# Patient Record
Sex: Female | Born: 1970
Health system: Southern US, Community
[De-identification: ages and names within clinical notes are randomized; demographics above are authoritative.]

## PROBLEM LIST (undated history)

## (undated) DIAGNOSIS — R768 Other specified abnormal immunological findings in serum: Secondary | ICD-10-CM

## (undated) DIAGNOSIS — K601 Chronic anal fissure: Secondary | ICD-10-CM

## (undated) DIAGNOSIS — J984 Other disorders of lung: Secondary | ICD-10-CM

## (undated) DIAGNOSIS — K219 Gastro-esophageal reflux disease without esophagitis: Secondary | ICD-10-CM

## (undated) DIAGNOSIS — N9489 Other specified conditions associated with female genital organs and menstrual cycle: Secondary | ICD-10-CM

## (undated) DIAGNOSIS — R7303 Prediabetes: Secondary | ICD-10-CM

## (undated) DIAGNOSIS — F419 Anxiety disorder, unspecified: Secondary | ICD-10-CM

## (undated) DIAGNOSIS — D649 Anemia, unspecified: Secondary | ICD-10-CM

## (undated) DIAGNOSIS — K602 Anal fissure, unspecified: Secondary | ICD-10-CM

## (undated) DIAGNOSIS — R0989 Other specified symptoms and signs involving the circulatory and respiratory systems: Secondary | ICD-10-CM

## (undated) DIAGNOSIS — R634 Abnormal weight loss: Secondary | ICD-10-CM

## (undated) DIAGNOSIS — Z87442 Personal history of urinary calculi: Secondary | ICD-10-CM

## (undated) DIAGNOSIS — F32A Depression, unspecified: Secondary | ICD-10-CM

## (undated) DIAGNOSIS — K645 Perianal venous thrombosis: Secondary | ICD-10-CM

## (undated) DIAGNOSIS — J432 Centrilobular emphysema: Secondary | ICD-10-CM

## (undated) DIAGNOSIS — F410 Panic disorder [episodic paroxysmal anxiety] without agoraphobia: Secondary | ICD-10-CM

## (undated) DIAGNOSIS — J45909 Unspecified asthma, uncomplicated: Secondary | ICD-10-CM

## (undated) DIAGNOSIS — K648 Other hemorrhoids: Secondary | ICD-10-CM

## (undated) DIAGNOSIS — D259 Leiomyoma of uterus, unspecified: Secondary | ICD-10-CM

## (undated) DIAGNOSIS — K649 Unspecified hemorrhoids: Secondary | ICD-10-CM

## (undated) HISTORY — PX: SIGMOIDOSCOPY: SUR1295

## (undated) HISTORY — PX: ADENOIDECTOMY: SUR15

---

## 2000-07-29 ENCOUNTER — Emergency Department (HOSPITAL_COMMUNITY): Admission: EM | Admit: 2000-07-29 | Discharge: 2000-07-29 | Payer: Self-pay | Admitting: Emergency Medicine

## 2005-06-24 ENCOUNTER — Ambulatory Visit: Payer: Self-pay | Admitting: Internal Medicine

## 2007-11-22 ENCOUNTER — Ambulatory Visit: Payer: Self-pay | Admitting: Internal Medicine

## 2010-08-06 ENCOUNTER — Ambulatory Visit: Payer: Self-pay | Admitting: Internal Medicine

## 2014-09-21 DIAGNOSIS — J449 Chronic obstructive pulmonary disease, unspecified: Secondary | ICD-10-CM

## 2014-09-21 DIAGNOSIS — O009 Unspecified ectopic pregnancy without intrauterine pregnancy: Secondary | ICD-10-CM

## 2014-09-21 HISTORY — DX: Chronic obstructive pulmonary disease, unspecified: J44.9

## 2014-09-21 HISTORY — DX: Unspecified ectopic pregnancy without intrauterine pregnancy: O00.90

## 2014-10-04 ENCOUNTER — Ambulatory Visit: Payer: Self-pay | Admitting: Oncology

## 2014-10-22 ENCOUNTER — Ambulatory Visit: Payer: Self-pay | Admitting: Oncology

## 2015-03-12 ENCOUNTER — Encounter: Payer: Self-pay | Admitting: Unknown Physician Specialty

## 2015-03-12 ENCOUNTER — Ambulatory Visit (INDEPENDENT_AMBULATORY_CARE_PROVIDER_SITE_OTHER): Payer: BLUE CROSS/BLUE SHIELD | Admitting: Unknown Physician Specialty

## 2015-03-12 VITALS — BP 98/64 | HR 76 | Temp 98.3°F | Ht 66.6 in | Wt 122.2 lb

## 2015-03-12 DIAGNOSIS — J0100 Acute maxillary sinusitis, unspecified: Secondary | ICD-10-CM

## 2015-03-12 MED ORDER — AMOXICILLIN 875 MG PO TABS: 875.0000 mg | ORAL_TABLET | Freq: Two times a day (BID) | ORAL | Status: DC

## 2015-03-12 NOTE — Progress Notes (Signed)
   BP 98/64 mmHg  Pulse 76  Temp(Src) 98.3 F (36.8 C)  Ht 5' 6.6" (1.692 m)  Wt 122 lb 3.2 oz (55.43 kg)  BMI 19.36 kg/m2  SpO2 99%   Subjective:    Patient ID: Lori Davies, female    DOB: [birth year], 44 y.o.   MRN: [id number]  HPI: Lori Davies is a 44 y.o. female  Chief Complaint  Patient presents with  . Facial Pain  . Sore Throat  . Sinusitis  . Nasal Congestion   Sore Throat  Associated symptoms include congestion and coughing. Pertinent negatives include no abdominal pain, diarrhea or ear pain.  Sinusitis Associated symptoms include congestion, coughing and a sore throat. Pertinent negatives include no ear pain.  URI  This is a new problem. The current episode started in the past 7 days. The problem has been gradually worsening. The maximum temperature recorded prior to her arrival was 101 - 101.9 F. The fever has been present for less than 1 day. Associated symptoms include congestion, coughing, nausea, a rash, sinus pain and a sore throat. Pertinent negatives include no abdominal pain, chest pain, diarrhea, dysuria or ear pain. She has tried NSAIDs for the symptoms. The treatment provided mild relief.     Relevant past medical, surgical, family and social history reviewed and updated as indicated. Interim medical history since our last visit reviewed. Allergies and medications reviewed and updated.  Review of Systems  HENT: Positive for congestion and sore throat. Negative for ear pain.   Respiratory: Positive for cough.   Cardiovascular: Negative for chest pain.  Gastrointestinal: Positive for nausea. Negative for abdominal pain and diarrhea.  Genitourinary: Negative for dysuria.  Skin: Positive for rash.    Per HPI unless specifically indicated above     Objective:    BP 98/64 mmHg  Pulse 76  Temp(Src) 98.3 F (36.8 C)  Ht 5' 6.6" (1.692 m)  Wt 122 lb 3.2 oz (55.43 kg)  BMI 19.36 kg/m2  SpO2 99%  Wt Readings from Last 3 Encounters:  03/12/15  122 lb 3.2 oz (55.43 kg)  01/17/15 118 lb (53.524 kg)    Physical Exam  Constitutional: She is oriented to person, place, and time. She appears well-developed and well-nourished. No distress.  HENT:  Head: Normocephalic and atraumatic.  Eyes: Conjunctivae and lids are normal. Right eye exhibits no discharge. Left eye exhibits no discharge. No scleral icterus.  Cardiovascular: Normal rate and regular rhythm.   Pulmonary/Chest: Effort normal. No respiratory distress.  Abdominal: Normal appearance. There is no splenomegaly or hepatomegaly.  Musculoskeletal: Normal range of motion.  Neurological: She is alert and oriented to person, place, and time.  Skin: Skin is intact. No rash noted. No pallor.  Psychiatric: She has a normal mood and affect. Her behavior is normal. Judgment and thought content normal.    No results found for this or any previous visit.    Assessment & Plan:   Problem List Items Addressed This Visit    None    Visit Diagnoses    Acute maxillary sinusitis, recurrence not specified    -  Primary    Relevant Medications    amoxicillin (AMOXIL) 875 MG tablet        Follow up plan: Return if symptoms worsen or fail to improve.

## 2015-03-12 NOTE — Patient Instructions (Signed)
You have a sinus infection. Take medicine as prescribed: Push fluids and plenty of rest. Nasal saline irrigation or neti pot to help drain sinuses. May use plain mucinex with plenty of fluid to help mobilize mucous. Please let us know if fever >101.5, trouble opening/closing mouth, difficulty swallowing, or worsening instead of improving as expected. Sinusitis Sinusitis is redness, soreness, and inflammation of the paranasal sinuses. Paranasal sinuses are air pockets within the bones of your face (beneath the eyes, the middle of the forehead, or above the eyes). In healthy paranasal sinuses, mucus is able to drain out, and air is able to circulate through them by way of your nose. However, when your paranasal sinuses are inflamed, mucus and air can become trapped. This can allow bacteria and other germs to grow and cause infection. Sinusitis can develop quickly and last only a short time (acute) or continue over a long period (chronic). Sinusitis that lasts for more than 12 weeks is considered chronic.  CAUSES  Causes of sinusitis include:  Allergies.  Structural abnormalities, such as displacement of the cartilage that separates your nostrils (deviated septum), which can decrease the air flow through your nose and sinuses and affect sinus drainage.  Functional abnormalities, such as when the small hairs (cilia) that line your sinuses and help remove mucus do not work properly or are not present. SIGNS AND SYMPTOMS  Symptoms of acute and chronic sinusitis are the same. The primary symptoms are pain and pressure around the affected sinuses. Other symptoms include:  Upper toothache.  Earache.  Headache.  Bad breath.  Decreased sense of smell and taste.  A cough, which worsens when you are lying flat.  Fatigue.  Fever.  Thick drainage from your nose, which often is green and may contain pus (purulent).  Swelling and warmth over the affected sinuses. DIAGNOSIS  Your health care  provider will perform a physical exam. During the exam, your health care provider may:  Look in your nose for signs of abnormal growths in your nostrils (nasal polyps).  Tap over the affected sinus to check for signs of infection.  View the inside of your sinuses (endoscopy) using an imaging device that has a light attached (endoscope). If your health care provider suspects that you have chronic sinusitis, one or more of the following tests may be recommended:  Allergy tests.  Nasal culture. A sample of mucus is taken from your nose, sent to a lab, and screened for bacteria.  Nasal cytology. A sample of mucus is taken from your nose and examined by your health care provider to determine if your sinusitis is related to an allergy. TREATMENT  Most cases of acute sinusitis are related to a viral infection and will resolve on their own within 10 days. Sometimes medicines are prescribed to help relieve symptoms (pain medicine, decongestants, nasal steroid sprays, or saline sprays).  However, for sinusitis related to a bacterial infection, your health care provider will prescribe antibiotic medicines. These are medicines that will help kill the bacteria causing the infection.  Rarely, sinusitis is caused by a fungal infection. In theses cases, your health care provider will prescribe antifungal medicine. For some cases of chronic sinusitis, surgery is needed. Generally, these are cases in which sinusitis recurs more than 3 times per year, despite other treatments. HOME CARE INSTRUCTIONS   Drink plenty of water. Water helps thin the mucus so your sinuses can drain more easily.  Use a humidifier.  Inhale steam 3 to 4 times a day (for  example, sit in the bathroom with the shower running).  Apply a warm, moist washcloth to your face 3 to 4 times a day, or as directed by your health care provider.  Use saline nasal sprays to help moisten and clean your sinuses.  Take medicines only as directed by  your health care provider.  If you were prescribed either an antibiotic or antifungal medicine, finish it all even if you start to feel better. SEEK IMMEDIATE MEDICAL CARE IF:  You have increasing pain or severe headaches.  You have nausea, vomiting, or drowsiness.  You have swelling around your face.  You have vision problems.  You have a stiff neck.  You have difficulty breathing. MAKE SURE YOU:   Understand these instructions.  Will watch your condition.  Will get help right away if you are not doing well or get worse. Document Released: 09/07/2005 Document Revised: 01/22/2014 Document Reviewed: 09/22/2011 [facility] Patient Information 2015 [place], [place]. This information is not intended to replace advice given to you by your health care provider. Make sure you discuss any questions you have with your health care provider.

## 2015-06-05 ENCOUNTER — Encounter: Payer: Self-pay | Admitting: Family Medicine

## 2015-06-05 ENCOUNTER — Ambulatory Visit (INDEPENDENT_AMBULATORY_CARE_PROVIDER_SITE_OTHER): Payer: BLUE CROSS/BLUE SHIELD | Admitting: Family Medicine

## 2015-06-05 VITALS — BP 95/63 | HR 75 | Temp 98.1°F | Ht 67.0 in | Wt 126.0 lb

## 2015-06-05 DIAGNOSIS — R0789 Other chest pain: Secondary | ICD-10-CM | POA: Diagnosis not present

## 2015-06-05 DIAGNOSIS — J209 Acute bronchitis, unspecified: Secondary | ICD-10-CM

## 2015-06-05 MED ORDER — AMOXICILLIN 875 MG PO TABS: 875.0000 mg | ORAL_TABLET | Freq: Two times a day (BID) | ORAL | Status: DC

## 2015-06-05 MED ORDER — ALBUTEROL SULFATE HFA 108 (90 BASE) MCG/ACT IN AERS: 2.0000 | INHALATION_SPRAY | RESPIRATORY_TRACT | Status: DC | PRN

## 2015-06-05 MED ORDER — PREDNISONE 20 MG PO TABS: ORAL_TABLET | ORAL | Status: DC

## 2015-06-05 NOTE — Progress Notes (Signed)
BP 95/63 mmHg  Pulse 75  Temp(Src) 98.1 F (36.7 C)  Ht 5\' 7"  (1.702 m)  Wt 126 lb (57.153 kg)  BMI 19.73 kg/m2  SpO2 98%   Subjective:    Patient ID: Lori Davies, female    DOB: 1971/08/15, 44 y.o.   MRN: [id number]  HPI: Lori Davies is a 44 y.o. female  Chief Complaint  Patient presents with  . Chest Pain    heavy with SOB, hears wheezing, throat burns   She started to get sick 3 days ago; does bring some phlegm up sometimes; bittery taste; no blood Shortness of breath, a lot; heard wheezing No childhood asthma; no pneumonia; no rash, no sore throat, little irritated; ears are okay; sinus are congested She has usually been helped by prednisone and antibiotics She has had to use Mother-in-law has been sick with similar symptoms Patient has not had any fevers; the last 3 days, she has been pouring sweat, might have been running in the middle of hte night Does not tolerate doxycycline, nausea Augmentin makes her sick too z-pak does not help patient She does not think she's having a heart attack; just has this tightness in her chest; pulling her shoulders back feels a little better, opens her up; no risk factors for MI; does not have diabetes, high cholesterol, hypertension; remote smoking history  Relevant past medical, surgical, family and social history reviewed and updated as indicated. Interim medical history since our last visit reviewed. Allergies and medications reviewed and updated.  Review of Systems  Per HPI unless specifically indicated above     Objective:    BP 95/63 mmHg  Pulse 75  Temp(Src) 98.1 F (36.7 C)  Ht 5\' 7"  (1.702 m)  Wt 126 lb (57.153 kg)  BMI 19.73 kg/m2  SpO2 98%  Wt Readings from Last 3 Encounters:  06/05/15 126 lb (57.153 kg)  03/12/15 122 lb 3.2 oz (55.43 kg)  01/17/15 118 lb (53.524 kg)    Physical Exam  Constitutional: She appears well-developed and well-nourished. No distress.  HENT:  Head: Normocephalic and  atraumatic.  Right Ear: Hearing, external ear and ear canal normal. Tympanic membrane is not injected and not erythematous. No middle ear effusion.  Left Ear: Hearing, tympanic membrane, external ear and ear canal normal. Tympanic membrane is not injected and not erythematous.  No middle ear effusion.  Nose: No rhinorrhea or nasal deformity.  Mouth/Throat: Mucous membranes are normal. Posterior oropharyngeal erythema (mild) present. No oropharyngeal exudate or posterior oropharyngeal edema.  Eyes: EOM are normal. No scleral icterus.  Neck: No thyromegaly present.  Cardiovascular: Normal rate, regular rhythm and normal heart sounds.   No murmur heard. Pulmonary/Chest: Effort normal. No accessory muscle usage. No respiratory distress. She has decreased breath sounds (mildly decreased breath sounds, symmetric). She has wheezes (faint expiratory wheezes). She exhibits tenderness and bony tenderness. She exhibits no deformity and no retraction.  Abdominal: Soft. Bowel sounds are normal. She exhibits no distension.  Musculoskeletal: Normal range of motion. She exhibits no edema.  Lymphadenopathy:    She has no cervical adenopathy.    She has no axillary adenopathy.  Neurological: She is alert. She exhibits normal muscle tone.  Skin: Skin is warm and dry. No rash noted. She is not diaphoretic. No pallor.  Psychiatric: She has a normal mood and affect. Her behavior is normal. Judgment and thought content normal.    No results found for this or any previous visit.    Assessment &  Plan:   Problem List Items Addressed This Visit      Respiratory   Bronchitis, acute, with bronchospasm - Primary    will treat with prednisone, antibiotics, SABA; rest, hydration, etc, see AVS; opted again CXR to reduce lifetime radiation exposure        Other   Chest pain, atypical    tender to pressure along anterior chest wall; sounds completely related to lung disease; see above          Follow up  plan: Return if symptoms worsen or fail to improve.

## 2015-06-05 NOTE — Assessment & Plan Note (Signed)
will treat with prednisone, antibiotics, SABA; rest, hydration, etc, see AVS; opted again CXR to reduce lifetime radiation exposure

## 2015-06-05 NOTE — Assessment & Plan Note (Signed)
tender to pressure along anterior chest wall; sounds completely related to lung disease; see above

## 2015-06-05 NOTE — Patient Instructions (Signed)
Let's start the prednisone and antibiotic and inhaler Please do eat yogurt daily or take a probiotic daily for the next month or two We want to replace the healthy germs in the gut If you notice foul, watery diarrhea in the next two months, schedule an appointment RIGHT AWAY Try vitamin C (orange juice if not diabetic or vitamin C tablets) and drink green tea to help your immune system during your illness Get plenty of rest and hydration Call if needed

## 2015-06-06 ENCOUNTER — Telehealth: Payer: Self-pay | Admitting: Family Medicine

## 2015-06-06 NOTE — Telephone Encounter (Signed)
Called by call a nurse, and called patient back to discuss symptoms. She notes that she started her prednisone today, felt really sick on her stomach. Then a couple of hours later, had numbness in both her hands then had tingling sensation in both her hands that went up her arms to her elbows- no further. She feels like it has gotten better over the past couple of hours, but it's still there. She feels like if she grips, the numbness goes away, but then if she sits normally it starts to come back. She notes that it has been going on for 2.5 hours now, starting to get better. No SOB, no rashes, No CP, No visual changes, No lightheadedness. She is otherwise feeling well and doing much better with her breathing from yesterday. Advised patient of warning signs including symptoms getting worse for which she should call back and go to the ER. She thinks that it is a side effect of the prednisone, and I agree. Will monitor, and check back in in AM. Does not need to go to the ER at this moment. Will call back tomorrow AM to see how she is doing.

## 2015-06-07 NOTE — Telephone Encounter (Signed)
Numbness and tingling seemed like it disappeared when she used her inhaler last night and she is better now. Felt so much better with the prednisone in her chest. Wants to know if she can take 20mg  spread out over a couple of hours over the day to avoid reaction and open chest. Advised her to take 20mg , if no reaction OK to take another 20, but if numbness and tingling returns, do not take another one. She is aware and will call with any other concerns.

## 2015-06-07 NOTE — Telephone Encounter (Signed)
Pt called stated she did not like the side effects of the medication but notices the tightness in her chest returns when she does not take the prednisone. Pt would like to know if it would be ok to take one pill in the morning, afternoon, and evening rather than taking the 3 pills together. Please contact the pt to advise. Thanks.

## 2015-06-07 NOTE — Telephone Encounter (Signed)
FYI- called by your patient last night and followed up this AM

## 2015-06-12 ENCOUNTER — Ambulatory Visit
Admission: RE | Admit: 2015-06-12 | Discharge: 2015-06-12 | Disposition: A | Discharge status: Home / Self Care | Payer: BLUE CROSS/BLUE SHIELD | Source: Ambulatory Visit | Attending: Family Medicine | Admitting: Family Medicine

## 2015-06-12 ENCOUNTER — Other Ambulatory Visit
Admission: RE | Admit: 2015-06-12 | Discharge: 2015-06-12 | Disposition: A | Discharge status: Home / Self Care | Payer: BLUE CROSS/BLUE SHIELD | Source: Ambulatory Visit | Attending: Family Medicine | Admitting: Family Medicine

## 2015-06-12 ENCOUNTER — Ambulatory Visit: Payer: BLUE CROSS/BLUE SHIELD

## 2015-06-12 ENCOUNTER — Ambulatory Visit (INDEPENDENT_AMBULATORY_CARE_PROVIDER_SITE_OTHER): Payer: BLUE CROSS/BLUE SHIELD | Admitting: Family Medicine

## 2015-06-12 ENCOUNTER — Encounter: Payer: Self-pay | Admitting: Family Medicine

## 2015-06-12 VITALS — BP 105/69 | HR 65 | Temp 98.8°F | Wt 127.0 lb

## 2015-06-12 DIAGNOSIS — R499 Unspecified voice and resonance disorder: Secondary | ICD-10-CM

## 2015-06-12 DIAGNOSIS — R0600 Dyspnea, unspecified: Secondary | ICD-10-CM | POA: Diagnosis not present

## 2015-06-12 DIAGNOSIS — J029 Acute pharyngitis, unspecified: Secondary | ICD-10-CM | POA: Diagnosis not present

## 2015-06-12 DIAGNOSIS — R0789 Other chest pain: Secondary | ICD-10-CM | POA: Insufficient documentation

## 2015-06-12 DIAGNOSIS — J209 Acute bronchitis, unspecified: Secondary | ICD-10-CM

## 2015-06-12 LAB — CBC WITH DIFFERENTIAL/PLATELET
Hematocrit: 43.6 % (ref 34.0–46.6)
Hemoglobin: 15.3 g/dL (ref 11.1–15.9)
LYMPHS: 26 %
Lymphocytes Absolute: 3.4 10*3/uL — ABNORMAL HIGH (ref 0.7–3.1)
MCH: 30.7 pg (ref 26.6–33.0)
MCHC: 35.1 g/dL (ref 31.5–35.7)
MCV: 88 fL (ref 79–97)
MID (Absolute): 0.9 10*3/uL (ref 0.1–1.6)
MID: 7 %
NEUTROS ABS: 8.7 10*3/uL — AB (ref 1.4–7.0)
NEUTROS PCT: 68 %
Platelets: 360 10*3/uL (ref 150–379)
RBC: 4.98 x10E6/uL (ref 3.77–5.28)
RDW: 13.3 % (ref 12.3–15.4)
WBC: 13 10*3/uL — ABNORMAL HIGH (ref 3.4–10.8)

## 2015-06-12 LAB — FIBRIN DERIVATIVES D-DIMER (ARMC ONLY): Fibrin derivatives D-dimer (ARMC): 210.39 (ref 0–499)

## 2015-06-12 MED ORDER — MONTELUKAST SODIUM 10 MG PO TABS: 10.0000 mg | ORAL_TABLET | Freq: Every day | ORAL | Status: DC

## 2015-06-12 MED ORDER — FEXOFENADINE HCL 180 MG PO TABS: 180.0000 mg | ORAL_TABLET | Freq: Every day | ORAL | Status: DC

## 2015-06-12 MED ORDER — OMEPRAZOLE 40 MG PO CPDR
40.0000 mg | DELAYED_RELEASE_CAPSULE | Freq: Every day | ORAL | Status: DC
Start: 2015-06-12 — End: 2015-10-30

## 2015-06-12 MED ORDER — FLUCONAZOLE 150 MG PO TABS: 150.0000 mg | ORAL_TABLET | Freq: Once | ORAL | Status: DC

## 2015-06-12 NOTE — Assessment & Plan Note (Addendum)
Chest xray and EKG ordered today; she did not respond to other therapy (amoxicillin, SABA, and prednisone)

## 2015-06-12 NOTE — Progress Notes (Signed)
BP 105/69 mmHg  Pulse 65  Temp(Src) 98.8 F (37.1 C)  Wt 127 lb (57.607 kg)  SpO2 97%  Subjective:    Patient ID: Lori Davies, female    DOB: May 02, 1971, 44 y.o.   MRN: [id number]  HPI: Lori Davies is a 44 y.o. female  Chief Complaint  Patient presents with  . URI    still feels like she "can't breathe" and that her chest is tight. Also states she feels like there is something in her throat even though she knows nothing is in there.   She says her chest is still stopped up and she is having to gasp to breathe; her throat is really irritated; she does not know why, not better She has not been on long car rides or plane trips; no hx of DVT or PE She is coughing, but nothing coming up Does feel some pressure; if she gets up and walks for a long distance then she does feel pressure; feels like something is taking  She took prednisone, albuterol, and amoxicillin She had some pins and needles running through her arms; she spaced the prednisone apart and that helped; she has one left, but did not resolve the problem No nausea No fevers Having night sweats, wakes up during the night sweating Throat is almost really really irritated; she declines strep testing; has already been treated with amoxicillin No change in voice She used to smoke, quit a few weeks ago Aunt had blood clot; son has allergies Mother has thyroid issues Dog in the house, in the bed with her; that is relatively new; no cats  Relevant past medical, surgical, family and social history reviewed and updated as indicated. Interim medical history since our last visit reviewed. Allergies and medications reviewed and updated.  Review of Systems Per HPI unless specifically indicated above     Objective:    BP 105/69 mmHg  Pulse 65  Temp(Src) 98.8 F (37.1 C)  Wt 127 lb (57.607 kg)  SpO2 97%  Wt Readings from Last 3 Encounters:  06/12/15 127 lb (57.607 kg)  06/05/15 126 lb (57.153 kg)  03/12/15 122 lb 3.2 oz  (55.43 kg)    Physical Exam  Constitutional: She appears well-developed and well-nourished. No distress.  HENT:  Head: Normocephalic and atraumatic.  Eyes: EOM are normal. No scleral icterus.  Neck: Neck supple. No JVD present. No tracheal tenderness present. No tracheal deviation present. No thyroid mass and no thyromegaly present.  Cardiovascular: Normal rate, regular rhythm and normal heart sounds.   No extrasystoles are present. Exam reveals no gallop and no distant heart sounds.   No murmur heard. Pulmonary/Chest: Effort normal and breath sounds normal. No accessory muscle usage or stridor. No respiratory distress. She has no decreased breath sounds. She has no wheezes. She has no rhonchi. She has no rales. She exhibits no deformity and no retraction.  Abdominal: Soft. Bowel sounds are normal. She exhibits no distension.  Musculoskeletal: Normal range of motion. She exhibits no edema (negative Homan's).  Neurological: She is alert. She exhibits normal muscle tone.  Skin: Skin is warm and dry. She is not diaphoretic. No pallor.  Psychiatric: She has a normal mood and affect. Her behavior is normal. Judgment and thought content normal.   No results found for this or any previous visit.    Assessment & Plan:   Problem List Items Addressed This Visit      Respiratory   Bronchitis, acute, with bronchospasm    Chest  xray and EKG ordered today; she did not respond to other therapy (amoxicillin, SABA, and prednisone)      Relevant Orders   CBC With Differential/Platelet   Spirometry with graph (Completed)     Other   Chest pain, atypical - Primary    EKG today did not show anything worrisome; NSR, normal axis, no ST-T wave changes; no S3Q3T3 pattern to suggest PE; will get D-dimer chest xray      Relevant Orders   EKG 12-Lead (Completed)   CBC With Differential/Platelet   Basic metabolic panel   CT Angio Chest PE W/Cm &/Or Wo Cm   Allergen, Dog Dander, e5   CT Angio Chest PE  W/Cm &/Or Wo Cm   DG Chest 2 View   D-dimer, quantitative (not at [facility])   Dyspnea    Will get stat D-dimer, CXR; did not respond to prednisone; ddx still includes allergic response; will get allergen panel, IgE; consider singulair plus inhaled corticosteroid plus antihistamine once CXR and D-dimer are back      Relevant Orders   Allergen, Dog Dander, e5   CT Angio Chest PE W/Cm &/Or Wo Cm   Allergen Panel (27) + IGE   DG Chest 2 View   D-dimer, quantitative (not at [facility])   Spirometry with graph (Completed)    Other Visit Diagnoses    Change in voice        Relevant Orders    TSH    Allergen, Dog Dander, e5    CT Angio Chest PE W/Cm &/Or Wo Cm    Allergen Panel (27) + IGE    DG Chest 2 View       Follow up plan: No Follow-up on file.  Ardyth Gal, nurse reviewer; he will not approve the chest CT "challenging" to approve; will need to get D-dimer I am sending patient to the hospital for a stat D-dimer and CXR, with call report to me Discussed case with Dr. Wynetta Emery as well; she reviewed spirometry, showing mild restriction Consider possible candidal infection as cause of sore throat; will treat with single dose of Diflucan If symptoms persist, consider echo and/or referral to pulmonologist Face-to-face time with patient was more than 40 minutes, >50% time spent counseling and coordination of care

## 2015-06-12 NOTE — Assessment & Plan Note (Addendum)
EKG today did not show anything worrisome; NSR, normal axis, no ST-T wave changes; no S3Q3T3 pattern to suggest PE; will get D-dimer chest xray

## 2015-06-12 NOTE — Assessment & Plan Note (Signed)
Will get stat D-dimer, CXR; did not respond to prednisone; ddx still includes allergic response; will get allergen panel, IgE; consider singulair plus inhaled corticosteroid plus antihistamine once CXR and D-dimer are back

## 2015-06-12 NOTE — Patient Instructions (Addendum)
We will get the labs and xray of your chest today to look for a cause of your symptoms We'll talk by phone about the results; do not leave the hospital until we talk We'll have more information back tomorrow as well with lab results Follow-up for your symptoms on Monday, but call sooner if needed Seek medical care at the ER or urgent care at night or over the weekend if your symptoms become severe Avoid all passive smoke Keep the dog out of the bedroom, and change sheets

## 2015-06-13 LAB — TSH: TSH: 2.99 u[IU]/mL (ref 0.450–4.500)

## 2015-06-13 LAB — BASIC METABOLIC PANEL
BUN / CREAT RATIO: 15 (ref 9–23)
BUN: 12 mg/dL (ref 6–24)
CO2: 21 mmol/L (ref 18–29)
CREATININE: 0.82 mg/dL (ref 0.57–1.00)
Calcium: 9.9 mg/dL (ref 8.7–10.2)
Chloride: 102 mmol/L (ref 97–108)
GFR, EST AFRICAN AMERICAN: 100 mL/min/{1.73_m2} (ref >59)
GFR, EST NON AFRICAN AMERICAN: 87 mL/min/{1.73_m2} (ref >59)
GLUCOSE: 87 mg/dL (ref 65–99)
Potassium: 3.9 mmol/L (ref 3.5–5.2)
SODIUM: 141 mmol/L (ref 134–144)

## 2015-06-15 LAB — ALLERGEN PANEL (27) + IGE
Bahia Grass IgE: 5.1 kU/L — AB
Bermuda Grass IgE: 1.7 kU/L — AB
Cockroach, American IgE: <0.1 kU/L
Common Silver Birch IgE: <0.1 kU/L
D Farinae IgE: <0.1 kU/L
D Pteronyssinus IgE: <0.1 kU/L
Dog Dander IgE: 0.49 kU/L — AB
Elm, American IgE: <0.1 kU/L
G008-IGE BLUEGRASS, KENTUCK: 11.4 kU/L — AB
Hickory, White IgE: <0.1 kU/L
IgE (Immunoglobulin E), Serum: 126 IU/mL — ABNORMAL HIGH (ref 0–100)
Johnson Grass IgE: 2.74 kU/L — AB
Maple/Box Elder IgE: <0.1 kU/L
Oak, White IgE: <0.1 kU/L
Penicillium Chrysogen IgE: <0.1 kU/L
Pigweed, Rough IgE: <0.1 kU/L
Ragweed, Short IgE: <0.1 kU/L
TIMOTHY IGE: 7.61 kU/L — AB

## 2015-06-17 ENCOUNTER — Ambulatory Visit (INDEPENDENT_AMBULATORY_CARE_PROVIDER_SITE_OTHER): Payer: BLUE CROSS/BLUE SHIELD | Admitting: Family Medicine

## 2015-06-17 ENCOUNTER — Encounter: Payer: Self-pay | Admitting: Family Medicine

## 2015-06-17 ENCOUNTER — Other Ambulatory Visit: Payer: BLUE CROSS/BLUE SHIELD

## 2015-06-17 VITALS — BP 105/72 | HR 78 | Temp 98.3°F | Wt 129.0 lb

## 2015-06-17 DIAGNOSIS — J984 Other disorders of lung: Secondary | ICD-10-CM

## 2015-06-17 DIAGNOSIS — R0989 Other specified symptoms and signs involving the circulatory and respiratory systems: Secondary | ICD-10-CM | POA: Insufficient documentation

## 2015-06-17 DIAGNOSIS — R0600 Dyspnea, unspecified: Secondary | ICD-10-CM | POA: Diagnosis not present

## 2015-06-17 DIAGNOSIS — R760 Raised antibody titer: Secondary | ICD-10-CM | POA: Diagnosis not present

## 2015-06-17 DIAGNOSIS — R768 Other specified abnormal immunological findings in serum: Secondary | ICD-10-CM | POA: Insufficient documentation

## 2015-06-17 DIAGNOSIS — J3081 Allergic rhinitis due to animal (cat) (dog) hair and dander: Secondary | ICD-10-CM | POA: Diagnosis not present

## 2015-06-17 MED ORDER — FLUTICASONE PROPIONATE HFA 110 MCG/ACT IN AERO: 2.0000 | INHALATION_SPRAY | Freq: Two times a day (BID) | RESPIRATORY_TRACT | Status: DC

## 2015-06-17 NOTE — Assessment & Plan Note (Signed)
Continue anti-histamine and montelukast; considered referral to allergy/immunology but I believe we can get her in to see pulmonologist faster and she has restrictive pattern on spirometry and hyperinflation of lungs on CXR so I want her to see pulmonologist anyway; AVOID triggers; glad to hear dog is out of the bedroom and she has changed sheets

## 2015-06-17 NOTE — Assessment & Plan Note (Addendum)
Improved but still persistent; refer to pulmonologist; breathing treatment (albuterol) given here in the office at patient's request; reviewed negative D-dimer, CXR which showed hyperinflation of lungs and apical scarring; previous spirometry showed restrictive lung pattern with advanced lung age; discussed getting an echocardiogram of the heart with patient and will order today

## 2015-06-17 NOTE — Assessment & Plan Note (Signed)
On spirometry, but with hyperinflation of lungs on CXR; refer to pulmonologist

## 2015-06-17 NOTE — Assessment & Plan Note (Signed)
Continue montelukast and anti-histamine; refer to pulmonologist

## 2015-06-17 NOTE — Progress Notes (Signed)
BP 105/72 mmHg  Pulse 78  Temp(Src) 98.3 F (36.8 C)  Wt 129 lb (58.514 kg)  SpO2 99%   Subjective:    Patient ID: Lori Davies, female    DOB: March 16, 1971, 44 y.o.   MRN: [id number]  HPI: Lori Davies is a 44 y.o. female  Chief Complaint  Patient presents with  . Follow-up    Still having some SOB with activity   She was over at her mom's and went back up two short flights of steps and got very short of breath;  Sheets have been washed; dog is out of the bed She is taking Allegra; taking montelukast Not having itchy watery eyes She took the diflucan; throat is not sore any more; still feels like something is in there She would really like to try a breathing treatment  Relevant past medical, surgical, family and social history reviewed and updated as indicated. Interim medical history since our last visit reviewed. Allergies and medications reviewed and updated.  Review of Systems Per HPI unless specifically indicated above     Objective:    BP 105/72 mmHg  Pulse 78  Temp(Src) 98.3 F (36.8 C)  Wt 129 lb (58.514 kg)  SpO2 99%  Wt Readings from Last 3 Encounters:  06/17/15 129 lb (58.514 kg)  06/12/15 127 lb (57.607 kg)  06/05/15 126 lb (57.153 kg)    Physical Exam  Constitutional: She appears well-developed and well-nourished.  HENT:  Head: Normocephalic and atraumatic.  Eyes: EOM are normal. No scleral icterus.  Neck: Neck supple. No JVD present. No tracheal deviation present. No thyromegaly present.  Cardiovascular: Normal rate and regular rhythm.   No extrasystoles are present.  Pulmonary/Chest: Effort normal and breath sounds normal. No accessory muscle usage or stridor. No tachypnea. No respiratory distress. She has no decreased breath sounds. She has no wheezes. She has no rhonchi. She has no rales.  Musculoskeletal: She exhibits no edema.  No calf swelling or tenderness  Lymphadenopathy:    She has no cervical adenopathy.  Skin: No rash noted. No  erythema.  No pallor, no jaundice  Psychiatric: She has a normal mood and affect. Her speech is normal and behavior is normal. Thought content normal.    Results for orders placed or performed during the hospital encounter of 06/12/15  Fibrin derivatives D-Dimer ([facility] only)  Result Value Ref Range   Fibrin derivatives D-dimer ([facility]) 210.39 0 - 499       Assessment & Plan:   Problem List Items Addressed This Visit      Respiratory   Restrictive lung disease    On spirometry, but with hyperinflation of lungs on CXR; refer to pulmonologist      Relevant Orders   Ambulatory referral to Pulmonology     Other   Dyspnea    Improved but still persistent; refer to pulmonologist; breathing treatment (albuterol) given here in the office at patient's request; reviewed negative D-dimer, CXR which showed hyperinflation of lungs and apical scarring; previous spirometry showed restrictive lung pattern with advanced lung age; discussed getting an echocardiogram of the heart with patient and will order today      Relevant Orders   Echocardiogram   Elevated IgE level - Primary    Continue montelukast and anti-histamine; refer to pulmonologist      Relevant Orders   Ambulatory referral to Pulmonology   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    Continue anti-histamine and montelukast; considered referral to allergy/immunology but I believe we  can get her in to see pulmonologist faster and she has restrictive pattern on spirometry and hyperinflation of lungs on CXR so I want her to see pulmonologist anyway; AVOID triggers; glad to hear dog is out of the bedroom and she has changed sheets      Relevant Orders   Ambulatory referral to Pulmonology   Hyperinflation of lungs    Noted on CXR, with advanced lung age on spirometry; start inhaled corticosteroid (but stop if sore throat) and refer to pulmonologist      Relevant Orders   Ambulatory referral to Pulmonology      Follow up plan: No Follow-up on  file.  Meds ordered this encounter  Medications  . fluticasone (FLOVENT HFA) 110 MCG/ACT inhaler    Sig: Inhale 2 puffs into the lungs 2 (two) times daily. Rinse mouth out/gargle after each use    Dispense:  1 Inhaler    Refill:  1   Orders Placed This Encounter  Procedures  . Ambulatory referral to Pulmonology  . Echocardiogram

## 2015-06-17 NOTE — Assessment & Plan Note (Addendum)
Noted on CXR, with advanced lung age on spirometry; start inhaled corticosteroid (but stop if sore throat) and refer to pulmonologist; breathing treatment given in office (albuterol) but patient did not notice much improvement

## 2015-06-17 NOTE — Patient Instructions (Signed)
Will get echo Will refer to pulmonologist Go to ER if worse Start new inhaler Continue other meds

## 2015-08-09 ENCOUNTER — Encounter: Payer: Self-pay | Admitting: Family Medicine

## 2015-08-09 ENCOUNTER — Ambulatory Visit (INDEPENDENT_AMBULATORY_CARE_PROVIDER_SITE_OTHER): Payer: BLUE CROSS/BLUE SHIELD | Admitting: Family Medicine

## 2015-08-09 VITALS — BP 113/78 | HR 73 | Temp 98.9°F | Wt 132.0 lb

## 2015-08-09 DIAGNOSIS — J209 Acute bronchitis, unspecified: Secondary | ICD-10-CM | POA: Diagnosis not present

## 2015-08-09 MED ORDER — AZITHROMYCIN 250 MG PO TABS: ORAL_TABLET | ORAL | Status: DC

## 2015-08-09 MED ORDER — PREDNISONE 10 MG PO TABS: ORAL_TABLET | ORAL | Status: DC

## 2015-08-09 NOTE — Progress Notes (Signed)
BP 113/78 mmHg  Pulse 73  Temp(Src) 98.9 F (37.2 C)  Wt 132 lb (59.875 kg)  SpO2 98%   Subjective:    Patient ID: Lori Davies, female    DOB: [birth year], 44 y.o.   MRN: [id number]  HPI: Lori Davies is a 44 y.o. female  Chief Complaint  Patient presents with  . URI    X 3 days   UPPER RESPIRATORY TRACT INFECTION Duration: x 3 days Worst symptom: chest tightness Fever: yes Cough: yes Shortness of breath: yes Wheezing: yes Chest pain: no Chest tightness: yes Chest congestion: yes Nasal congestion: yes Runny nose: no Post nasal drip: yes Sneezing: no Sore throat: no Swollen glands: no Sinus pressure: no Headache: yes Face pain: no Toothache: no Ear pain: no  Ear pressure: no  Eyes red/itching:no Eye drainage/crusting: no  Vomiting: no Rash: no Fatigue: yes Sick contacts: yes Strep contacts: no  Context: stable Recurrent sinusitis: no Relief with OTC cold/cough medications: no  Treatments attempted: mucinex   Relevant past medical, surgical, family and social history reviewed and updated as indicated. Interim medical history since our last visit reviewed. Allergies and medications reviewed and updated.  Review of Systems  Constitutional: Negative.   HENT: Negative.   Respiratory: Negative.   Cardiovascular: Negative.   Psychiatric/Behavioral: Negative.     Per HPI unless specifically indicated above     Objective:    BP 113/78 mmHg  Pulse 73  Temp(Src) 98.9 F (37.2 C)  Wt 132 lb (59.875 kg)  SpO2 98%  Wt Readings from Last 3 Encounters:  08/09/15 132 lb (59.875 kg)  06/17/15 129 lb (58.514 kg)  06/12/15 127 lb (57.607 kg)    Physical Exam  Constitutional: She is oriented to person, place, and time. She appears well-developed and well-nourished. No distress.  HENT:  Head: Normocephalic and atraumatic.  Right Ear: Hearing, tympanic membrane, external ear and ear canal normal.  Left Ear: Hearing, tympanic membrane, external ear and  ear canal normal.  Nose: Mucosal edema and rhinorrhea present. No nose lacerations, sinus tenderness, nasal deformity, septal deviation or nasal septal hematoma. No epistaxis.  No foreign bodies. Right sinus exhibits no maxillary sinus tenderness and no frontal sinus tenderness. Left sinus exhibits no maxillary sinus tenderness and no frontal sinus tenderness.  Mouth/Throat: Uvula is midline and mucous membranes are normal. Posterior oropharyngeal erythema present. No oropharyngeal exudate, posterior oropharyngeal edema or tonsillar abscesses.  Eyes: Conjunctivae, EOM and lids are normal. Pupils are equal, round, and reactive to light. Right eye exhibits no discharge. Left eye exhibits no discharge. No scleral icterus.  Neck: Normal range of motion. Neck supple. No JVD present. No tracheal deviation present. No thyromegaly present.  Cardiovascular: Normal rate, regular rhythm, normal heart sounds and intact distal pulses.  Exam reveals no gallop and no friction rub.   No murmur heard. Pulmonary/Chest: Effort normal. No stridor. No respiratory distress. She has decreased breath sounds in the right upper field, the right middle field, the right lower field, the left upper field, the left middle field and the left lower field. She has wheezes in the right lower field and the left lower field. She has no rhonchi. She has no rales. She exhibits no tenderness.  Musculoskeletal: Normal range of motion.  Lymphadenopathy:    She has cervical adenopathy.  Neurological: She is alert and oriented to person, place, and time.  Skin: Skin is warm, dry and intact. No rash noted. She is not diaphoretic. No erythema. No pallor.  Psychiatric: She has a normal mood and affect. Her speech is normal and behavior is normal. Judgment and thought content normal. Cognition and memory are normal.  Nursing note and vitals reviewed.   Results for orders placed or performed during the hospital encounter of 06/12/15  Fibrin  derivatives D-Dimer ([facility] only)  Result Value Ref Range   Fibrin derivatives D-dimer ([facility]) 210.39 0 - 499      Assessment & Plan:   Problem List Items Addressed This Visit    None    Visit Diagnoses    Bronchitis, acute, with bronchospasm    -  Primary    Will treat with prednisone and z-pack for inflammation. She will let us know if not getting better or getting worse. Rest and of fluids. 2 week lung check        Follow up plan: Return in about 2 weeks (around 08/23/2015) for Lung recheck.

## 2015-08-21 ENCOUNTER — Ambulatory Visit (INDEPENDENT_AMBULATORY_CARE_PROVIDER_SITE_OTHER): Payer: BLUE CROSS/BLUE SHIELD | Admitting: Unknown Physician Specialty

## 2015-08-21 ENCOUNTER — Encounter: Payer: Self-pay | Admitting: Unknown Physician Specialty

## 2015-08-21 VITALS — BP 108/75 | HR 84 | Temp 98.1°F | Ht 67.0 in | Wt 132.0 lb

## 2015-08-21 DIAGNOSIS — J0100 Acute maxillary sinusitis, unspecified: Secondary | ICD-10-CM

## 2015-08-21 MED ORDER — PREDNISONE 20 MG PO TABS: 20.0000 mg | ORAL_TABLET | Freq: Every day | ORAL | Status: DC

## 2015-08-21 MED ORDER — AMOXICILLIN 875 MG PO TABS: 875.0000 mg | ORAL_TABLET | Freq: Two times a day (BID) | ORAL | Status: DC

## 2015-08-21 NOTE — Progress Notes (Signed)
   BP 108/75 mmHg  Pulse 84  Temp(Src) 98.1 F (36.7 C)  Ht 5\' 7"  (1.702 m)  Wt 132 lb (59.875 kg)  BMI 20.67 kg/m2  SpO2 98%   Subjective:    Patient ID: Lori Davies, female    DOB: 26-Jul-1971, 44 y.o.   MRN: [id number]  HPI: MARENA Davies is a 44 y.o. female  Chief Complaint  Patient presents with  . URI    pt saw Dr. Wynetta Emery a few weeks ago and was give z-pack and prednisone. Pt states she still has nasal and chest congestion, headache, and sinus pressure.   See above.  Pt states she continues to be about the same with a tight chest.. Amoxil and Prednisone but Z pack doesn't.    URI  This is a new problem. Episode onset: 2 weeks. The problem has been unchanged. There has been no fever. Associated symptoms include congestion, coughing, headaches and wheezing. Associated symptoms comments: Tight chest.     Relevant past medical, surgical, family and social history reviewed and updated as indicated. Interim medical history since our last visit reviewed. Allergies and medications reviewed and updated.  Review of Systems  HENT: Positive for congestion.   Respiratory: Positive for cough and wheezing.   Neurological: Positive for headaches.    Per HPI unless specifically indicated above     Objective:    BP 108/75 mmHg  Pulse 84  Temp(Src) 98.1 F (36.7 C)  Ht 5\' 7"  (1.702 m)  Wt 132 lb (59.875 kg)  BMI 20.67 kg/m2  SpO2 98%  Wt Readings from Last 3 Encounters:  08/21/15 132 lb (59.875 kg)  08/09/15 132 lb (59.875 kg)  06/17/15 129 lb (58.514 kg)    Physical Exam  Constitutional: She is oriented to person, place, and time. She appears well-developed and well-nourished. No distress.  HENT:  Head: Normocephalic and atraumatic.  Right Ear: Tympanic membrane and ear canal normal.  Left Ear: Tympanic membrane and ear canal normal.  Nose: No rhinorrhea. Right sinus exhibits maxillary sinus tenderness. Right sinus exhibits no frontal sinus tenderness. Left sinus  exhibits maxillary sinus tenderness. Left sinus exhibits no frontal sinus tenderness.  Eyes: Conjunctivae and lids are normal. Right eye exhibits no discharge. Left eye exhibits no discharge. No scleral icterus.  Cardiovascular: Normal rate and regular rhythm.   Pulmonary/Chest: Effort normal and breath sounds normal. No respiratory distress.  Abdominal: Normal appearance. There is no splenomegaly or hepatomegaly.  Musculoskeletal: Normal range of motion.  Neurological: She is alert and oriented to person, place, and time.  Skin: Skin is intact. No rash noted. No pallor.  Psychiatric: She has a normal mood and affect. Her behavior is normal. Judgment and thought content normal.    Results for orders placed or performed during the hospital encounter of 06/12/15  Fibrin derivatives D-Dimer ([facility] only)  Result Value Ref Range   Fibrin derivatives D-dimer ([facility]) 210.39 0 - 499      Assessment & Plan:   Problem List Items Addressed This Visit    None    Visit Diagnoses    Acute maxillary sinusitis, recurrence not specified    -  Primary    Rx for Amoxil and Prednisone burst    Relevant Medications    amoxicillin (AMOXIL) 875 MG tablet    predniSONE (DELTASONE) 20 MG tablet        Follow up plan: Return if symptoms worsen or fail to improve.

## 2015-08-27 ENCOUNTER — Telehealth: Payer: Self-pay

## 2015-08-27 ENCOUNTER — Emergency Department: Payer: BLUE CROSS/BLUE SHIELD

## 2015-08-27 ENCOUNTER — Encounter: Payer: Self-pay | Admitting: Emergency Medicine

## 2015-08-27 ENCOUNTER — Emergency Department
Admission: EM | Admit: 2015-08-27 | Discharge: 2015-08-27 | Disposition: A | Discharge status: Home / Self Care | Payer: BLUE CROSS/BLUE SHIELD | Attending: Emergency Medicine | Admitting: Emergency Medicine

## 2015-08-27 DIAGNOSIS — F419 Anxiety disorder, unspecified: Secondary | ICD-10-CM | POA: Insufficient documentation

## 2015-08-27 DIAGNOSIS — R61 Generalized hyperhidrosis: Secondary | ICD-10-CM | POA: Insufficient documentation

## 2015-08-27 DIAGNOSIS — Z7952 Long term (current) use of systemic steroids: Secondary | ICD-10-CM | POA: Diagnosis not present

## 2015-08-27 DIAGNOSIS — R0602 Shortness of breath: Secondary | ICD-10-CM | POA: Insufficient documentation

## 2015-08-27 DIAGNOSIS — R42 Dizziness and giddiness: Secondary | ICD-10-CM | POA: Diagnosis not present

## 2015-08-27 DIAGNOSIS — Z87891 Personal history of nicotine dependence: Secondary | ICD-10-CM | POA: Insufficient documentation

## 2015-08-27 DIAGNOSIS — R0789 Other chest pain: Secondary | ICD-10-CM | POA: Diagnosis not present

## 2015-08-27 DIAGNOSIS — M79601 Pain in right arm: Secondary | ICD-10-CM | POA: Insufficient documentation

## 2015-08-27 DIAGNOSIS — Z79899 Other long term (current) drug therapy: Secondary | ICD-10-CM | POA: Insufficient documentation

## 2015-08-27 DIAGNOSIS — R0781 Pleurodynia: Secondary | ICD-10-CM

## 2015-08-27 DIAGNOSIS — Z792 Long term (current) use of antibiotics: Secondary | ICD-10-CM | POA: Insufficient documentation

## 2015-08-27 DIAGNOSIS — Z7951 Long term (current) use of inhaled steroids: Secondary | ICD-10-CM | POA: Diagnosis not present

## 2015-08-27 LAB — COMPREHENSIVE METABOLIC PANEL
ALBUMIN: 4 g/dL (ref 3.5–5.0)
ALT: 15 U/L (ref 14–54)
ANION GAP: 6 (ref 5–15)
AST: 13 U/L — ABNORMAL LOW (ref 15–41)
Alkaline Phosphatase: 62 U/L (ref 38–126)
BILIRUBIN TOTAL: 0.8 mg/dL (ref 0.3–1.2)
BUN: 13 mg/dL (ref 6–20)
CO2: 24 mmol/L (ref 22–32)
Calcium: 9.2 mg/dL (ref 8.9–10.3)
Chloride: 110 mmol/L (ref 101–111)
Creatinine, Ser: 0.73 mg/dL (ref 0.44–1.00)
GFR calc Af Amer: >60 mL/min (ref >60)
GFR calc non Af Amer: >60 mL/min (ref >60)
GLUCOSE: 112 mg/dL — AB (ref 65–99)
POTASSIUM: 3.8 mmol/L (ref 3.5–5.1)
Sodium: 140 mmol/L (ref 135–145)
TOTAL PROTEIN: 7.3 g/dL (ref 6.5–8.1)

## 2015-08-27 LAB — TROPONIN I

## 2015-08-27 LAB — CBC
HEMATOCRIT: 41.8 % (ref 35.0–47.0)
Hemoglobin: 14 g/dL (ref 12.0–16.0)
MCH: 28.6 pg (ref 26.0–34.0)
MCHC: 33.5 g/dL (ref 32.0–36.0)
MCV: 85.4 fL (ref 80.0–100.0)
Platelets: 260 10*3/uL (ref 150–440)
RBC: 4.89 MIL/uL (ref 3.80–5.20)
RDW: 13.4 % (ref 11.5–14.5)
WBC: 8.2 10*3/uL (ref 3.6–11.0)

## 2015-08-27 MED ORDER — KETOROLAC TROMETHAMINE 10 MG PO TABS: 10.0000 mg | ORAL_TABLET | Freq: Three times a day (TID) | ORAL | Status: DC | PRN

## 2015-08-27 MED ORDER — ACETAMINOPHEN 500 MG PO TABS
1000.0000 mg | ORAL_TABLET | Freq: Once | ORAL | Status: AC
  Administered 2015-08-27: 1000 mg via ORAL
  Filled 2015-08-27: qty 2

## 2015-08-27 MED ORDER — IOHEXOL 350 MG/ML SOLN
80.0000 mL | Freq: Once | INTRAVENOUS | Status: AC | PRN
  Administered 2015-08-27: 80 mL via INTRAVENOUS

## 2015-08-27 NOTE — Telephone Encounter (Signed)
Called patient to go to the ER and patient explained that she was already there.  Told her to call us if she needed anything.

## 2015-08-27 NOTE — Telephone Encounter (Signed)
Patient called stating that she is having a sharp pain in shoulder, dry mouth, and tightness in her chest. She was here on November 30th and was given amoxicillin and prednisone for sinus issues. Wants to know what she should do.

## 2015-08-27 NOTE — Discharge Instructions (Signed)
Please make an appointment with your primary care physician for further evaluation. You may take Toradol for pain, but do not take other NSAID medications such as Aleve, ibuprofen, Motrin, or Advil when you're taking this medication as it can cause stomach irritation.   Please return to the emergency department if you develop chest pain, shortness of breath, fainting, palpitations, fever, or any other symptoms concerning to you.

## 2015-08-27 NOTE — ED Provider Notes (Signed)
[facility] Emergency Department Provider Note  ____________________________________________  Time seen: Approximately 4:00 PM  I have reviewed the triage vital signs and the nursing notes.   HISTORY  Chief Complaint Chest Pain    HPI Lori Davies is a 44 y.o. female , otherwise healthy, presenting with chest pain. Patient states that she was watching television last night when she developed a sharp chest pain in her right chest associated with shortness of breath. When she stood up to walk around, her pain was worse with deep breaths and radiated towards the center of the chest. It self resolved. This morning she was feeling tired and was sitting doing homework with her child when she developed a central chest "tightness" with right arm pain , that was associated with diaphoresis, and shortness of breath. No nausea or vomiting, palpitations. She did have some lightheadedness without syncope. She also reports that as she began to feel anxious about the pain, her chest tightness worsened. No calf tenderness, lower extremity swelling, exogenous estrogen use, recent long travel, or tobacco abuse.  Family history is negative for blood clots or early CAD.  Social history is negative for tobacco abuse or cocaine use. History reviewed. No pertinent past medical history.  Patient Active Problem List   Diagnosis Date Noted  . Elevated IgE level 06/17/2015  . 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 Restrictive lung disease 06/17/2015  . Hyperinflation of lungs 06/17/2015  . Dyspnea 06/12/2015  . Chest pain, atypical 06/05/2015    History reviewed. No pertinent past surgical history.  Current Outpatient Rx  Name  Route  Sig  Dispense  Refill  . albuterol (PROVENTIL HFA;VENTOLIN HFA) 108 (90 BASE) MCG/ACT inhaler   Inhalation   Inhale 2 puffs into the lungs every 4 (four) hours as needed for wheezing or shortness of breath.   1 Inhaler   0   . amoxicillin (AMOXIL)  875 MG tablet   Oral   Take 1 tablet (875 mg total) by mouth 2 (two) times daily.   20 tablet   0   . fexofenadine (ALLEGRA) 180 MG tablet   Oral   Take 1 tablet (180 mg total) by mouth daily.   30 tablet   3   . fluticasone (FLOVENT HFA) 110 MCG/ACT inhaler   Inhalation   Inhale 2 puffs into the lungs 2 (two) times daily. Rinse mouth out/gargle after each use   1 Inhaler   1   . ketorolac (TORADOL) 10 MG tablet   Oral   Take 1 tablet (10 mg total) by mouth every 8 (eight) hours as needed for moderate pain (with food).   15 tablet   0   . medroxyPROGESTERone (DEPO-PROVERA) 150 MG/ML injection   Intramuscular   Inject into the muscle.         . montelukast (SINGULAIR) 10 MG tablet   Oral   Take 1 tablet (10 mg total) by mouth at bedtime.   30 tablet   3   . omeprazole (PRILOSEC) 40 MG capsule   Oral   Take 1 capsule (40 mg total) by mouth daily.   30 capsule   3   . predniSONE (DELTASONE) 20 MG tablet   Oral   Take 1 tablet (20 mg total) by mouth daily with breakfast.   10 tablet   0     Allergies Doxycycline and Fish oil  Family History  Problem Relation Age of Onset  . Heart disease Mother   .  Lung disease Mother   . Heart disease Father   . Hypertension Father   . Autism Son     Social History Social History  Substance Use Topics  . Smoking status: Former Smoker    Types: Cigarettes    Quit date: 06/04/2014  . Smokeless tobacco: Never Used  . Alcohol Use: No    Review of Systems Constitutional: No fever/chills. Positive lightheadedness. Negative syncope. Eyes: No visual changes. ENT: No sore throat. Cardiovascular: Positive chest pain, negative palpitations. Respiratory: Positive shortness of breath.  No cough. Gastrointestinal: No abdominal pain.  No nausea, no vomiting.  No diarrhea.  No constipation. Genitourinary: Negative for dysuria. Musculoskeletal: Negative for back pain. Right upper extremity pain. Skin: Negative for  rash. Neurological: Negative for headaches, focal weakness or numbness. Psychiatric:Reports anxious feeling  10-point ROS otherwise negative.  ____________________________________________   PHYSICAL EXAM:  VITAL SIGNS: ED Triage Vitals  Enc Vitals Group     BP 08/27/15 1433 103/72 mmHg     Pulse Rate 08/27/15 1433 74     Resp 08/27/15 1433 18     Temp 08/27/15 1433 97.9 F (36.6 C)     Temp Source 08/27/15 1433 Oral     SpO2 08/27/15 1433 97 %     Weight 08/27/15 1433 132 lb (59.875 kg)     Height 08/27/15 1433 5\' 6"  (1.676 m)     Head Cir --      Peak Flow --      Pain Score 08/27/15 1428 4     Pain Loc --      Pain Edu? --      Excl. in [place]? --     Constitutional: Alert and oriented. Well appearing and in no acute distress. Answer question appropriately. Eyes: Conjunctivae are normal.  EOMI. Head: Atraumatic. Nose: No congestion/rhinnorhea. Mouth/Throat: Mucous membranes are moist.  Neck: No stridor.  Supple.  No JVD. Cardiovascular: Normal rate, regular rhythm. Holosystolic murmur without rubs or gallops.  Respiratory: Normal respiratory effort.  No retractions. Lungs CTAB.  No wheezes, rales or ronchi. Gastrointestinal: Soft and nontender. No distention. No peritoneal signs. Musculoskeletal: No LE edema. No calf tenderness or palpable cords, negative Homans sign. Neurologic:  Normal speech and language. No gross focal neurologic deficits are appreciated.  Skin:  Skin is warm, dry and intact. No rash noted. Psychiatric: Mood is normal; mildly anxious affect.Marland Kitchen Speech and behavior are normal.  Normal judgement.  ____________________________________________   LABS (all labs ordered are listed, but only abnormal results are displayed)  Labs Reviewed  COMPREHENSIVE METABOLIC PANEL - Abnormal; Notable for the following:    Glucose, Bld 112 (*)    AST 13 (*)    All other components within normal limits  CBC  TROPONIN I    ____________________________________________  EKG  ED ECG REPORT I, Eula Listen, the attending physician, personally viewed and interpreted this ECG.   Date: 08/27/2015  EKG Time: 1429  Rate: 74  Rhythm: normal sinus rhythm  Axis: Normal  Intervals:none  ST&T Change: Nonspecific T-wave inversion in V1. No ST elevation. No ischemic changes.  ____________________________________________  RADIOLOGY  Ct Angio Chest Pe W/cm &/or Wo Cm  08/27/2015  CLINICAL DATA:  Right-sided chest pain beginning yesterday. Pain radiates down right arm with diaphoresis. EXAM: CT ANGIOGRAPHY CHEST WITH CONTRAST TECHNIQUE: Multidetector CT imaging of the chest was performed using the standard protocol during bolus administration of intravenous contrast. Multiplanar CT image reconstructions and MIPs were obtained to evaluate the vascular anatomy. CONTRAST:  34mL OMNIPAQUE IOHEXOL 350 MG/ML SOLN COMPARISON:  Chest x-ray 06/12/2015 FINDINGS: No filling defects in the pulmonary arteries to suggest pulmonary emboli. Heart is normal size. Aorta is normal caliber. No mediastinal, hilar, or axillary adenopathy. Chest wall soft tissues are unremarkable. Mild hyperinflation. Biapical scarring. Otherwise no confluent airspace opacities. Linear densities in the lung bases compatible with scarring. No effusions. Imaging into the upper abdomen shows no acute findings. Review of the MIP images confirms the above findings. IMPRESSION: No evidence of pulmonary embolus. No acute findings.  Mild hyperinflation. Electronically Signed   By: Rolm Baptise M.D.   On: 08/27/2015 16:40    ____________________________________________   PROCEDURES  Procedure(s) performed: None  Critical Care performed: No ____________________________________________   INITIAL IMPRESSION / ASSESSMENT AND PLAN / ED COURSE  Pertinent labs & imaging results that were available during my care of the patient were reviewed by me and  considered in my medical decision making (see chart for details).  44 y.o. female without any cardiac risk factors presenting with right-sided and then central chest tightness. She has stable vital signs and a reassuring EKG without ischemic changes. Her troponin is also negative. She does have a pleuritic component to her chest pain so I'll get a CT angiogram to rule out PE. If her workup in the emergency department is negative, she will follow up with her primary care physician for further evaluation.  ----------------------------------------- 4:52 PM on 08/27/2015 -----------------------------------------  The patient remains hemodynamically stable and has a negative workup in the emergency department. Her CT angiogram does not show any evidence of PE or other significant abnormalities. She has EKG without ischemic changes and a negative troponin. After discussing the results with the patient, she is not telling me that she has been having ongoing chest pain for several weeks or months and has appointment with a pulmonologist tomorrow. I have recommended that she have her medical records released from [facility] to the pulmonologist and her primary care physician. She understands return precautions as well as follow-up instructions.  ____________________________________________  FINAL CLINICAL IMPRESSION(S) / ED DIAGNOSES  Final diagnoses:  Pleuritic chest pain  Shortness of breath  Diaphoresis      NEW MEDICATIONS STARTED DURING THIS VISIT:  New Prescriptions   KETOROLAC (TORADOL) 10 MG TABLET    Take 1 tablet (10 mg total) by mouth every 8 (eight) hours as needed for moderate pain (with food).     Eula Listen, MD 08/27/15 [phone]

## 2015-08-27 NOTE — ED Notes (Addendum)
Pt states she started having pain to right side of chest yesterday, states she placed a heating pad on her chest and felt better, states today she was sitting at the computer and started having chest tightness with diaphoresis and radiating down right arm with right arm numbness, states pain has improved but continues to have right arm numbness, states she has been having this tightness and has an appointment with the pulmonologist tomorrow

## 2015-08-27 NOTE — Telephone Encounter (Signed)
Needs to go to the ER.

## 2015-09-12 ENCOUNTER — Ambulatory Visit: Payer: BLUE CROSS/BLUE SHIELD | Admitting: Family Medicine

## 2015-09-20 ENCOUNTER — Ambulatory Visit: Payer: BLUE CROSS/BLUE SHIELD | Admitting: Family Medicine

## 2015-10-04 ENCOUNTER — Encounter: Payer: Self-pay | Admitting: Family Medicine

## 2015-10-04 ENCOUNTER — Ambulatory Visit (INDEPENDENT_AMBULATORY_CARE_PROVIDER_SITE_OTHER): Payer: BLUE CROSS/BLUE SHIELD | Admitting: Family Medicine

## 2015-10-04 VITALS — BP 105/71 | HR 68 | Temp 98.2°F | Ht 66.0 in | Wt 135.0 lb

## 2015-10-04 DIAGNOSIS — R5382 Chronic fatigue, unspecified: Secondary | ICD-10-CM

## 2015-10-04 DIAGNOSIS — J069 Acute upper respiratory infection, unspecified: Secondary | ICD-10-CM | POA: Diagnosis not present

## 2015-10-04 LAB — CBC WITH DIFFERENTIAL/PLATELET
HEMATOCRIT: 42.7 % (ref 34.0–46.6)
HEMOGLOBIN: 14.8 g/dL (ref 11.1–15.9)
LYMPHS: 28 %
Lymphocytes Absolute: 2.5 10*3/uL (ref 0.7–3.1)
MCH: 30.1 pg (ref 26.6–33.0)
MCHC: 34.7 g/dL (ref 31.5–35.7)
MCV: 87 fL (ref 79–97)
MID (ABSOLUTE): 0.9 10*3/uL (ref 0.1–1.6)
MID: 10 %
Neutrophils Absolute: 5.4 10*3/uL (ref 1.4–7.0)
Neutrophils: 62 %
Platelets: 332 10*3/uL (ref 150–379)
RBC: 4.92 x10E6/uL (ref 3.77–5.28)
RDW: 13.5 % (ref 12.3–15.4)
WBC: 8.8 10*3/uL (ref 3.4–10.8)

## 2015-10-04 MED ORDER — BENZONATATE 200 MG PO CAPS: 200.0000 mg | ORAL_CAPSULE | Freq: Three times a day (TID) | ORAL | Status: DC | PRN

## 2015-10-04 MED ORDER — MONTELUKAST SODIUM 10 MG PO TABS: 10.0000 mg | ORAL_TABLET | Freq: Every day | ORAL | Status: DC

## 2015-10-04 MED ORDER — PREDNISONE 10 MG PO TABS: 10.0000 mg | ORAL_TABLET | Freq: Three times a day (TID) | ORAL | Status: DC

## 2015-10-04 NOTE — Progress Notes (Signed)
BP 105/71 mmHg  Pulse 68  Temp(Src) 98.2 F (36.8 C)  Ht 5\' 6"  (1.676 m)  Wt 135 lb (61.236 kg)  BMI 21.80 kg/m2  SpO2 99%   Subjective:    Patient ID: Lori Davies, female    DOB: 03-11-1971, 45 y.o.   MRN: [id number]  HPI: Lori Davies is a 45 y.o. female  Chief Complaint  Patient presents with  . Fatigue    Patient states that she is very fatigued she would like her thyroid and iron check  . URI  . Medication Refill    Patient needs a refill on her Singulair   UPPER RESPIRATORY TRACT INFECTION Duration: 3-4 days Worst symptom: fatigue Fever: yes, chills and sweats Cough: yes Shortness of breath: yes Wheezing: yes Chest pain: yes Chest tightness: yes Chest congestion: yes Nasal congestion: yes Runny nose: no Post nasal drip: yes Sneezing: no Sore throat: yes Swollen glands: no Sinus pressure: yes Headache: yes Face pain: yes Toothache: no Ear pain: yes left Ear pressure: no  Eyes red/itching:no Eye drainage/crusting: yes  Vomiting: no Rash: no Fatigue: yes Sick contacts: no Strep contacts: no  Context: stable Recurrent sinusitis: no Relief with OTC cold/cough medications: no  Treatments attempted: mucinex   FATIGUE Duration:  3-4 days Severity: severe  Onset: sudden Context when symptoms started:  cold symptoms Symptoms improve with rest: no  Depressive symptoms: no Stress/anxiety: yes- her dog just passed away, was on zoloft in the past, wonders if she should be on it again Insomnia: no  Snoring: no Observed apnea by bed partner: no Daytime hypersomnolence:no Wakes feeling refreshed: no History of sleep study: no Dysnea on exertion:  yes Orthopnea/PND: no Chest pain: yes- with the cold Chronic cough: no Lower extremity edema: no Arthralgias:no Myalgias: no Weakness: no Rash: no  Relevant past medical, surgical, family and social history reviewed and updated as indicated. Interim medical history since our last visit  reviewed. Allergies and medications reviewed and updated.  Review of Systems  Constitutional: Negative.   HENT: Positive for congestion, ear pain, postnasal drip, rhinorrhea, sinus pressure, sneezing and sore throat. Negative for dental problem, drooling, ear discharge, facial swelling, hearing loss, mouth sores, nosebleeds, tinnitus, trouble swallowing and voice change.   Respiratory: Negative.   Cardiovascular: Negative.   Psychiatric/Behavioral: Negative.     Per HPI unless specifically indicated above     Objective:    BP 105/71 mmHg  Pulse 68  Temp(Src) 98.2 F (36.8 C)  Ht 5\' 6"  (1.676 m)  Wt 135 lb (61.236 kg)  BMI 21.80 kg/m2  SpO2 99%  Wt Readings from Last 3 Encounters:  10/04/15 135 lb (61.236 kg)  08/27/15 132 lb (59.875 kg)  08/21/15 132 lb (59.875 kg)    Physical Exam  Constitutional: She is oriented to person, place, and time. She appears well-developed and well-nourished. No distress.  HENT:  Head: Normocephalic and atraumatic.  Right Ear: Hearing normal.  Left Ear: Hearing normal.  Nose: Nose normal.  Eyes: Conjunctivae and lids are normal. Right eye exhibits no discharge. Left eye exhibits no discharge. No scleral icterus.  Cardiovascular: Normal rate, regular rhythm, normal heart sounds and intact distal pulses.  Exam reveals no gallop and no friction rub.   No murmur heard. Pulmonary/Chest: Effort normal. No respiratory distress. She has decreased breath sounds in the right upper field, the right middle field, the right lower field, the left upper field, the left middle field and the left lower field. She has no  wheezes. She has no rhonchi. She has no rales. She exhibits no tenderness.  Musculoskeletal: Normal range of motion.  Neurological: She is alert and oriented to person, place, and time.  Skin: Skin is warm, dry and intact. No rash noted. No erythema. No pallor.  Psychiatric: She has a normal mood and affect. Her speech is normal and behavior is  normal. Judgment and thought content normal. Cognition and memory are normal.  Nursing note and vitals reviewed.   Results for orders placed or performed during the hospital encounter of 08/27/15  CBC  Result Value Ref Range   WBC 8.2 3.6 - 11.0 K/uL   RBC 4.89 3.80 - 5.20 MIL/uL   Hemoglobin 14.0 12.0 - 16.0 g/dL   HCT 41.8 35.0 - 47.0 %   MCV 85.4 80.0 - 100.0 fL   MCH 28.6 26.0 - 34.0 pg   MCHC 33.5 32.0 - 36.0 g/dL   RDW 13.4 11.5 - 14.5 %   Platelets 260 150 - 440 K/uL  Comprehensive metabolic panel  Result Value Ref Range   Sodium 140 135 - 145 mmol/L   Potassium 3.8 3.5 - 5.1 mmol/L   Chloride 110 101 - 111 mmol/L   CO2 24 22 - 32 mmol/L   Glucose, Bld 112 (H) 65 - 99 mg/dL   BUN 13 6 - 20 mg/dL   Creatinine, Ser 0.73 0.44 - 1.00 mg/dL   Calcium 9.2 8.9 - 10.3 mg/dL   Total Protein 7.3 6.5 - 8.1 g/dL   Albumin 4.0 3.5 - 5.0 g/dL   AST 13 (L) 15 - 41 U/L   ALT 15 14 - 54 U/L   Alkaline Phosphatase 62 38 - 126 U/L   Total Bilirubin 0.8 0.3 - 1.2 mg/dL   GFR calc non Af Amer >60 >60 mL/min   GFR calc Af Amer >60 >60 mL/min   Anion gap 6 5 - 15  Troponin I  Result Value Ref Range   Troponin I <0.03 <0.031 ng/mL      Assessment & Plan:   Problem List Items Addressed This Visit    None    Visit Diagnoses    Upper respiratory infection    -  Primary    Will restart singulair. Prednisone for 5 days. Tessalon perles for comfort. Call if not getting better or getting worse. Lung recheck in 2 weeks.     Chronic fatigue        Possibly due to anxiety or URI. Checking labs and await results. If not better in 2 weeks, consider restarting zoloft.     Relevant Orders    CBC With Differential/Platelet    Thyroid Panel With TSH    Comprehensive metabolic panel        Follow up plan: Return in about 2 weeks (around 10/18/2015) for Lung recheck.

## 2015-10-05 LAB — COMPREHENSIVE METABOLIC PANEL
A/G RATIO: 2 (ref 1.1–2.5)
ALBUMIN: 4.3 g/dL (ref 3.5–5.5)
ALT: 8 IU/L (ref 0–32)
AST: 14 IU/L (ref 0–40)
Alkaline Phosphatase: 76 IU/L (ref 39–117)
BUN/Creatinine Ratio: 15 (ref 9–23)
BUN: 11 mg/dL (ref 6–24)
CHLORIDE: 103 mmol/L (ref 96–106)
CO2: 22 mmol/L (ref 18–29)
Calcium: 9.2 mg/dL (ref 8.7–10.2)
Creatinine, Ser: 0.71 mg/dL (ref 0.57–1.00)
GFR calc non Af Amer: 103 mL/min/{1.73_m2} (ref >59)
GFR, EST AFRICAN AMERICAN: 119 mL/min/{1.73_m2} (ref >59)
Globulin, Total: 2.2 g/dL (ref 1.5–4.5)
Glucose: 77 mg/dL (ref 65–99)
POTASSIUM: 4.5 mmol/L (ref 3.5–5.2)
SODIUM: 142 mmol/L (ref 134–144)
TOTAL PROTEIN: 6.5 g/dL (ref 6.0–8.5)

## 2015-10-05 LAB — THYROID PANEL WITH TSH
Free Thyroxine Index: 2.3 (ref 1.2–4.9)
T3 Uptake Ratio: 29 % (ref 24–39)
T4, Total: 8.1 ug/dL (ref 4.5–12.0)
TSH: 2.09 u[IU]/mL (ref 0.450–4.500)

## 2015-10-07 ENCOUNTER — Encounter: Payer: Self-pay | Admitting: Family Medicine

## 2015-10-18 ENCOUNTER — Ambulatory Visit: Payer: BLUE CROSS/BLUE SHIELD | Admitting: Family Medicine

## 2015-10-18 ENCOUNTER — Encounter: Payer: Self-pay | Admitting: Family Medicine

## 2015-10-30 ENCOUNTER — Ambulatory Visit (INDEPENDENT_AMBULATORY_CARE_PROVIDER_SITE_OTHER): Payer: BLUE CROSS/BLUE SHIELD | Admitting: Family Medicine

## 2015-10-30 ENCOUNTER — Encounter: Payer: Self-pay | Admitting: Family Medicine

## 2015-10-30 VITALS — BP 124/63 | HR 76 | Temp 98.8°F | Ht 67.0 in | Wt 140.6 lb

## 2015-10-30 DIAGNOSIS — F419 Anxiety disorder, unspecified: Secondary | ICD-10-CM

## 2015-10-30 DIAGNOSIS — K219 Gastro-esophageal reflux disease without esophagitis: Secondary | ICD-10-CM | POA: Insufficient documentation

## 2015-10-30 DIAGNOSIS — J069 Acute upper respiratory infection, unspecified: Secondary | ICD-10-CM | POA: Diagnosis not present

## 2015-10-30 MED ORDER — SERTRALINE HCL 50 MG PO TABS: ORAL_TABLET | ORAL | Status: DC

## 2015-10-30 MED ORDER — DEXLANSOPRAZOLE 60 MG PO CPDR: 60.0000 mg | DELAYED_RELEASE_CAPSULE | Freq: Every day | ORAL | Status: DC

## 2015-10-30 NOTE — Assessment & Plan Note (Signed)
Exacerbated. Not better on her omeprazole. Has failed omeprazole. Will start dexilant and check back in 1 month to see how she's doing.

## 2015-10-30 NOTE — Assessment & Plan Note (Signed)
x3 weeks, worse with death of her dog. Would like to go back on zoloft. Risks and benefits discussed. Will restart today. Recheck 1 month.

## 2015-10-30 NOTE — Progress Notes (Signed)
BP 124/63 mmHg  Pulse 76  Temp(Src) 98.8 F (37.1 C)  Ht 5\' 7"  (1.702 m)  Wt 140 lb 9.6 oz (63.776 kg)  BMI 22.02 kg/m2  SpO2 98%   Subjective:    Patient ID: Lori Davies, female    DOB: June 06, 1971, 45 y.o.   MRN: [id number]  HPI: Lori Davies is a 45 y.o. female  Chief Complaint  Patient presents with  . Anxiety    Still about the same   Lungs are feeling better, still a little tight, thinks that that is due to heartburn. Much better after medication.   ANXIETY/STRESS Duration: 3 weeks Anxious mood: yes  Excessive worrying: yes Irritability: no  Sweating: no Nausea: no Palpitations: yes Hyperventilation: no Panic attacks: no Agoraphobia: no  Obscessions/compulsions: no Depressed mood: no Depression screen PHQ 2/9 10/30/2015  Decreased Interest 0  Down, Depressed, Hopeless 0  PHQ - 2 Score 0  Altered sleeping 1  Tired, decreased energy 1  Change in appetite 0  Feeling bad or failure about yourself  0  Trouble concentrating 0  Moving slowly or fidgety/restless 0  Suicidal thoughts 0  PHQ-9 Score 2  Difficult doing work/chores Not difficult at all    GAD 7 : Generalized Anxiety Score 10/30/2015  Nervous, Anxious, on Edge 1  Control/stop worrying 0  Worry too much - different things 1  Trouble relaxing 1  Restless 0  Easily annoyed or irritable 1  Afraid - awful might happen 0  Total GAD 7 Score 4  Anxiety Difficulty Somewhat difficult  Anhedonia: no Weight changes: no Insomnia: yes hard to stay asleep  Hypersomnia: no Fatigue/loss of energy: yes Feelings of worthlessness: no Feelings of guilt: no Impaired concentration/indecisiveness: no Suicidal ideations: no  Crying spells: no Recent Stressors/Life Changes: yes   Relationship problems: no   Family stress: yes     Financial stress: no    Job stress: no    Recent death/loss: yes- her dog  HEARTBURN Duration:  chronic Onset: gradual Nature: burning Location: sternal and  epigastric Radiation: into the chest Episode duration: hours Heartburn frequency: daily with every meal Alleviatiating factors: nothing Aggravating factors: eating Status: exacerbated  Treatments attempted: omeprazole- has been on 40mg  and no better Antacid use frequency: daily Dysphagia: no Odynophagia:  no Nausea: no Vomiting: no Hematemesis: no Blood in stool: no Constitutional: no   Relevant past medical, surgical, family and social history reviewed and updated as indicated. Interim medical history since our last visit reviewed. Allergies and medications reviewed and updated.  Review of Systems  Constitutional: Negative.   HENT: Negative.   Respiratory: Negative.   Cardiovascular: Negative.   Psychiatric/Behavioral: Negative.     Per HPI unless specifically indicated above     Objective:    BP 124/63 mmHg  Pulse 76  Temp(Src) 98.8 F (37.1 C)  Ht 5\' 7"  (1.702 m)  Wt 140 lb 9.6 oz (63.776 kg)  BMI 22.02 kg/m2  SpO2 98%  Wt Readings from Last 3 Encounters:  10/30/15 140 lb 9.6 oz (63.776 kg)  10/04/15 135 lb (61.236 kg)  08/27/15 132 lb (59.875 kg)    Physical Exam  Constitutional: She is oriented to person, place, and time. She appears well-developed and well-nourished. No distress.  HENT:  Head: Normocephalic and atraumatic.  Right Ear: Hearing normal.  Left Ear: Hearing normal.  Nose: Nose normal.  Eyes: Conjunctivae and lids are normal. Right eye exhibits no discharge. Left eye exhibits no discharge. No scleral icterus.  Cardiovascular: Normal rate, regular rhythm, normal heart sounds and intact distal pulses.  Exam reveals no gallop and no friction rub.   No murmur heard. Pulmonary/Chest: Effort normal and breath sounds normal. No respiratory distress. She has no wheezes. She has no rales. She exhibits no tenderness.  Abdominal: Soft. Bowel sounds are normal. She exhibits no distension and no mass. There is no tenderness. There is no rebound and no  guarding.  Musculoskeletal: Normal range of motion.  Neurological: She is alert and oriented to person, place, and time.  Skin: Skin is warm, dry and intact. No rash noted. No erythema. No pallor.  Psychiatric: She has a normal mood and affect. Her speech is normal and behavior is normal. Judgment and thought content normal. Cognition and memory are normal.  Nursing note and vitals reviewed.   Results for orders placed or performed in visit on 10/04/15  CBC With Differential/Platelet  Result Value Ref Range   WBC 8.8 3.4 - 10.8 x10E3/uL   RBC 4.92 3.77 - 5.28 x10E6/uL   Hemoglobin 14.8 11.1 - 15.9 g/dL   Hematocrit 42.7 34.0 - 46.6 %   MCV 87 79 - 97 fL   MCH 30.1 26.6 - 33.0 pg   MCHC 34.7 31.5 - 35.7 g/dL   RDW 13.5 12.3 - 15.4 %   Platelets 332 150 - 379 x10E3/uL   Neutrophils 62 %   Lymphs 28 %   MID 10 %   Neutrophils Absolute 5.4 1.4 - 7.0 x10E3/uL   Lymphocytes Absolute 2.5 0.7 - 3.1 x10E3/uL   MID (Absolute) 0.9 0.1 - 1.6 X10E3/uL  Thyroid Panel With TSH  Result Value Ref Range   TSH 2.090 0.450 - 4.500 uIU/mL   T4, Total 8.1 4.5 - 12.0 ug/dL   T3 Uptake Ratio 29 24 - 39 %   Free Thyroxine Index 2.3 1.2 - 4.9  Comprehensive metabolic panel  Result Value Ref Range   Glucose 77 65 - 99 mg/dL   BUN 11 6 - 24 mg/dL   Creatinine, Ser 0.71 0.57 - 1.00 mg/dL   GFR calc non Af Amer 103 >59 mL/min/1.73   GFR calc Af Amer 119 >59 mL/min/1.73   BUN/Creatinine Ratio 15 9 - 23   Sodium 142 134 - 144 mmol/L   Potassium 4.5 3.5 - 5.2 mmol/L   Chloride 103 96 - 106 mmol/L   CO2 22 18 - 29 mmol/L   Calcium 9.2 8.7 - 10.2 mg/dL   Total Protein 6.5 6.0 - 8.5 g/dL   Albumin 4.3 3.5 - 5.5 g/dL   Globulin, Total 2.2 1.5 - 4.5 g/dL   Albumin/Globulin Ratio 2.0 1.1 - 2.5   Bilirubin Total <0.2 0.0 - 1.2 mg/dL   Alkaline Phosphatase 76 39 - 117 IU/L   AST 14 0 - 40 IU/L   ALT 8 0 - 32 IU/L      Assessment & Plan:   Problem List Items Addressed This Visit      Digestive    GERD (gastroesophageal reflux disease)    Exacerbated. Not better on her omeprazole. Has failed omeprazole. Will start dexilant and check back in 1 month to see how she's doing.       Relevant Medications   dexlansoprazole (DEXILANT) 60 MG capsule     Other   Acute anxiety - Primary    x3 weeks, worse with death of her dog. Would like to go back on zoloft. Risks and benefits discussed. Will restart today. Recheck 1 month.  Relevant Medications   sertraline (ZOLOFT) 50 MG tablet    Other Visit Diagnoses    Upper respiratory infection        Resolved. Lungs clear. Continue to monitor.         Follow up plan: Return in about 4 weeks (around 11/27/2015) for GERD/Anxiety recheck.

## 2015-11-06 ENCOUNTER — Ambulatory Visit: Payer: BLUE CROSS/BLUE SHIELD | Admitting: Family Medicine

## 2015-11-28 ENCOUNTER — Encounter: Payer: Self-pay | Admitting: Family Medicine

## 2015-11-28 ENCOUNTER — Ambulatory Visit: Payer: BLUE CROSS/BLUE SHIELD | Admitting: Family Medicine

## 2016-01-02 ENCOUNTER — Other Ambulatory Visit: Payer: Self-pay | Admitting: Family Medicine

## 2016-05-04 ENCOUNTER — Ambulatory Visit (INDEPENDENT_AMBULATORY_CARE_PROVIDER_SITE_OTHER): Payer: BLUE CROSS/BLUE SHIELD | Admitting: Family Medicine

## 2016-05-04 ENCOUNTER — Encounter: Payer: Self-pay | Admitting: Family Medicine

## 2016-05-04 VITALS — BP 100/69 | HR 75 | Temp 98.0°F | Ht 66.3 in | Wt 143.0 lb

## 2016-05-04 DIAGNOSIS — J0100 Acute maxillary sinusitis, unspecified: Secondary | ICD-10-CM

## 2016-05-04 MED ORDER — PREDNISONE 50 MG PO TABS: ORAL_TABLET | ORAL | 0 refills | Status: DC

## 2016-05-04 MED ORDER — AMOXICILLIN 875 MG PO TABS: 875.0000 mg | ORAL_TABLET | Freq: Two times a day (BID) | ORAL | 0 refills | Status: DC

## 2016-05-04 NOTE — Progress Notes (Signed)
BP 100/69 (BP Location: Left Arm, Patient Position: Sitting, Cuff Size: Normal)   Pulse 75   Temp 98 F (36.7 C)   Ht 5' 6.3" (1.684 m)   Wt 143 lb (64.9 kg)   SpO2 96%   BMI 22.87 kg/m    Subjective:    Patient ID: Lori Davies, female    DOB: Apr 04, 1971, 45 y.o.   MRN: [id number]  HPI: Lori Davies is a 45 y.o. female  Chief Complaint  Patient presents with  . Headache    UPPER RESPIRATORY TRACT INFECTION Duration: 9-10 days Worst symptom: Had a headache for about 9 days in a row, tylenol didn't help.  Fever: yes, subjective Cough: yes Shortness of breath: yes Wheezing: yes Chest pain: yes, with cough Chest tightness: yes Chest congestion: yes Nasal congestion: yes Runny nose: no Post nasal drip: yes Sneezing: yes Sore throat: yes Swollen glands: no Sinus pressure: yes Headache: yes Face pain: yes Toothache: yes Ear pain: no  Ear pressure: no  Eyes red/itching:yes Eye drainage/crusting: no  Vomiting: no Rash: no Fatigue: yes Sick contacts: no Strep contacts: no  Context: stable Recurrent sinusitis: no Relief with OTC cold/cough medications: no  Treatments attempted: cold/sinus, mucinex, anti-histamine and pseudoephedrine  Relevant past medical, surgical, family and social history reviewed and updated as indicated. Interim medical history since our last visit reviewed. Allergies and medications reviewed and updated.  Review of Systems  Constitutional: Negative.   HENT: Positive for congestion, postnasal drip, rhinorrhea, sinus pressure, sneezing and sore throat. Negative for dental problem, drooling, ear discharge, ear pain, facial swelling, hearing loss, mouth sores, nosebleeds, tinnitus, trouble swallowing and voice change.   Respiratory: Negative.   Cardiovascular: Negative.   Psychiatric/Behavioral: Negative.     Per HPI unless specifically indicated above     Objective:    BP 100/69 (BP Location: Left Arm, Patient Position: Sitting,  Cuff Size: Normal)   Pulse 75   Temp 98 F (36.7 C)   Ht 5' 6.3" (1.684 m)   Wt 143 lb (64.9 kg)   SpO2 96%   BMI 22.87 kg/m   Wt Readings from Last 3 Encounters:  05/04/16 143 lb (64.9 kg)  10/30/15 140 lb 9.6 oz (63.8 kg)  10/04/15 135 lb (61.2 kg)    Physical Exam  Constitutional: She is oriented to person, place, and time. She appears well-developed and well-nourished. No distress.  HENT:  Head: Normocephalic and atraumatic.  Right Ear: Hearing, external ear and ear canal normal. A middle ear effusion is present.  Left Ear: Hearing, external ear and ear canal normal. A middle ear effusion is present.  Nose: Mucosal edema and rhinorrhea present. Left sinus exhibits maxillary sinus tenderness.  Mouth/Throat: Uvula is midline and mucous membranes are normal. Oropharyngeal exudate present.  Eyes: Conjunctivae and lids are normal. Pupils are equal, round, and reactive to light. Right eye exhibits no discharge. Left eye exhibits no discharge. No scleral icterus.  Neck: Normal range of motion. Neck supple. No JVD present. No tracheal deviation present. No thyromegaly present.  Cardiovascular: Normal rate, regular rhythm, normal heart sounds and intact distal pulses.  Exam reveals no gallop and no friction rub.   No murmur heard. Pulmonary/Chest: Effort normal and breath sounds normal. No stridor. No respiratory distress. She has no wheezes. She has no rales. She exhibits no tenderness.  Musculoskeletal: Normal range of motion.  Lymphadenopathy:    She has cervical adenopathy.  Neurological: She is alert and oriented to person, place, and  time.  Skin: Skin is warm, dry and intact. No rash noted. She is not diaphoretic. No erythema. No pallor.  Psychiatric: She has a normal mood and affect. Her speech is normal and behavior is normal. Judgment and thought content normal. Cognition and memory are normal.    Results for orders placed or performed in visit on 10/04/15  CBC With  Differential/Platelet  Result Value Ref Range   WBC 8.8 3.4 - 10.8 x10E3/uL   RBC 4.92 3.77 - 5.28 x10E6/uL   Hemoglobin 14.8 11.1 - 15.9 g/dL   Hematocrit 42.7 34.0 - 46.6 %   MCV 87 79 - 97 fL   MCH 30.1 26.6 - 33.0 pg   MCHC 34.7 31.5 - 35.7 g/dL   RDW 13.5 12.3 - 15.4 %   Platelets 332 150 - 379 x10E3/uL   Neutrophils 62 %   Lymphs 28 %   MID 10 %   Neutrophils Absolute 5.4 1.4 - 7.0 x10E3/uL   Lymphocytes Absolute 2.5 0.7 - 3.1 x10E3/uL   MID (Absolute) 0.9 0.1 - 1.6 X10E3/uL  Thyroid Panel With TSH  Result Value Ref Range   TSH 2.090 0.450 - 4.500 uIU/mL   T4, Total 8.1 4.5 - 12.0 ug/dL   T3 Uptake Ratio 29 24 - 39 %   Free Thyroxine Index 2.3 1.2 - 4.9  Comprehensive metabolic panel  Result Value Ref Range   Glucose 77 65 - 99 mg/dL   BUN 11 6 - 24 mg/dL   Creatinine, Ser 0.71 0.57 - 1.00 mg/dL   GFR calc non Af Amer 103 >59 mL/min/1.73   GFR calc Af Amer 119 >59 mL/min/1.73   BUN/Creatinine Ratio 15 9 - 23   Sodium 142 134 - 144 mmol/L   Potassium 4.5 3.5 - 5.2 mmol/L   Chloride 103 96 - 106 mmol/L   CO2 22 18 - 29 mmol/L   Calcium 9.2 8.7 - 10.2 mg/dL   Total Protein 6.5 6.0 - 8.5 g/dL   Albumin 4.3 3.5 - 5.5 g/dL   Globulin, Total 2.2 1.5 - 4.5 g/dL   Albumin/Globulin Ratio 2.0 1.1 - 2.5   Bilirubin Total <0.2 0.0 - 1.2 mg/dL   Alkaline Phosphatase 76 39 - 117 IU/L   AST 14 0 - 40 IU/L   ALT 8 0 - 32 IU/L      Assessment & Plan:   Problem List Items Addressed This Visit    None    Visit Diagnoses    Acute maxillary sinusitis, recurrence not specified    -  Primary   Will start amoxicillin. Prednisone for congestion. Call if not getting better or getting worse. Continue to monitor.    Relevant Medications   amoxicillin (AMOXIL) 875 MG tablet   predniSONE (DELTASONE) 50 MG tablet       Follow up plan: Return if symptoms worsen or fail to improve.

## 2016-05-25 IMAGING — CR DG CHEST 2V
2 series · 2 of 2 positions shown · non-contrast
Comparison: 08/06/2010

CLINICAL DATA: Left-sided chest pain and shortness of Breath for 6
days, initial encounter

EXAM:
CHEST - 2 VIEW

[chest pa]
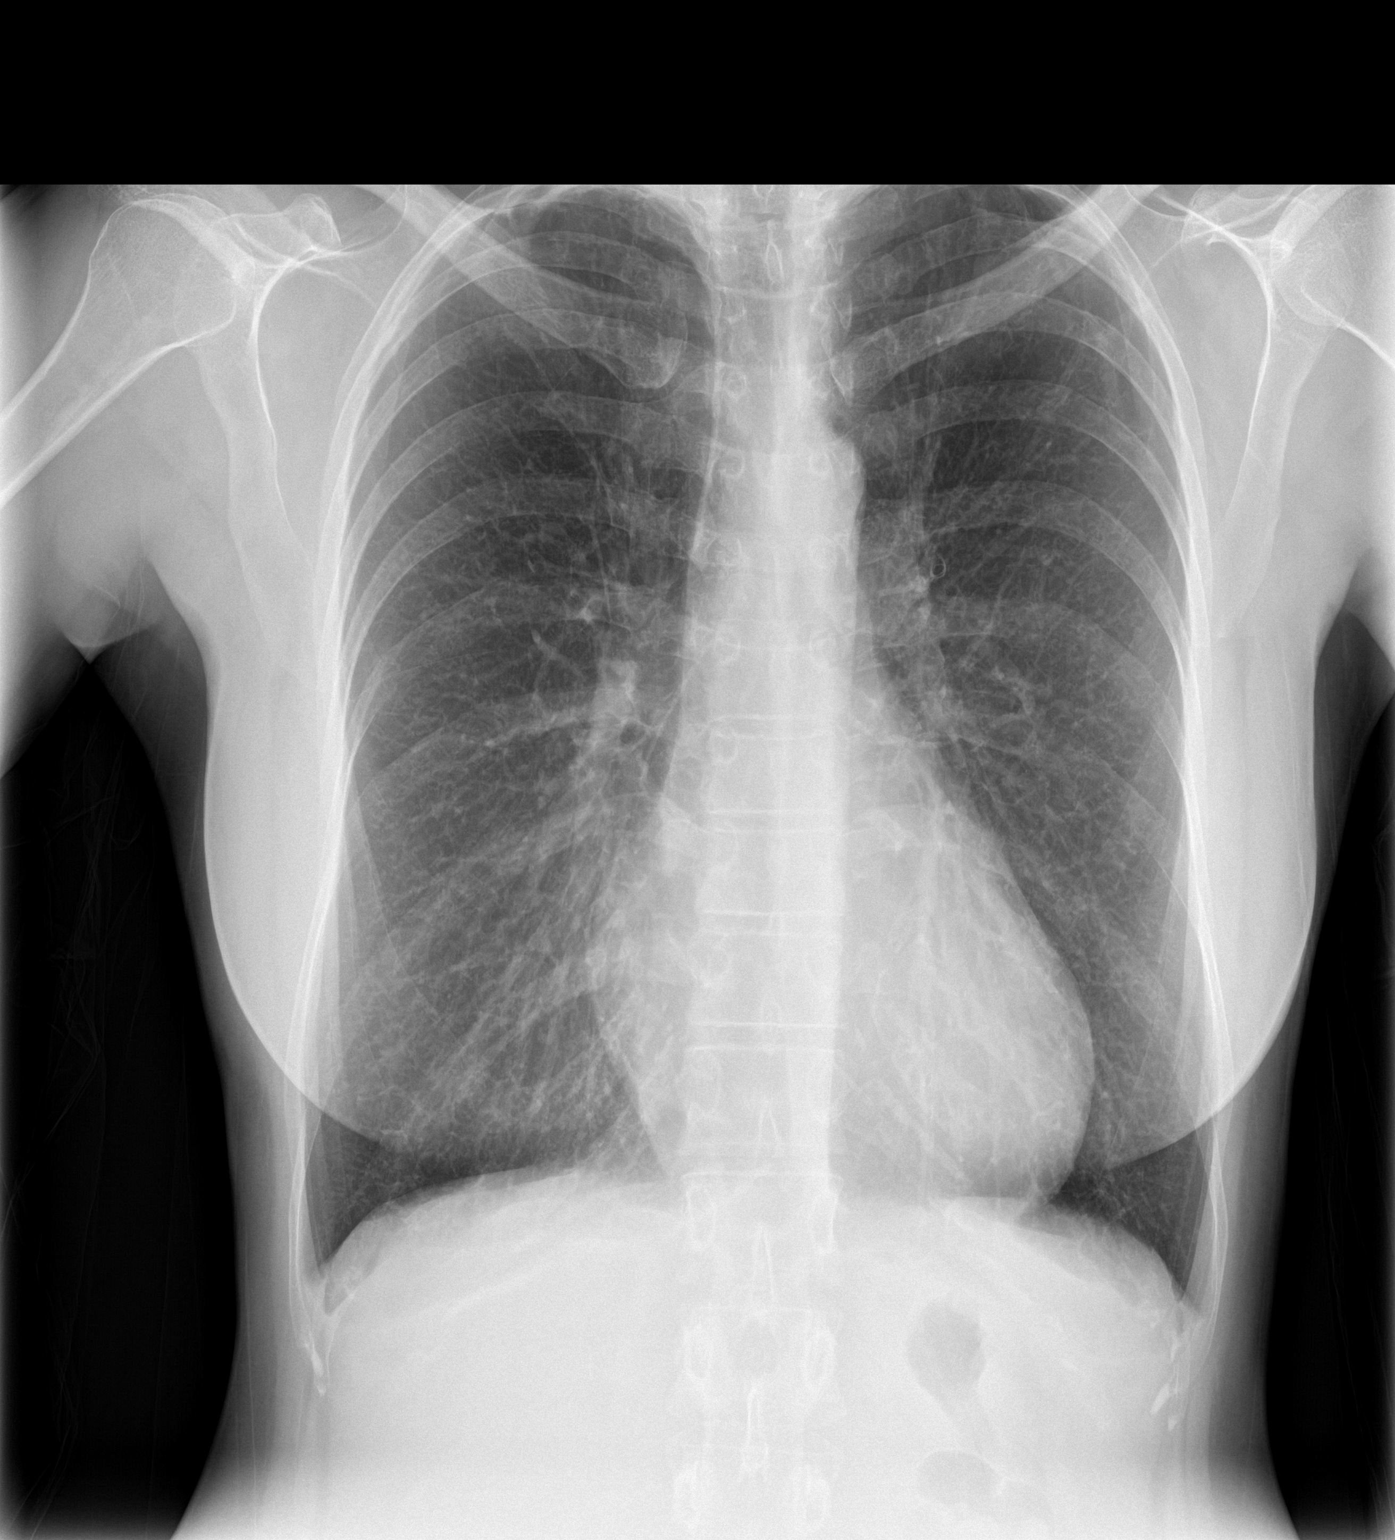

[chest lat]
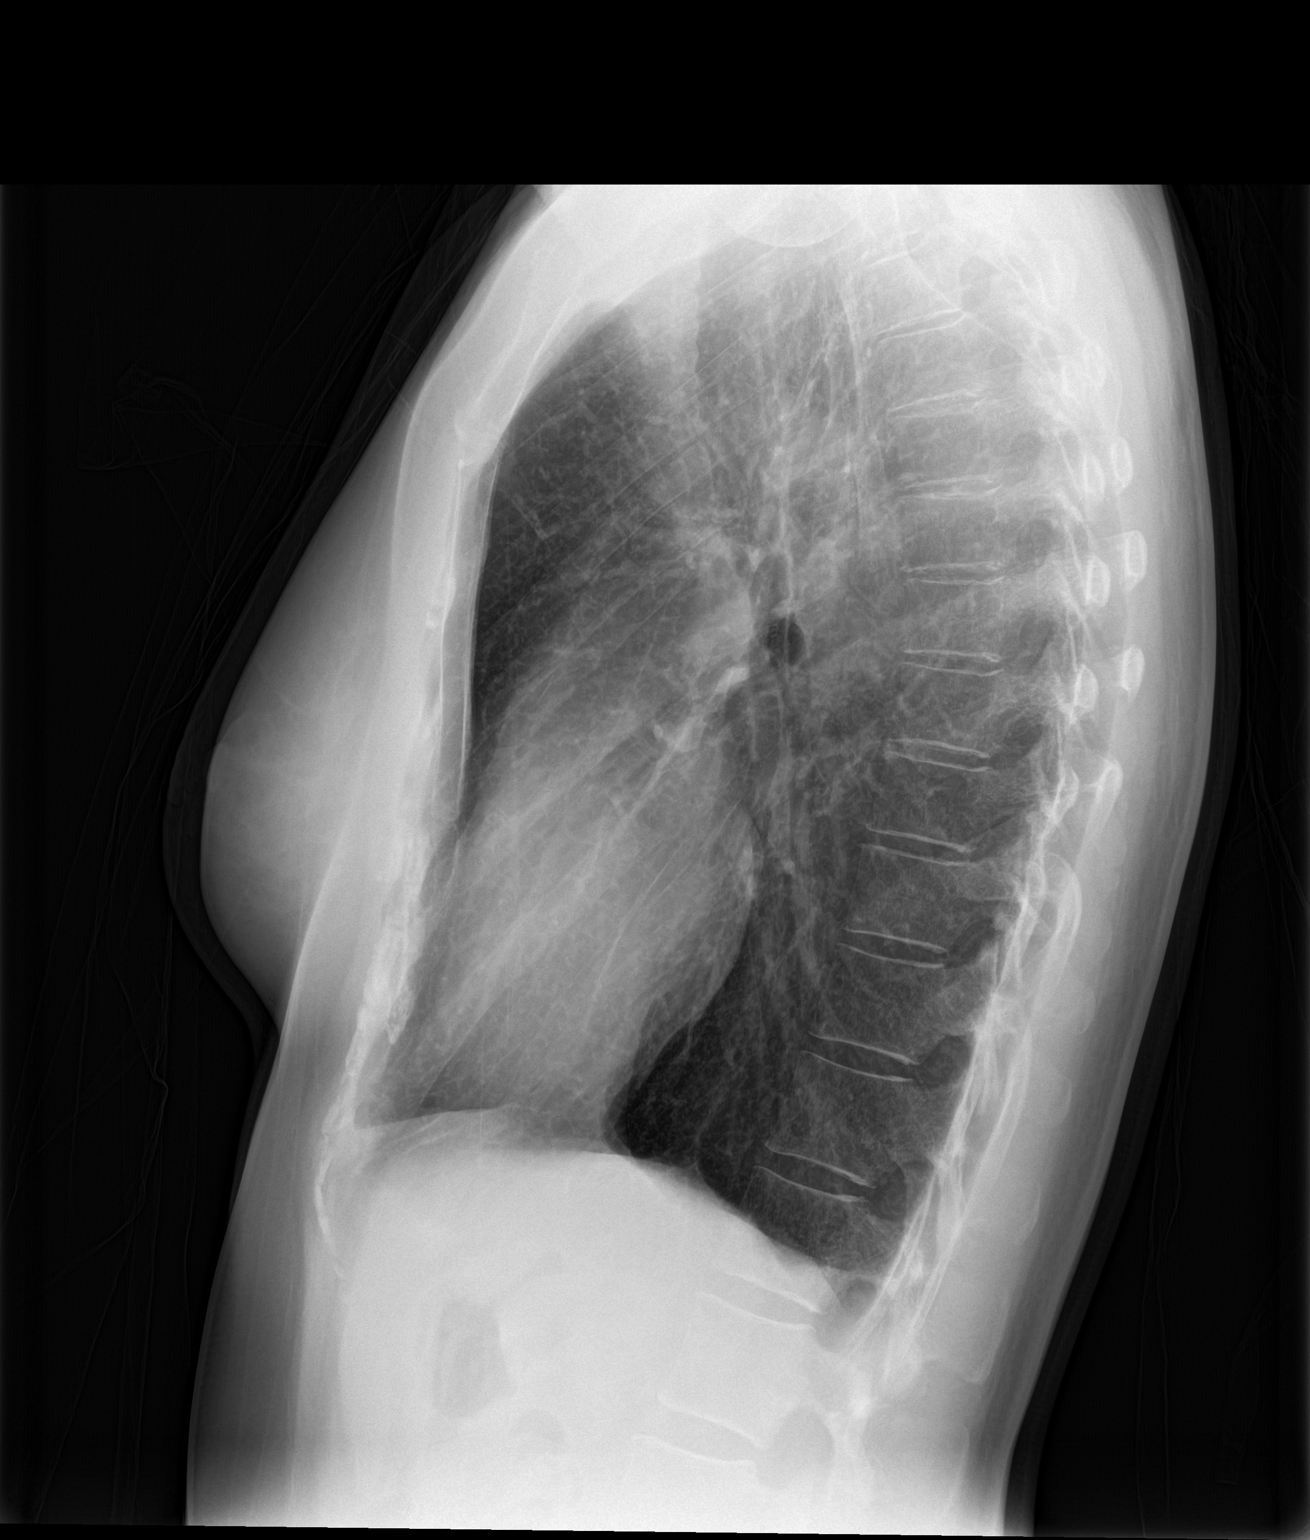

[2 of 2 positions shown; findings below may reference images not displayed]

FINDINGS: Cardiac shadow is stable. The lungs are hyperinflated without focal
infiltrate or sizable effusion. Mild apical scarring is again seen
bilaterally. No acute bony abnormality is noted.
IMPRESSION: No active disease.

## 2016-06-22 ENCOUNTER — Ambulatory Visit (INDEPENDENT_AMBULATORY_CARE_PROVIDER_SITE_OTHER): Payer: BLUE CROSS/BLUE SHIELD | Admitting: Family Medicine

## 2016-06-22 ENCOUNTER — Encounter: Payer: Self-pay | Admitting: Family Medicine

## 2016-06-22 VITALS — BP 120/77 | HR 80 | Temp 97.9°F | Wt 138.0 lb

## 2016-06-22 DIAGNOSIS — J0110 Acute frontal sinusitis, unspecified: Secondary | ICD-10-CM

## 2016-06-22 DIAGNOSIS — F419 Anxiety disorder, unspecified: Secondary | ICD-10-CM

## 2016-06-22 MED ORDER — MECLIZINE HCL 25 MG PO TABS: 25.0000 mg | ORAL_TABLET | Freq: Three times a day (TID) | ORAL | 0 refills | Status: DC | PRN

## 2016-06-22 MED ORDER — BUSPIRONE HCL 7.5 MG PO TABS: 7.5000 mg | ORAL_TABLET | Freq: Two times a day (BID) | ORAL | 0 refills | Status: DC

## 2016-06-22 MED ORDER — AMOXICILLIN-POT CLAVULANATE 875-125 MG PO TABS: 1.0000 | ORAL_TABLET | Freq: Two times a day (BID) | ORAL | 0 refills | Status: DC

## 2016-06-22 MED ORDER — FLUTICASONE FUROATE-VILANTEROL 100-25 MCG/INH IN AEPB: 1.0000 | INHALATION_SPRAY | Freq: Every day | RESPIRATORY_TRACT | 2 refills | Status: DC

## 2016-06-22 MED ORDER — ALBUTEROL SULFATE HFA 108 (90 BASE) MCG/ACT IN AERS: 2.0000 | INHALATION_SPRAY | RESPIRATORY_TRACT | 2 refills | Status: DC | PRN

## 2016-06-22 MED ORDER — KETOROLAC TROMETHAMINE 10 MG PO TABS: 10.0000 mg | ORAL_TABLET | Freq: Four times a day (QID) | ORAL | 0 refills | Status: DC | PRN

## 2016-06-22 NOTE — Progress Notes (Signed)
BP 120/77   Pulse 80   Temp 97.9 F (36.6 C)   Wt 138 lb (62.6 kg)   SpO2 98%   BMI 22.07 kg/m    Subjective:    Patient ID: Lori Davies, female    DOB: 1971-07-31, 45 y.o.   MRN: [id number]  HPI: Lori Davies is a 45 y.o. female  Chief Complaint  Patient presents with  . URI    x 1 week. Head and chest congestion. Bilateral ear pressure. Productive nasty cough.  Fatigue, fever off and on. Left eye twitch. Had strep done at walk in clinic the other week, was negative.  . Menopause    having hot flashes x 3 weeks; mother was in her 7s when she hit menopause.   Patient presents with worsening head and chest congestion with productive cough, b/l ear pressure and dizziness, sinus pressure and HA. Has been having intermittent fevers since onset. Taking advil with minimal relief. Used her breo inhaler today with good relief. Does report some wheezing and chest tightness.   Also states her anxiety seems to be getting worse lately and now she is finding that she rarely even leaves the house because she is so worried about having a panic episode. Was previously on zoloft but it made her too sleepy so she had to come off.   Relevant past medical, surgical, family and social history reviewed and updated as indicated. Interim medical history since our last visit reviewed. Allergies and medications reviewed and updated.  Review of Systems  Constitutional: Positive for chills and fever.  HENT: Positive for congestion, ear pain, rhinorrhea and sinus pressure.   Eyes: Negative.   Respiratory: Positive for cough and wheezing.   Cardiovascular: Negative.   Gastrointestinal: Negative.   Genitourinary: Negative.   Musculoskeletal: Negative.   Skin: Negative.   Neurological: Positive for dizziness.  Psychiatric/Behavioral: The patient is nervous/anxious.     Per HPI unless specifically indicated above     Objective:    BP 120/77   Pulse 80   Temp 97.9 F (36.6 C)   Wt 138 lb  (62.6 kg)   SpO2 98%   BMI 22.07 kg/m   Wt Readings from Last 3 Encounters:  06/22/16 138 lb (62.6 kg)  05/04/16 143 lb (64.9 kg)  10/30/15 140 lb 9.6 oz (63.8 kg)    Physical Exam  Constitutional: She is oriented to person, place, and time. She appears well-developed and well-nourished. No distress.  HENT:  Head: Atraumatic.  Right Ear: External ear normal.  Left Ear: External ear normal.  Mouth/Throat: Oropharynx is clear and moist. No oropharyngeal exudate.  Frontal sinuses TTP b/l Nasal mucosa erythematous Mild effusion in b/l TMs  Eyes: Conjunctivae are normal. No scleral icterus.  Neck: Normal range of motion. Neck supple.  Cardiovascular: Normal rate, regular rhythm and normal heart sounds.   Pulmonary/Chest: Breath sounds normal. No respiratory distress.  Musculoskeletal: Normal range of motion.  Lymphadenopathy:    She has no cervical adenopathy.  Neurological: She is alert and oriented to person, place, and time.  Skin: Skin is warm and dry.  Psychiatric: She has a normal mood and affect. Her behavior is normal.  Nursing note and vitals reviewed.       Assessment & Plan:   Problem List Items Addressed This Visit      Other   Acute anxiety    Previously on zoloft but states it made her sleepy. Will start buspar, follow up in 4 weeks.  Discussed possible side effects.       Relevant Medications   busPIRone (BUSPAR) 7.5 MG tablet    Other Visit Diagnoses    Acute frontal sinusitis, recurrence not specified    -  Primary   Augmentin sent. Refilled inhalers and ketorolac as she states that has helped tremendously in the past and she can't take prednisone.    Relevant Medications   amoxicillin-clavulanate (AUGMENTIN) 875-125 MG tablet    Meclizine sent for dizziness/ear fullness as she states she does not do well with flonase or sudafed.  Discussed that she may feel groggy so try taking it before bed or when she will be home all day.  Declined nebulizer  treatment in office today.  Follow up plan: Return in about 4 weeks (around 07/20/2016) for anxiety.

## 2016-06-22 NOTE — Patient Instructions (Signed)
Buspar - one tablet twice daily for anxiety Augmentin - one tablet twice daily with food Inhalers as needed Meclizine - try at nighttime first as it can potentially make you groggy - for dizziness

## 2016-06-22 NOTE — Assessment & Plan Note (Signed)
Previously on zoloft but states it made her sleepy. Will start buspar, follow up in 4 weeks. Discussed possible side effects.

## 2016-07-20 ENCOUNTER — Ambulatory Visit: Payer: BLUE CROSS/BLUE SHIELD | Admitting: Family Medicine

## 2016-09-25 ENCOUNTER — Telehealth: Payer: Self-pay | Admitting: Family Medicine

## 2016-09-25 MED ORDER — OSELTAMIVIR PHOSPHATE 75 MG PO CAPS
75.0000 mg | ORAL_CAPSULE | Freq: Every day | ORAL | 0 refills | Status: DC
Start: 2016-09-25 — End: 2016-10-26

## 2016-09-25 NOTE — Telephone Encounter (Signed)
Pt called stated her daughter has been diagnosed with the flu, needs preventative Tamiflu called in to her pharmacy. Pharm is [facility] on [address] in [place]. Thanks.

## 2016-09-25 NOTE — Telephone Encounter (Signed)
Rx sent to her pharmacy 

## 2016-09-25 NOTE — Telephone Encounter (Signed)
Routing to provider  

## 2016-10-26 ENCOUNTER — Ambulatory Visit (INDEPENDENT_AMBULATORY_CARE_PROVIDER_SITE_OTHER): Payer: BLUE CROSS/BLUE SHIELD | Admitting: Family Medicine

## 2016-10-26 ENCOUNTER — Encounter: Payer: Self-pay | Admitting: Family Medicine

## 2016-10-26 VITALS — BP 94/66 | HR 72 | Temp 98.5°F | Wt 130.0 lb

## 2016-10-26 DIAGNOSIS — F419 Anxiety disorder, unspecified: Secondary | ICD-10-CM | POA: Diagnosis not present

## 2016-10-26 DIAGNOSIS — J069 Acute upper respiratory infection, unspecified: Secondary | ICD-10-CM

## 2016-10-26 DIAGNOSIS — M79601 Pain in right arm: Secondary | ICD-10-CM | POA: Diagnosis not present

## 2016-10-26 MED ORDER — AMOXICILLIN 875 MG PO TABS: 875.0000 mg | ORAL_TABLET | Freq: Two times a day (BID) | ORAL | 0 refills | Status: DC

## 2016-10-26 MED ORDER — CITALOPRAM HYDROBROMIDE 10 MG PO TABS: 10.0000 mg | ORAL_TABLET | Freq: Every day | ORAL | 0 refills | Status: DC

## 2016-10-26 MED ORDER — PREDNISONE 20 MG PO TABS: 20.0000 mg | ORAL_TABLET | Freq: Every day | ORAL | 0 refills | Status: DC

## 2016-10-26 NOTE — Progress Notes (Signed)
BP 94/66   Pulse 72   Temp 98.5 F (36.9 C)   Wt 130 lb (59 kg)   SpO2 98%   BMI 20.79 kg/m    Subjective:    Patient ID: Lori Davies, female    DOB: 07/10/1971, 46 y.o.   MRN: [id number]  HPI: KLOHIE SEEFELDT is a 46 y.o. female  Chief Complaint  Patient presents with  . Arm Pain    right arm x 5 days. No known injury. Sometimes hurts from elbow down to wrist and sometimes its from shoulder down to wrist. States it's the worst pain she's ever felt.   Right arm x 5 days. Worst at night, throbbing pain in forearm that goes down into fingers. Sometimes starts at top of shoulder and goes down into fingers. Some numbness down to fingers. Trying aleve, icy hot, and her mom's gabapentin with no relief. Worse with use of the arm.   Congestion, chest tightness, rhinorrhea, ear pressure, HA x 2.5 weeks. Denies fever, chills, body aches. Taking mucinex with minimal relief. Several sick contacts lately.   Taking zoloft for panic attacks but it isn't helping and making her really sleepy. States her daughter takes celexa and does not have this issue and she would like to make the switch. Has tried buspar previously and did poorly on that as well due to fatigue.   History reviewed. No pertinent past medical history.  Social History   Social History  . Marital status: Married    Spouse name: N/A  . Number of children: N/A  . Years of education: N/A   Occupational History  . Not on file.   Social History Main Topics  . Smoking status: Former Smoker    Types: Cigarettes    Quit date: 06/04/2014  . Smokeless tobacco: Never Used  . Alcohol use No  . Drug use: No  . Sexual activity: Yes    Birth control/ protection: Injection   Other Topics Concern  . Not on file   Social History Narrative  . No narrative on file    Relevant past medical, surgical, family and social history reviewed and updated as indicated. Interim medical history since our last visit reviewed. Allergies and  medications reviewed and updated.  Review of Systems  Constitutional: Negative.   HENT: Positive for congestion, ear pain and rhinorrhea.   Eyes: Negative.   Respiratory: Positive for cough and chest tightness.   Cardiovascular: Negative.   Gastrointestinal: Negative.   Genitourinary: Negative.   Musculoskeletal: Positive for arthralgias.  Neurological: Negative.   Psychiatric/Behavioral: The patient is nervous/anxious.     Per HPI unless specifically indicated above     Objective:    BP 94/66   Pulse 72   Temp 98.5 F (36.9 C)   Wt 130 lb (59 kg)   SpO2 98%   BMI 20.79 kg/m   Wt Readings from Last 3 Encounters:  10/26/16 130 lb (59 kg)  06/22/16 138 lb (62.6 kg)  05/04/16 143 lb (64.9 kg)    Physical Exam  Constitutional: She is oriented to person, place, and time. She appears well-developed and well-nourished.  HENT:  Head: Atraumatic.  Right Ear: External ear normal.  Left Ear: External ear normal.  Nasal mucosa erythematous Oropharynx erythematous  Eyes: Conjunctivae are normal. Pupils are equal, round, and reactive to light.  Neck: Normal range of motion. Neck supple.  Cardiovascular: Normal rate and normal heart sounds.   Pulmonary/Chest: Effort normal and breath sounds normal. No  respiratory distress.  Musculoskeletal: Normal range of motion. She exhibits no edema or deformity.  Some ttp intermittently across right arm, especially near biceps tendon and above wrist Strength and sensation full and equal b/l UEs  Neurological: She is alert and oriented to person, place, and time.  Skin: Skin is warm and dry.  Psychiatric: She has a normal mood and affect. Her behavior is normal.  Nursing note and vitals reviewed.     Assessment & Plan:   Problem List Items Addressed This Visit      Other   Acute anxiety - Primary    D/c zoloft, start taking low dose citalopram. Discussed that it will likely also cause similar side effects to the other medications like  it that she's tried. Will see her back in 1 month for medication management      Relevant Medications   citalopram (CELEXA) 10 MG tablet    Other Visit Diagnoses    Right arm pain       Suspect tendinitis is causing her arm issues - will try a low dose prednisone and tylenol, then switch back to aleve. Epsom salt soaks, heat, stretches, massage   Upper respiratory tract infection, unspecified type       Pt states plain amoxicillin is the only antibiotic she tolerates well. Will treat with amoxicillin, supportive care discussed. Continue inhaler regimen       Follow up plan: Return in about 4 weeks (around 11/23/2016) for Medication follow-up.

## 2016-10-27 NOTE — Patient Instructions (Signed)
Follow-up in 4 weeks

## 2016-10-27 NOTE — Assessment & Plan Note (Signed)
D/c zoloft, start taking low dose citalopram. Discussed that it will likely also cause similar side effects to the other medications like it that she's tried. Will see her back in 1 month for medication management

## 2016-11-23 ENCOUNTER — Telehealth: Payer: Self-pay | Admitting: *Deleted

## 2016-11-23 ENCOUNTER — Ambulatory Visit: Payer: BLUE CROSS/BLUE SHIELD | Admitting: Family Medicine

## 2016-11-23 MED ORDER — CITALOPRAM HYDROBROMIDE 10 MG PO TABS: 10.0000 mg | ORAL_TABLET | Freq: Every day | ORAL | 0 refills | Status: DC

## 2016-11-23 NOTE — Telephone Encounter (Signed)
Patient is requesting refill of Citalopram 10 mg.

## 2016-11-23 NOTE — Telephone Encounter (Signed)
Please call pt and let her know that she needs follow up for further refills, was supposed to have followed up in 1 month which would have been today.

## 2016-11-23 NOTE — Addendum Note (Signed)
Addended by: Merrie Roof E on: 11/23/2016 04:49 PM   Modules accepted: Orders

## 2016-11-24 NOTE — Telephone Encounter (Signed)
Patient will call back to set up follow up appointment, she was in the middle of something when I called her.

## 2017-02-11 ENCOUNTER — Ambulatory Visit (INDEPENDENT_AMBULATORY_CARE_PROVIDER_SITE_OTHER): Payer: BLUE CROSS/BLUE SHIELD | Admitting: Family Medicine

## 2017-02-11 ENCOUNTER — Encounter: Payer: Self-pay | Admitting: Family Medicine

## 2017-02-11 VITALS — BP 95/66 | HR 84 | Temp 98.4°F | Ht 66.5 in | Wt 122.1 lb

## 2017-02-11 DIAGNOSIS — Z0000 Encounter for general adult medical examination without abnormal findings: Secondary | ICD-10-CM | POA: Diagnosis not present

## 2017-02-11 DIAGNOSIS — J301 Allergic rhinitis due to pollen: Secondary | ICD-10-CM | POA: Diagnosis not present

## 2017-02-11 DIAGNOSIS — F411 Generalized anxiety disorder: Secondary | ICD-10-CM | POA: Diagnosis not present

## 2017-02-11 DIAGNOSIS — N951 Menopausal and female climacteric states: Secondary | ICD-10-CM

## 2017-02-11 DIAGNOSIS — J309 Allergic rhinitis, unspecified: Secondary | ICD-10-CM | POA: Insufficient documentation

## 2017-02-11 MED ORDER — CITALOPRAM HYDROBROMIDE 20 MG PO TABS: 20.0000 mg | ORAL_TABLET | Freq: Every day | ORAL | 3 refills | Status: DC

## 2017-02-11 MED ORDER — FEXOFENADINE HCL 180 MG PO TABS: 180.0000 mg | ORAL_TABLET | Freq: Every day | ORAL | 3 refills | Status: DC

## 2017-02-11 MED ORDER — MONTELUKAST SODIUM 10 MG PO TABS: 10.0000 mg | ORAL_TABLET | Freq: Every day | ORAL | 1 refills | Status: DC

## 2017-02-11 NOTE — Patient Instructions (Addendum)
Health Maintenance, Female Adopting a healthy lifestyle and getting preventive care can go a long way to promote health and wellness. Talk with your health care provider about what schedule of regular examinations is right for you. This is a good chance for you to check in with your provider about disease prevention and staying healthy. In between checkups, there are plenty of things you can do on your own. Experts have done a lot of research about which lifestyle changes and preventive measures are most likely to keep you healthy. Ask your health care provider for more information. Weight and diet Eat a healthy diet  Be sure to include plenty of vegetables, fruits, low-fat dairy products, and lean protein.  Do not eat a lot of foods high in solid fats, added sugars, or salt.  Get regular exercise. This is one of the most important things you can do for your health.  Most adults should exercise for at least 150 minutes each week. The exercise should increase your heart rate and make you sweat (moderate-intensity exercise).  Most adults should also do strengthening exercises at least twice a week. This is in addition to the moderate-intensity exercise. Maintain a healthy weight  Body mass index (BMI) is a measurement that can be used to identify possible weight problems. It estimates body fat based on height and weight. Your health care provider can help determine your BMI and help you achieve or maintain a healthy weight.  For females 47 years of age and older:  A BMI below 18.5 is considered underweight.  A BMI of 18.5 to 24.9 is normal.  A BMI of 25 to 29.9 is considered overweight.  A BMI of 30 and above is considered obese. Watch levels of cholesterol and blood lipids  You should start having your blood tested for lipids and cholesterol at 47 years of age blood tested for lipids and cholesterol at 47 years of age, then have this test every 5 years.  You may need to have your cholesterol levels checked more often if:  Your lipid or  cholesterol levels are high.  You are older than 47 years of age.  You are at high risk for heart disease. Cancer screening Lung Cancer  Lung cancer screening is recommended for adults 47-42 years old who are at high risk for lung cancer because of a history of smoking.  A yearly low-dose CT scan of the lungs is recommended for people who:  Currently smoke.  Have quit within the past 15 years.  Have at least a 30-pack-year history of smoking. A pack year is smoking an average of one pack of cigarettes a day for 1 year.  Yearly screening should continue until it has been 15 years since you quit.  Yearly screening should stop if you develop a health problem that would prevent you from having lung cancer treatment. Breast Cancer  Practice breast self-awareness. This means understanding how your breasts normally appear and feel.  It also means doing regular breast self-exams. Let your health care provider know about any changes, no matter how small.  If you are in your 20s or 30s, you should have a clinical breast exam (CBE) by a health care provider every 1-3 years as part of a regular health exam.  If you are 34 or older, have a CBE every year. Also consider having a breast X-ray (mammogram) every year.  If you have a family history of breast cancer, talk to your health care provider about genetic screening.  If you are at high risk for breast cancer, talk  to your health care provider about having an MRI and a mammogram every year.  Breast cancer gene (BRCA) assessment is recommended for women who have family members with BRCA-related cancers. BRCA-related cancers include:  Breast.  Ovarian.  Tubal.  Peritoneal cancers.  Results of the assessment will determine the need for genetic counseling and BRCA1 and BRCA2 testing. Cervical Cancer  Your health care provider may recommend that you be screened regularly for cancer of the pelvic organs (ovaries, uterus, and vagina).  This screening involves a pelvic examination, including checking for microscopic changes to the surface of your cervix (Pap test). You may be encouraged to have this screening done every 3 years, beginning at age 24.  For women ages 66-65, health care providers may recommend pelvic exams and Pap testing every 3 years, or they may recommend the Pap and pelvic exam, combined with testing for human papilloma virus (HPV), every 5 years. Some types of HPV increase your risk of cervical cancer. Testing for HPV may also be done on women of any age with unclear Pap test results.  Other health care providers may not recommend any screening for nonpregnant women who are considered low risk for pelvic cancer and who do not have symptoms. Ask your health care provider if a screening pelvic exam is right for you.  If you have had past treatment for cervical cancer or a condition that could lead to cancer, you need Pap tests and screening for cancer for at least 20 years after your treatment. If Pap tests have been discontinued, your risk factors (such as having a new sexual partner) need to be reassessed to determine if screening should resume. Some women have medical problems that increase the chance of getting cervical cancer. In these cases, your health care provider may recommend more frequent screening and Pap tests. Colorectal Cancer  This type of cancer can be detected and often prevented.  Routine colorectal cancer screening usually begins at 47 years of age and continues through 47 years of age.  Your health care provider may recommend screening at an earlier age if you have risk factors for colon cancer.  Your health care provider may also recommend using home test kits to check for hidden blood in the stool.  A small camera at the end of a tube can be used to examine your colon directly (sigmoidoscopy or colonoscopy). This is done to check for the earliest forms of colorectal cancer.  Routine  screening usually begins at age 47.  Direct examination of the colon should be repeated every 5-10 years through 47 years of age. However, you may need to be screened more often if early forms of precancerous polyps or small growths are found. Skin Cancer  Check your skin from head to toe regularly.  Tell your health care provider about any new moles or changes in moles, especially if there is a change in a mole's shape or color.  Also tell your health care provider if you have a mole that is larger than the size of a pencil eraser.  Always use sunscreen. Apply sunscreen liberally and repeatedly throughout the day.  Protect yourself by wearing long sleeves, pants, a wide-brimmed hat, and sunglasses whenever you are outside. Heart disease, diabetes, and high blood pressure  High blood pressure causes heart disease and increases the risk of stroke. High blood pressure is more likely to develop in:  People who have blood pressure in the high end of the normal range (130-139/85-89 mm Hg).  People who are overweight or obese.  People who are African American.  If you are 59-24 years of age, have your blood pressure checked every 3-5 years. If you are 34 years of age or older, have your blood pressure checked every year. You should have your blood pressure measured twice-once when you are at a hospital or clinic, and once when you are not at a hospital or clinic. Record the average of the two measurements. To check your blood pressure when you are not at a hospital or clinic, you can use:  An automated blood pressure machine at a pharmacy.  A home blood pressure monitor.  If you are between 29 years and 60 years old, ask your health care provider if you should take aspirin to prevent strokes.  Have regular diabetes screenings. This involves taking a blood sample to check your fasting blood sugar level.  If you are at a normal weight and have a low risk for diabetes, have this test once  every three years after 47 years of age.  If you are overweight and have a high risk for diabetes, consider being tested at a younger age or more often. Preventing infection Hepatitis B  If you have a higher risk for hepatitis B, you should be screened for this virus. You are considered at high risk for hepatitis B if:  You were born in a country where hepatitis B is common. Ask your health care provider which countries are considered high risk.  Your parents were born in a high-risk country, and you have not been immunized against hepatitis B (hepatitis B vaccine).  You have HIV or AIDS.  You use needles to inject street drugs.  You live with someone who has hepatitis B.  You have had sex with someone who has hepatitis B.  You get hemodialysis treatment.  You take certain medicines for conditions, including cancer, organ transplantation, and autoimmune conditions. Hepatitis C  Blood testing is recommended for:  Everyone born from [birth year] through 1966.  Anyone with known risk factors for hepatitis C. Sexually transmitted infections (STIs)  You should be screened for sexually transmitted infections (STIs) including gonorrhea and chlamydia if:  You are sexually active and are younger than 47 years of age.  You are older than 47 years of age and your health care provider tells you that you are at risk for this type of infection.  Your sexual activity has changed since you were last screened and you are at an increased risk for chlamydia or gonorrhea. Ask your health care provider if you are at risk.  If you do not have HIV, but are at risk, it may be recommended that you take a prescription medicine daily to prevent HIV infection. This is called pre-exposure prophylaxis (PrEP). You are considered at risk if:  You are sexually active and do not regularly use condoms or know the HIV status of your partner(s).  You take drugs by injection.  You are sexually active with a partner  who has HIV. Talk with your health care provider about whether you are at high risk of being infected with HIV. If you choose to begin PrEP, you should first be tested for HIV. You should then be tested every 3 months for as long as you are taking PrEP. Pregnancy  If you are premenopausal and you may become pregnant, ask your health care provider about preconception counseling.  If you may become pregnant, take 400 to 800 micrograms (mcg) of folic acid  every day.  If you want to prevent pregnancy, talk to your health care provider about birth control (contraception). Osteoporosis and menopause  Osteoporosis is a disease in which the bones lose minerals and strength with aging. This can result in serious bone fractures. Your risk for osteoporosis can be identified using a bone density scan.  If you are 4 years of age or older, or if you are at risk for osteoporosis and fractures, ask your health care provider if you should be screened.  Ask your health care provider whether you should take a calcium or vitamin D supplement to lower your risk for osteoporosis.  Menopause may have certain physical symptoms and risks.  Hormone replacement therapy may reduce some of these symptoms and risks. Talk to your health care provider about whether hormone replacement therapy is right for you. Follow these instructions at home:  Schedule regular health, dental, and eye exams.  Stay current with your immunizations.  Do not use any tobacco products including cigarettes, chewing tobacco, or electronic cigarettes.  If you are pregnant, do not drink alcohol.  If you are breastfeeding, limit how much and how often you drink alcohol.  Limit alcohol intake to no more than 1 drink per day for nonpregnant women. One drink equals 12 ounces of beer, 5 ounces of wine, or 1 ounces of hard liquor.  Do not use street drugs.  Do not share needles.  Ask your health care provider for help if you need support  or information about quitting drugs.  Tell your health care provider if you often feel depressed.  Tell your health care provider if you have ever been abused or do not feel safe at home. This information is not intended to replace advice given to you by your health care provider. Make sure you discuss any questions you have with your health care provider. Document Released: 03/23/2011 Document Revised: 02/13/2016 Document Reviewed: 06/11/2015 Elsevier Interactive Patient Education  2017 [employer].

## 2017-02-11 NOTE — Assessment & Plan Note (Signed)
Will restart her singulair and allegra. Rx sent to her pharmacy. Call with any concerns.

## 2017-02-11 NOTE — Progress Notes (Signed)
BP 95/66 (BP Location: Left Arm, Patient Position: Sitting, Cuff Size: Small)   Pulse 84   Temp 98.4 F (36.9 C)   Ht 5' 6.5" (1.689 m)   Wt 122 lb 1.6 oz (55.4 kg)   LMP  (LMP Unknown)   SpO2 99%   BMI 19.41 kg/m    Subjective:    Patient ID: Lori Davies, female    DOB: 29-Mar-1971, 46 y.o.   MRN: [id number]  HPI: Lori Davies is a 46 y.o. female presenting on 02/11/2017 for comprehensive medical examination. Current medical complaints include:  ANXIETY/STRESS Duration:uncontrolled Anxious mood: yes  Excessive worrying: yes Irritability: no  Sweating: no Nausea: no Palpitations:no Hyperventilation: no Panic attacks: no Agoraphobia: no  Obscessions/compulsions: no Depressed mood: no Depression screen [facility] 2/9 02/11/2017 10/30/2015  Decreased Interest 0 0  Down, Depressed, Hopeless 0 0  PHQ - 2 Score 0 0  Altered sleeping 0 1  Tired, decreased energy 1 1  Change in appetite 0 0  Feeling bad or failure about yourself  0 0  Trouble concentrating 0 0  Moving slowly or fidgety/restless 0 0  Suicidal thoughts 0 0  PHQ-9 Score 1 2  Difficult doing work/chores - Not difficult at all   Anhedonia: no Weight changes: yes Insomnia: no   Hypersomnia: no Fatigue/loss of energy: no Feelings of worthlessness: no Feelings of guilt: no Impaired concentration/indecisiveness: no Suicidal ideations: no  Crying spells: no Recent Stressors/Life Changes: no  She currently lives with: husband and kids Menopausal Symptoms: yes- having hot flashes  Depression Screen done today and results listed below:  Depression screen [facility] 2/9 02/11/2017 10/30/2015  Decreased Interest 0 0  Down, Depressed, Hopeless 0 0  PHQ - 2 Score 0 0  Altered sleeping 0 1  Tired, decreased energy 1 1  Change in appetite 0 0  Feeling bad or failure about yourself  0 0  Trouble concentrating 0 0  Moving slowly or fidgety/restless 0 0  Suicidal thoughts 0 0  PHQ-9 Score 1 2  Difficult doing work/chores  - Not difficult at all    Past Medical History:  History reviewed. No pertinent past medical history.  Surgical History:  History reviewed. No pertinent surgical history.  Medications:  Current Outpatient Prescriptions on File Prior to Visit  Medication Sig  . albuterol (PROVENTIL HFA;VENTOLIN HFA) 108 (90 Base) MCG/ACT inhaler Inhale 2 puffs into the lungs every 4 (four) hours as needed for wheezing or shortness of breath.  . fluticasone furoate-vilanterol (BREO ELLIPTA) 100-25 MCG/INH AEPB Inhale 1 puff into the lungs daily.   No current facility-administered medications on file prior to visit.     Allergies:  Allergies  Allergen Reactions  . Doxycycline Nausea And Vomiting  . Fish Oil Hives  . Prednisone Other (See Comments)    Numbness in hands    Social History:  Social History   Social History  . Marital status: Married    Spouse name: N/A  . Number of children: N/A  . Years of education: N/A   Occupational History  . Not on file.   Social History Main Topics  . Smoking status: Former Smoker    Types: Cigarettes    Quit date: 06/04/2014  . Smokeless tobacco: Never Used  . Alcohol use No  . Drug use: No  . Sexual activity: Yes    Birth control/ protection: Injection   Other Topics Concern  . Not on file   Social History Narrative  . No narrative  on file   History  Smoking Status  . Former Smoker  . Types: Cigarettes  . Quit date: 06/04/2014  Smokeless Tobacco  . Never Used   History  Alcohol Use No    Family History:  Family History  Problem Relation Age of Onset  . Heart disease Mother   . Lung disease Mother   . Heart disease Father   . Hypertension Father   . Autism Son     Past medical history, surgical history, medications, allergies, family history and social history reviewed with patient today and changes made to appropriate areas of the chart.   Review of Systems  Constitutional: Negative.   HENT: Negative.   Eyes: Negative.    Respiratory: Positive for cough, shortness of breath and wheezing. Negative for hemoptysis and sputum production.   Cardiovascular: Negative.   Gastrointestinal: Negative.   Genitourinary: Negative.   Musculoskeletal: Negative.   Skin: Negative.   Neurological: Negative.   Endo/Heme/Allergies: Positive for environmental allergies. Negative for polydipsia. Does not bruise/bleed easily.  Psychiatric/Behavioral: Negative for depression, hallucinations, memory loss, substance abuse and suicidal ideas. The patient is nervous/anxious. The patient does not have insomnia.     All other ROS negative except what is listed above and in the HPI.      Objective:    BP 95/66 (BP Location: Left Arm, Patient Position: Sitting, Cuff Size: Small)   Pulse 84   Temp 98.4 F (36.9 C)   Ht 5' 6.5" (1.689 m)   Wt 122 lb 1.6 oz (55.4 kg)   LMP  (LMP Unknown)   SpO2 99%   BMI 19.41 kg/m   Wt Readings from Last 3 Encounters:  02/11/17 122 lb 1.6 oz (55.4 kg)  10/26/16 130 lb (59 kg)  06/22/16 138 lb (62.6 kg)    Physical Exam  Constitutional: She is oriented to person, place, and time. She appears well-developed and well-nourished. No distress.  HENT:  Head: Normocephalic and atraumatic.  Right Ear: Hearing, tympanic membrane, external ear and ear canal normal.  Left Ear: Hearing, tympanic membrane, external ear and ear canal normal.  Nose: Nose normal.  Mouth/Throat: Uvula is midline, oropharynx is clear and moist and mucous membranes are normal. No oropharyngeal exudate.  Eyes: Conjunctivae, EOM and lids are normal. Pupils are equal, round, and reactive to light. Right eye exhibits no discharge. Left eye exhibits no discharge. No scleral icterus.  Neck: Normal range of motion. Neck supple. No JVD present. No tracheal deviation present. No thyromegaly present.  Cardiovascular: Normal rate, regular rhythm, normal heart sounds and intact distal pulses.  Exam reveals no gallop and no friction rub.     No murmur heard. Pulmonary/Chest: Effort normal and breath sounds normal. No stridor. No respiratory distress. She has no wheezes. She has no rales. She exhibits no tenderness.  Abdominal: Soft. Bowel sounds are normal. She exhibits no distension and no mass. There is no tenderness. There is no rebound and no guarding.  Genitourinary:  Genitourinary Comments: Pelvic and breast exams deferred- done at GYN  Musculoskeletal: Normal range of motion. She exhibits no edema, tenderness or deformity.  Lymphadenopathy:    She has no cervical adenopathy.  Neurological: She is alert and oriented to person, place, and time. She has normal reflexes. She displays normal reflexes. No cranial nerve deficit. She exhibits normal muscle tone. Coordination normal.  Skin: Skin is warm, dry and intact. No rash noted. She is not diaphoretic. No erythema. No pallor.  Psychiatric: She has a normal  mood and affect. Her speech is normal and behavior is normal. Judgment and thought content normal. Cognition and memory are normal.  Nursing note and vitals reviewed.   Results for orders placed or performed in visit on 10/04/15  CBC With Differential/Platelet  Result Value Ref Range   WBC 8.8 3.4 - 10.8 x10E3/uL   RBC 4.92 3.77 - 5.28 x10E6/uL   Hemoglobin 14.8 11.1 - 15.9 g/dL   Hematocrit 42.7 34.0 - 46.6 %   MCV 87 79 - 97 fL   MCH 30.1 26.6 - 33.0 pg   MCHC 34.7 31.5 - 35.7 g/dL   RDW 13.5 12.3 - 15.4 %   Platelets 332 150 - 379 x10E3/uL   Neutrophils 62 %   Lymphs 28 %   MID 10 %   Neutrophils Absolute 5.4 1.4 - 7.0 x10E3/uL   Lymphocytes Absolute 2.5 0.7 - 3.1 x10E3/uL   MID (Absolute) 0.9 0.1 - 1.6 X10E3/uL  Thyroid Panel With TSH  Result Value Ref Range   TSH 2.090 0.450 - 4.500 uIU/mL   T4, Total 8.1 4.5 - 12.0 ug/dL   T3 Uptake Ratio 29 24 - 39 %   Free Thyroxine Index 2.3 1.2 - 4.9  Comprehensive metabolic panel  Result Value Ref Range   Glucose 77 65 - 99 mg/dL   BUN 11 6 - 24 mg/dL    Creatinine, Ser 0.71 0.57 - 1.00 mg/dL   GFR calc non Af Amer 103 >59 mL/min/1.73   GFR calc Af Amer 119 >59 mL/min/1.73   BUN/Creatinine Ratio 15 9 - 23   Sodium 142 134 - 144 mmol/L   Potassium 4.5 3.5 - 5.2 mmol/L   Chloride 103 96 - 106 mmol/L   CO2 22 18 - 29 mmol/L   Calcium 9.2 8.7 - 10.2 mg/dL   Total Protein 6.5 6.0 - 8.5 g/dL   Albumin 4.3 3.5 - 5.5 g/dL   Globulin, Total 2.2 1.5 - 4.5 g/dL   Albumin/Globulin Ratio 2.0 1.1 - 2.5   Bilirubin Total <0.2 0.0 - 1.2 mg/dL   Alkaline Phosphatase 76 39 - 117 IU/L   AST 14 0 - 40 IU/L   ALT 8 0 - 32 IU/L      Assessment & Plan:   Problem List Items Addressed This Visit      Respiratory   Allergic rhinitis    Will restart her singulair and allegra. Rx sent to her pharmacy. Call with any concerns.         Other   Acute anxiety    Not doing great, but better. Will increase celexa to 20mg  and recheck in 1-2 months. Call with any concerns.       Relevant Medications   citalopram (CELEXA) 20 MG tablet    Other Visit Diagnoses    Routine general medical examination at a health care facility    -  Primary   Vaccines declined. Screening labs checked today. Pap and Mammogram through GYN- records release signed. Continue diet and exercise. Call with any concerns.    Relevant Orders   CBC with Differential/Platelet   Comprehensive metabolic panel   Lipid Panel w/o Chol/HDL Ratio   TSH   UA/M w/rflx Culture, Routine   Perimenopause       Will check labs. Await results. Call with any concerns.    Relevant Orders   LH   Rayle   Estradiol       Follow up plan: Return 6-8 weeks, follow up mood, for Records  release GYN please.   LABORATORY TESTING:  - Pap smear: Done at GYN  IMMUNIZATIONS:   - Tdap: Tetanus vaccination status reviewed: Refused. - Influenza: Postponed to flu season - Pneumovax: Refused - Prevnar: Not applicable - Zostavax vaccine: Not applicable  SCREENING: -Mammogram: Done at GYN   PATIENT  COUNSELING:   Advised to take 1 mg of folate supplement per day if capable of pregnancy.   Sexuality: Discussed sexually transmitted diseases, partner selection, use of condoms, avoidance of unintended pregnancy  and contraceptive alternatives.   Advised to avoid cigarette smoking.  I discussed with the patient that most people either abstain from alcohol or drink within safe limits (<=14/week and <=4 drinks/occasion for males, <=7/weeks and <= 3 drinks/occasion for females) and that the risk for alcohol disorders and other health effects rises proportionally with the number of drinks per week and how often a drinker exceeds daily limits.  Discussed cessation/primary prevention of drug use and availability of treatment for abuse.   Diet: Encouraged to adjust caloric intake to maintain  or achieve ideal body weight, to reduce intake of dietary saturated fat and total fat, to limit sodium intake by avoiding high sodium foods and not adding table salt, and to maintain adequate dietary potassium and calcium preferably from fresh fruits, vegetables, and low-fat dairy products.    stressed the importance of regular exercise  Injury prevention: Discussed safety belts, safety helmets, smoke detector, smoking near bedding or upholstery.   Dental health: Discussed importance of regular tooth brushing, flossing, and dental visits.    NEXT PREVENTATIVE PHYSICAL DUE IN 1 YEAR. Return 6-8 weeks, follow up mood, for Records release GYN please.

## 2017-02-11 NOTE — Assessment & Plan Note (Signed)
Not doing great, but better. Will increase celexa to 20mg  and recheck in 1-2 months. Call with any concerns.

## 2017-02-12 LAB — COMPREHENSIVE METABOLIC PANEL
A/G RATIO: 1.8 (ref 1.2–2.2)
ALK PHOS: 89 IU/L (ref 39–117)
ALT: 9 IU/L (ref 0–32)
AST: 14 IU/L (ref 0–40)
Albumin: 4.6 g/dL (ref 3.5–5.5)
BUN/Creatinine Ratio: 13 (ref 9–23)
BUN: 11 mg/dL (ref 6–24)
Bilirubin Total: 0.2 mg/dL (ref 0.0–1.2)
CALCIUM: 10.1 mg/dL (ref 8.7–10.2)
CO2: 25 mmol/L (ref 18–29)
Chloride: 103 mmol/L (ref 96–106)
Creatinine, Ser: 0.85 mg/dL (ref 0.57–1.00)
GFR calc Af Amer: 94 mL/min/{1.73_m2} (ref >59)
GFR, EST NON AFRICAN AMERICAN: 82 mL/min/{1.73_m2} (ref >59)
GLOBULIN, TOTAL: 2.6 g/dL (ref 1.5–4.5)
Glucose: 100 mg/dL — ABNORMAL HIGH (ref 65–99)
POTASSIUM: 4.7 mmol/L (ref 3.5–5.2)
SODIUM: 143 mmol/L (ref 134–144)
Total Protein: 7.2 g/dL (ref 6.0–8.5)

## 2017-02-12 LAB — CBC WITH DIFFERENTIAL/PLATELET
Basophils Absolute: 0.1 10*3/uL (ref 0.0–0.2)
Basos: 1 %
EOS (ABSOLUTE): 0.3 10*3/uL (ref 0.0–0.4)
Eos: 4 %
HEMATOCRIT: 44.3 % (ref 34.0–46.6)
Hemoglobin: 14.7 g/dL (ref 11.1–15.9)
IMMATURE GRANULOCYTES: 0 %
Immature Grans (Abs): 0 10*3/uL (ref 0.0–0.1)
LYMPHS ABS: 2.1 10*3/uL (ref 0.7–3.1)
Lymphs: 30 %
MCH: 28.3 pg (ref 26.6–33.0)
MCHC: 33.2 g/dL (ref 31.5–35.7)
MCV: 85 fL (ref 79–97)
MONOS ABS: 0.4 10*3/uL (ref 0.1–0.9)
Monocytes: 6 %
NEUTROS PCT: 59 %
Neutrophils Absolute: 4.3 10*3/uL (ref 1.4–7.0)
PLATELETS: 334 10*3/uL (ref 150–379)
RBC: 5.2 x10E6/uL (ref 3.77–5.28)
RDW: 13.7 % (ref 12.3–15.4)
WBC: 7.2 10*3/uL (ref 3.4–10.8)

## 2017-02-12 LAB — LIPID PANEL W/O CHOL/HDL RATIO
CHOLESTEROL TOTAL: 157 mg/dL (ref 100–199)
HDL: 54 mg/dL (ref >39)
LDL Calculated: 68 mg/dL (ref 0–99)
TRIGLYCERIDES: 177 mg/dL — AB (ref 0–149)
VLDL Cholesterol Cal: 35 mg/dL (ref 5–40)

## 2017-02-12 LAB — TSH: TSH: 1.46 u[IU]/mL (ref 0.450–4.500)

## 2017-02-13 LAB — URINE CULTURE, REFLEX: ORGANISM ID, BACTERIA: NO GROWTH

## 2017-02-13 LAB — UA/M W/RFLX CULTURE, ROUTINE
Bilirubin, UA: NEGATIVE
Glucose, UA: NEGATIVE
Ketones, UA: NEGATIVE
NITRITE UA: NEGATIVE
PH UA: 7 (ref 5.0–7.5)
RBC UA: NEGATIVE
Specific Gravity, UA: 1.015 (ref 1.005–1.030)
UUROB: 0.2 mg/dL (ref 0.2–1.0)

## 2017-02-13 LAB — MICROSCOPIC EXAMINATION
BACTERIA UA: NONE SEEN
RBC MICROSCOPIC, UA: NONE SEEN /HPF (ref 0–?)

## 2017-02-17 ENCOUNTER — Encounter: Payer: Self-pay | Admitting: Family Medicine

## 2017-02-18 LAB — LUTEINIZING HORMONE: LH: 63.3 m[IU]/mL

## 2017-02-18 LAB — ESTRADIOL: Estradiol: <5 pg/mL

## 2017-02-18 LAB — SPECIMEN STATUS REPORT

## 2017-02-18 LAB — FOLLICLE STIMULATING HORMONE: FSH: 156.4 m[IU]/mL

## 2017-02-24 ENCOUNTER — Encounter: Payer: Self-pay | Admitting: Family Medicine

## 2017-05-04 ENCOUNTER — Ambulatory Visit: Payer: BLUE CROSS/BLUE SHIELD | Admitting: Family Medicine

## 2017-05-21 ENCOUNTER — Ambulatory Visit: Payer: BLUE CROSS/BLUE SHIELD | Admitting: Family Medicine

## 2017-07-09 ENCOUNTER — Ambulatory Visit (INDEPENDENT_AMBULATORY_CARE_PROVIDER_SITE_OTHER): Payer: BLUE CROSS/BLUE SHIELD | Admitting: Family Medicine

## 2017-07-09 ENCOUNTER — Encounter: Payer: Self-pay | Admitting: Family Medicine

## 2017-07-09 VITALS — BP 98/67 | HR 81 | Temp 98.0°F | Wt 120.0 lb

## 2017-07-09 DIAGNOSIS — J019 Acute sinusitis, unspecified: Secondary | ICD-10-CM

## 2017-07-09 MED ORDER — PREDNISONE 10 MG PO TABS
ORAL_TABLET | ORAL | 0 refills | Status: DC
Start: 2017-07-09 — End: 2017-11-01

## 2017-07-09 MED ORDER — AMOXICILLIN-POT CLAVULANATE 875-125 MG PO TABS: 1.0000 | ORAL_TABLET | Freq: Two times a day (BID) | ORAL | 0 refills | Status: DC

## 2017-07-09 MED ORDER — FLUTICASONE PROPIONATE 50 MCG/ACT NA SUSP: 2.0000 | Freq: Every day | NASAL | 6 refills | Status: DC

## 2017-07-09 NOTE — Progress Notes (Signed)
   BP 98/67   Pulse 81   Temp 98 F (36.7 C)   Wt 120 lb (54.4 kg)   SpO2 99%   BMI 19.08 kg/m    Subjective:    Patient ID: Lori Davies, female    DOB: 27-Feb-1971, 46 y.o.   MRN: [id number]  HPI: Lori Davies is a 46 y.o. female  Chief Complaint  Patient presents with  . Sinusitis    x 4 days, head/nasal/chest congestion, sinus drainage, productive cough. No known fever, no sore throat, no headache, no ear ache.   5 day hx of congestion, sinus pain and pressure, productive cough, malaise. Subjective fever and chills last night. Denies sore throat, ear pain, CP, SOB. Taking sudafed and OTC nasal sprays with no relief. No sick contacts lately.   Relevant past medical, surgical, family and social history reviewed and updated as indicated. Interim medical history since our last visit reviewed. Allergies and medications reviewed and updated.  Review of Systems  Constitutional: Positive for fatigue.  HENT: Positive for congestion, sinus pain and sinus pressure.   Eyes: Negative.   Respiratory: Positive for cough.   Cardiovascular: Negative.   Gastrointestinal: Negative.   Musculoskeletal: Negative.   Neurological: Negative.   Psychiatric/Behavioral: Negative.    Per HPI unless specifically indicated above     Objective:    BP 98/67   Pulse 81   Temp 98 F (36.7 C)   Wt 120 lb (54.4 kg)   SpO2 99%   BMI 19.08 kg/m   Wt Readings from Last 3 Encounters:  07/09/17 120 lb (54.4 kg)  02/11/17 122 lb 1.6 oz (55.4 kg)  10/26/16 130 lb (59 kg)    Physical Exam  Constitutional: She is oriented to person, place, and time. She appears well-developed and well-nourished. No distress.  HENT:  Head: Atraumatic.  Thick drainage present in nares B/l sinus ttp Oropharynx injected  Eyes: Pupils are equal, round, and reactive to light. Conjunctivae are normal.  Neck: Normal range of motion. Neck supple.  Cardiovascular: Normal rate and normal heart sounds.     Pulmonary/Chest: Effort normal. No respiratory distress.  Musculoskeletal: Normal range of motion.  Neurological: She is alert and oriented to person, place, and time.  Skin: Skin is warm and dry.  Psychiatric: She has a normal mood and affect. Her behavior is normal.  Nursing note and vitals reviewed.     Assessment & Plan:   Problem List Items Addressed This Visit    None    Visit Diagnoses    Acute sinusitis, recurrence not specified, unspecified location    -  Primary   Discussed sinus rinses, continued OTC remedies. Start augmentin if no improvement over weekend. F/u if no improvement   Relevant Medications   fluticasone (FLONASE) 50 MCG/ACT nasal spray   predniSONE (DELTASONE) 10 MG tablet   amoxicillin-clavulanate (AUGMENTIN) 875-125 MG tablet       Follow up plan: Return if symptoms worsen or fail to improve.

## 2017-07-11 NOTE — Patient Instructions (Signed)
Follow up as needed

## 2017-07-13 ENCOUNTER — Ambulatory Visit: Payer: Self-pay

## 2017-07-13 ENCOUNTER — Other Ambulatory Visit: Payer: Self-pay | Admitting: Family Medicine

## 2017-07-13 ENCOUNTER — Telehealth: Payer: Self-pay | Admitting: Family Medicine

## 2017-07-13 MED ORDER — ALBUTEROL SULFATE HFA 108 (90 BASE) MCG/ACT IN AERS: 2.0000 | INHALATION_SPRAY | RESPIRATORY_TRACT | 2 refills | Status: DC | PRN

## 2017-07-13 MED ORDER — FLUTICASONE FUROATE-VILANTEROL 100-25 MCG/INH IN AEPB: 1.0000 | INHALATION_SPRAY | Freq: Every day | RESPIRATORY_TRACT | 2 refills | Status: DC

## 2017-07-13 MED ORDER — AMOXICILLIN 875 MG PO TABS: 875.0000 mg | ORAL_TABLET | Freq: Two times a day (BID) | ORAL | 0 refills | Status: DC

## 2017-07-13 NOTE — Telephone Encounter (Signed)
I will order Albuterol and Breo.  Please let me know if she can't take her antibiotic

## 2017-07-13 NOTE — Telephone Encounter (Signed)
Called and spoke to patient. I let her know about inhalers. Patient states that she would like a different antibiotic because the one she is currently taking is causing a lot of heart burn. Patient states that plain amoxicillin has worked and not caused heartburn in the part.

## 2017-07-13 NOTE — Telephone Encounter (Signed)
Pt. reported she was seen in the MD office on Friday, 10/19 for URI.  Today, c/o chest tightness, and feels she needs to have Breo Inhaler ordered.  Coughing up "yellow green, thick sputum."  Denied fever. Denied shortness of breath.  Denied chest pain.  Described that her chest feels like it is tightening up, but if she walks, she can tell the tightness loosens up.  Also c/o a stuffy nose.  Reported the Flonase does not help.    Also, stated the Augmentin is causing heartburn, and unable to tolerate it.  Stated that she has been taking the antibiotic until today.  Is requesting a change in the antibiotic.  Reported allergy to Sulfa antibiotics.    [place]; spoke with Linard Millers.  Advised of pt's complaints, as noted above.  Stated she will send a message through to another provider in the practice, as Merrie Roof, PA is unavailable.  Advised that the pt. may not get a call back until tomorrow, but should go to the ER if her symptoms worsen.  Pt. made aware of the above instructions.  Verb. Understanding; agrees with plan.

## 2017-07-13 NOTE — Telephone Encounter (Signed)
Patient was seen by Lori Davies on Friday for the following issues--Patient is still having issues with her bronchitis. Received a call from [facility]. They also are putting CRM in.  I explained that Lori Davies was gone for today but I would send the message through and it may be tomorrow morning before anyone returns the call.  Patient states she was not in any distress breathing but it was just tight. She states the augmentin was giving her indigestion and she also would like to have Breo inhaler.    Thank Natalia Leatherwood [phone]

## 2017-07-13 NOTE — Telephone Encounter (Signed)
Routing to [place], since all other providers are gone.

## 2017-09-20 ENCOUNTER — Ambulatory Visit: Payer: Self-pay | Admitting: *Deleted

## 2017-09-20 NOTE — Telephone Encounter (Signed)
Pt has cold symptoms now with laryngitis. Afraid that it is going to turn in bronchitis. Having some chills and sweating.  Home care advice given to patient.  Will go to [facility] on [address] to be assessed.   Reason for Disposition . [1] Nasal discharge AND [2] present > 10 days  Answer Assessment - Initial Assessment Questions 1. ONSET: "When did the nasal discharge start?"      A couple days 2. AMOUNT: "How much discharge is there?"      Not a lot 3. COUGH: "Do you have a cough?" If yes, ask: "Describe the color of your sputum" (clear, white, yellow, green)     Yes, coughing. 4. RESPIRATORY DISTRESS: "Describe your breathing."      Some chest tightness 5. FEVER: "Do you have a fever?" If so, ask: "What is your temperature, how was it measured, and when did it start?"     Having some chills and sweating 6. SEVERITY: "Overall, how bad are you feeling right now?" (e.g., doesn't interfere with normal activities, staying home from school/work, staying in bed)      Feeling about an 8 right now. Laying around 7. OTHER SYMPTOMS: "Do you have any other symptoms?" (e.g., sore throat, earache, wheezing, vomiting)     Itching ears, feeling full. Wheezing  8. PREGNANCY: "Is there any chance you are pregnant?" "When was your last menstrual period?"     no  Protocols used: COMMON COLD-A-AH

## 2017-11-01 ENCOUNTER — Encounter: Payer: Self-pay | Admitting: Family Medicine

## 2017-11-01 ENCOUNTER — Ambulatory Visit: Payer: BLUE CROSS/BLUE SHIELD | Admitting: Family Medicine

## 2017-11-01 VITALS — BP 90/61 | HR 77 | Temp 98.3°F | Wt 119.0 lb

## 2017-11-01 DIAGNOSIS — H66002 Acute suppurative otitis media without spontaneous rupture of ear drum, left ear: Secondary | ICD-10-CM

## 2017-11-01 MED ORDER — AMOXICILLIN-POT CLAVULANATE 875-125 MG PO TABS: 1.0000 | ORAL_TABLET | Freq: Two times a day (BID) | ORAL | 0 refills | Status: DC

## 2017-11-01 NOTE — Progress Notes (Signed)
BP 90/61 (BP Location: Left Arm, Patient Position: Sitting, Cuff Size: Normal)   Pulse 77   Temp 98.3 F (36.8 C) (Oral)   Wt 119 lb (54 kg)   SpO2 100%   BMI 18.92 kg/m    Subjective:    Patient ID: Lori Davies, female    DOB: 1970/11/24, 47 y.o.   MRN: [id number]  HPI: Lori Davies is a 47 y.o. female  Chief Complaint  Patient presents with  . Nasal Congestion   UPPER RESPIRATORY TRACT INFECTION Duration: 5 days Worst symptom: congestion Fever: no Cough: yes Shortness of breath: no Wheezing: yes Chest pain: no Chest tightness: yes Chest congestion: yes Nasal congestion: yes Runny nose: yes Post nasal drip: yes Sneezing: yes Sore throat: no Swollen glands: no Sinus pressure: no Headache: yes Face pain: no Toothache: no Ear pain: yes bilateral Ear pressure: yes bilateral Eyes red/itching:yes Eye drainage/crusting: no  Vomiting: no Rash: no Fatigue: yes Sick contacts: yes Strep contacts: no  Context: worse Recurrent sinusitis: yes Relief with OTC cold/cough medications: no  Treatments attempted: cold/sinus, mucinex, anti-histamine and pseudoephedrine   Relevant past medical, surgical, family and social history reviewed and updated as indicated. Interim medical history since our last visit reviewed. Allergies and medications reviewed and updated.  Review of Systems  Constitutional: Negative.   HENT: Positive for congestion, ear pain, postnasal drip, rhinorrhea, sinus pressure, sinus pain, sneezing and sore throat. Negative for dental problem, drooling, ear discharge, facial swelling, hearing loss, mouth sores, nosebleeds, tinnitus, trouble swallowing and voice change.   Respiratory: Positive for cough, chest tightness, shortness of breath and wheezing. Negative for apnea, choking and stridor.   Cardiovascular: Negative.   Psychiatric/Behavioral: Negative.     Per HPI unless specifically indicated above     Objective:    BP 90/61 (BP Location:  Left Arm, Patient Position: Sitting, Cuff Size: Normal)   Pulse 77   Temp 98.3 F (36.8 C) (Oral)   Wt 119 lb (54 kg)   SpO2 100%   BMI 18.92 kg/m   Wt Readings from Last 3 Encounters:  11/01/17 119 lb (54 kg)  07/09/17 120 lb (54.4 kg)  02/11/17 122 lb 1.6 oz (55.4 kg)    Physical Exam  Constitutional: She is oriented to person, place, and time. She appears well-developed and well-nourished. No distress.  HENT:  Head: Normocephalic and atraumatic.  Right Ear: Hearing, tympanic membrane, external ear and ear canal normal.  Left Ear: Hearing, external ear and ear canal normal. Tympanic membrane is injected and bulging.  Nose: Nose normal.  Mouth/Throat: Uvula is midline, oropharynx is clear and moist and mucous membranes are normal. No oropharyngeal exudate.  Eyes: Conjunctivae, EOM and lids are normal. Pupils are equal, round, and reactive to light. Right eye exhibits no discharge. Left eye exhibits no discharge. No scleral icterus.  Neck: Normal range of motion. Neck supple. No JVD present. No tracheal deviation present. No thyromegaly present.  Cardiovascular: Normal rate, regular rhythm, normal heart sounds and intact distal pulses. Exam reveals no gallop and no friction rub.  No murmur heard. Pulmonary/Chest: Effort normal and breath sounds normal. No stridor. No respiratory distress. She has no wheezes. She has no rales. She exhibits no tenderness.  Musculoskeletal: Normal range of motion.  Lymphadenopathy:    She has no cervical adenopathy.  Neurological: She is alert and oriented to person, place, and time.  Skin: Skin is intact. No rash noted. She is not diaphoretic.  Psychiatric: She has a  normal mood and affect. Her speech is normal and behavior is normal. Judgment and thought content normal. Cognition and memory are normal.  Nursing note and vitals reviewed.   Results for orders placed or performed in visit on 02/11/17  Microscopic Examination  Result Value Ref Range     WBC, UA 0-5 0 - 5 /hpf   RBC, UA None seen 0 - 2 /hpf   Epithelial Cells (non renal) 0-10 0 - 10 /hpf   Renal Epithel, UA 0-10 (A) None seen /hpf   Bacteria, UA None seen None seen/Few  CBC with Differential/Platelet  Result Value Ref Range   WBC 7.2 3.4 - 10.8 x10E3/uL   RBC 5.20 3.77 - 5.28 x10E6/uL   Hemoglobin 14.7 11.1 - 15.9 g/dL   Hematocrit 44.3 34.0 - 46.6 %   MCV 85 79 - 97 fL   MCH 28.3 26.6 - 33.0 pg   MCHC 33.2 31.5 - 35.7 g/dL   RDW 13.7 12.3 - 15.4 %   Platelets 334 150 - 379 x10E3/uL   Neutrophils 59 Not Estab. %   Lymphs 30 Not Estab. %   Monocytes 6 Not Estab. %   Eos 4 Not Estab. %   Basos 1 Not Estab. %   Neutrophils Absolute 4.3 1.4 - 7.0 x10E3/uL   Lymphocytes Absolute 2.1 0.7 - 3.1 x10E3/uL   Monocytes Absolute 0.4 0.1 - 0.9 x10E3/uL   EOS (ABSOLUTE) 0.3 0.0 - 0.4 x10E3/uL   Basophils Absolute 0.1 0.0 - 0.2 x10E3/uL   Immature Granulocytes 0 Not Estab. %   Immature Grans (Abs) 0.0 0.0 - 0.1 x10E3/uL  Comprehensive metabolic panel  Result Value Ref Range   Glucose 100 (H) 65 - 99 mg/dL   BUN 11 6 - 24 mg/dL   Creatinine, Ser 0.85 0.57 - 1.00 mg/dL   GFR calc non Af Amer 82 >59 mL/min/1.73   GFR calc Af Amer 94 >59 mL/min/1.73   BUN/Creatinine Ratio 13 9 - 23   Sodium 143 134 - 144 mmol/L   Potassium 4.7 3.5 - 5.2 mmol/L   Chloride 103 96 - 106 mmol/L   CO2 25 18 - 29 mmol/L   Calcium 10.1 8.7 - 10.2 mg/dL   Total Protein 7.2 6.0 - 8.5 g/dL   Albumin 4.6 3.5 - 5.5 g/dL   Globulin, Total 2.6 1.5 - 4.5 g/dL   Albumin/Globulin Ratio 1.8 1.2 - 2.2   Bilirubin Total 0.2 0.0 - 1.2 mg/dL   Alkaline Phosphatase 89 39 - 117 IU/L   AST 14 0 - 40 IU/L   ALT 9 0 - 32 IU/L  Lipid Panel w/o Chol/HDL Ratio  Result Value Ref Range   Cholesterol, Total 157 100 - 199 mg/dL   Triglycerides 177 (H) 0 - 149 mg/dL   HDL 54 >39 mg/dL   VLDL Cholesterol Cal 35 5 - 40 mg/dL   LDL Calculated 68 0 - 99 mg/dL  TSH  Result Value Ref Range   TSH 1.460 0.450 - 4.500  uIU/mL  UA/M w/rflx Culture, Routine  Result Value Ref Range   Specific Gravity, UA 1.015 1.005 - 1.030   pH, UA 7.0 5.0 - 7.5   Color, UA Yellow Yellow   Appearance Ur Cloudy (A) Clear   Leukocytes, UA 1+ (A) Negative   Protein, UA Trace (A) Negative/Trace   Glucose, UA Negative Negative   Ketones, UA Negative Negative   RBC, UA Negative Negative   Bilirubin, UA Negative Negative   Urobilinogen, Ur 0.2  0.2 - 1.0 mg/dL   Nitrite, UA Negative Negative   Microscopic Examination See below:    Urinalysis Reflex Comment   Urine Culture, Routine  Result Value Ref Range   Urine Culture, Routine Final report    Organism ID, Bacteria No growth   Follicle stimulating hormone  Result Value Ref Range   FSH 156.4 mIU/mL  Luteinizing hormone  Result Value Ref Range   LH 63.3 mIU/mL  Estradiol  Result Value Ref Range   Estradiol <5.0 pg/mL  Specimen status report  Result Value Ref Range   specimen status report Comment       Assessment & Plan:   Problem List Items Addressed This Visit    None    Visit Diagnoses    Acute suppurative otitis media of left ear without spontaneous rupture of tympanic membrane, recurrence not specified    -  Primary   Will treat with augmentin. Call with any concerns.    Relevant Medications   amoxicillin-clavulanate (AUGMENTIN) 875-125 MG tablet       Follow up plan: Return if symptoms worsen or fail to improve.

## 2017-11-03 ENCOUNTER — Encounter: Payer: Self-pay | Admitting: Family Medicine

## 2017-11-03 MED ORDER — AMOXICILLIN 875 MG PO TABS: 875.0000 mg | ORAL_TABLET | Freq: Two times a day (BID) | ORAL | 0 refills | Status: DC

## 2018-03-09 ENCOUNTER — Encounter: Payer: Self-pay | Admitting: Family Medicine

## 2018-03-10 ENCOUNTER — Ambulatory Visit: Payer: BLUE CROSS/BLUE SHIELD | Admitting: Family Medicine

## 2018-03-10 ENCOUNTER — Telehealth: Payer: Self-pay | Admitting: Family Medicine

## 2018-03-10 NOTE — Telephone Encounter (Signed)
Attempted to reach patient. No answer. VM left. Please advise.

## 2018-03-10 NOTE — Telephone Encounter (Signed)
Please advise patient to go to urgent care given chest pains.

## 2018-03-14 ENCOUNTER — Encounter: Payer: Self-pay | Admitting: Family Medicine

## 2018-03-14 MED ORDER — FLUTICASONE FUROATE-VILANTEROL 100-25 MCG/INH IN AEPB: 1.0000 | INHALATION_SPRAY | Freq: Every day | RESPIRATORY_TRACT | 2 refills | Status: DC

## 2018-03-14 NOTE — Telephone Encounter (Signed)
Can we check on her please?

## 2018-03-14 NOTE — Telephone Encounter (Signed)
Patient states that it seems to be better, she was given amoxicillin and prednisone, she said that the prednisone makes her feel as if he throat is closing up, is there something different than the prednisone, she was diagnosed with a URI  Pink

## 2018-03-16 ENCOUNTER — Encounter: Payer: Self-pay | Admitting: Family Medicine

## 2018-03-16 NOTE — Telephone Encounter (Signed)
Message relayed to patient. Verbalized understanding and denied questions. Pt stated she would go to urgent care. Then d/c line before any other conversation could be had.

## 2018-03-17 ENCOUNTER — Other Ambulatory Visit: Payer: Self-pay | Admitting: Family Medicine

## 2018-03-17 ENCOUNTER — Ambulatory Visit: Payer: BLUE CROSS/BLUE SHIELD | Admitting: Family Medicine

## 2018-03-17 ENCOUNTER — Ambulatory Visit: Payer: BLUE CROSS/BLUE SHIELD | Admitting: Physician Assistant

## 2018-03-22 ENCOUNTER — Encounter: Payer: Self-pay | Admitting: Family Medicine

## 2018-03-22 ENCOUNTER — Other Ambulatory Visit: Payer: Self-pay | Admitting: Family Medicine

## 2018-03-22 MED ORDER — UMECLIDINIUM-VILANTEROL 62.5-25 MCG/INH IN AEPB: 1.0000 | INHALATION_SPRAY | Freq: Every day | RESPIRATORY_TRACT | 0 refills | Status: DC

## 2018-03-28 ENCOUNTER — Other Ambulatory Visit: Payer: Self-pay

## 2018-03-28 ENCOUNTER — Ambulatory Visit (INDEPENDENT_AMBULATORY_CARE_PROVIDER_SITE_OTHER): Payer: BLUE CROSS/BLUE SHIELD | Admitting: Family Medicine

## 2018-03-28 ENCOUNTER — Encounter: Payer: Self-pay | Admitting: Family Medicine

## 2018-03-28 VITALS — BP 92/59 | HR 80 | Temp 97.9°F | Ht 66.0 in | Wt 118.4 lb

## 2018-03-28 DIAGNOSIS — Z0000 Encounter for general adult medical examination without abnormal findings: Secondary | ICD-10-CM

## 2018-03-28 DIAGNOSIS — R8290 Unspecified abnormal findings in urine: Secondary | ICD-10-CM

## 2018-03-28 DIAGNOSIS — Z0001 Encounter for general adult medical examination with abnormal findings: Secondary | ICD-10-CM

## 2018-03-28 DIAGNOSIS — F330 Major depressive disorder, recurrent, mild: Secondary | ICD-10-CM

## 2018-03-28 DIAGNOSIS — J40 Bronchitis, not specified as acute or chronic: Secondary | ICD-10-CM

## 2018-03-28 DIAGNOSIS — J301 Allergic rhinitis due to pollen: Secondary | ICD-10-CM

## 2018-03-28 LAB — MICROSCOPIC EXAMINATION

## 2018-03-28 LAB — UA/M W/RFLX CULTURE, ROUTINE
Bilirubin, UA: NEGATIVE
Glucose, UA: NEGATIVE
KETONES UA: NEGATIVE
NITRITE UA: NEGATIVE
SPEC GRAV UA: 1.025 (ref 1.005–1.030)
UUROB: 0.2 mg/dL (ref 0.2–1.0)
pH, UA: 6 (ref 5.0–7.5)

## 2018-03-28 MED ORDER — SERTRALINE HCL 50 MG PO TABS: 50.0000 mg | ORAL_TABLET | Freq: Every day | ORAL | 3 refills | Status: DC

## 2018-03-28 NOTE — Progress Notes (Signed)
BP (!) 92/59 (BP Location: Right Arm, Patient Position: Sitting, Cuff Size: Normal)   Pulse 80   Temp 97.9 F (36.6 C) (Oral)   Ht 5\' 6"  (1.676 m)   Wt 118 lb 6.4 oz (53.7 kg)   SpO2 99%   BMI 19.11 kg/m    Subjective:    Patient ID: Lori Davies, female    DOB: 1971/06/28, 47 y.o.   MRN: [id number]  HPI: Lori Davies is a 47 y.o. female presenting on 03/28/2018 for comprehensive medical examination. Current medical complaints include:see below  Currently taking prednisone and doxycycline from UC for bronchitis. Sxs improving. No fevers, SOB, wheezes, CP.   Had stopped her celexa almost a year ago due to fatigue side effects, feels she needs to be back on something for her moods. States she did the best on zoloft in the past. No SI/HI, severe mood swings, sleep issues.   Depression Screen done today and results listed below:  Depression screen [facility] 2/9 03/28/2018 02/11/2017 10/30/2015  Decreased Interest 0 0 0  Down, Depressed, Hopeless 0 0 0  PHQ - 2 Score 0 0 0  Altered sleeping 0 0 1  Tired, decreased energy 3 1 1   Change in appetite 1 0 0  Feeling bad or failure about yourself  0 0 0  Trouble concentrating 0 0 0  Moving slowly or fidgety/restless 0 0 0  Suicidal thoughts 0 0 0  PHQ-9 Score 4 1 2   Difficult doing work/chores Somewhat difficult - Not difficult at all    The patient does not have a history of falls. I did not complete a risk assessment for falls. A plan of care for falls was not documented.   Past Medical History:  No past medical history on file.  Surgical History:  No past surgical history on file.  Medications:  Current Outpatient Medications on File Prior to Visit  Medication Sig  . albuterol (PROVENTIL HFA;VENTOLIN HFA) 108 (90 Base) MCG/ACT inhaler Inhale 2 puffs into the lungs every 4 (four) hours as needed for wheezing or shortness of breath.  . fexofenadine (ALLEGRA ALLERGY) 180 MG tablet Take 1 tablet (180 mg total) by mouth daily.  .  montelukast (SINGULAIR) 10 MG tablet TAKE 1 TABLET BY MOUTH EVERYDAY AT BEDTIME  . umeclidinium-vilanterol (ANORO ELLIPTA) 62.5-25 MCG/INH AEPB Inhale 1 puff into the lungs daily.  . fluticasone furoate-vilanterol (BREO ELLIPTA) 100-25 MCG/INH AEPB Inhale 1 puff into the lungs daily. (Patient not taking: Reported on 03/28/2018)   No current facility-administered medications on file prior to visit.     Allergies:  Allergies  Allergen Reactions  . Clavulanic Acid Nausea And Vomiting  . Doxycycline Nausea And Vomiting  . Fish Oil Hives  . Sulfa Antibiotics     Social History:  Social History   Socioeconomic History  . Marital status: Married    Spouse name: Not on file  . Number of children: Not on file  . Years of education: Not on file  . Highest education level: Not on file  Occupational History  . Not on file  Social Needs  . Financial resource strain: Not on file  . Food insecurity:    Worry: Not on file    Inability: Not on file  . Transportation needs:    Medical: Not on file    Non-medical: Not on file  Tobacco Use  . Smoking status: Former Smoker    Types: Cigarettes    Last attempt to quit: 06/04/2014  Years since quitting: 3.8  . Smokeless tobacco: Never Used  Substance and Sexual Activity  . Alcohol use: No    Alcohol/week: 0.0 oz  . Drug use: No  . Sexual activity: Yes    Birth control/protection: Injection  Lifestyle  . Physical activity:    Days per week: Not on file    Minutes per session: Not on file  . Stress: Not on file  Relationships  . Social connections:    Talks on phone: Not on file    Gets together: Not on file    Attends religious service: Not on file    Active member of club or organization: Not on file    Attends meetings of clubs or organizations: Not on file    Relationship status: Not on file  . Intimate partner violence:    Fear of current or ex partner: Not on file    Emotionally abused: Not on file    Physically abused: Not  on file    Forced sexual activity: Not on file  Other Topics Concern  . Not on file  Social History Narrative  . Not on file   Social History   Tobacco Use  Smoking Status Former Smoker  . Types: Cigarettes  . Last attempt to quit: 06/04/2014  . Years since quitting: 3.8  Smokeless Tobacco Never Used   Social History   Substance and Sexual Activity  Alcohol Use No  . Alcohol/week: 0.0 oz    Family History:  Family History  Problem Relation Age of Onset  . Heart disease Mother   . Lung disease Mother   . Heart disease Father   . Hypertension Father   . Autism Son     Past medical history, surgical history, medications, allergies, family history and social history reviewed with patient today and changes made to appropriate areas of the chart.   Review of Systems - General ROS: negative Psychological ROS: positive for - depression Ophthalmic ROS: negative ENT ROS: negative Endocrine ROS: negative Breast ROS: negative for breast lumps Respiratory ROS: no cough, shortness of breath, or wheezing Cardiovascular ROS: no chest pain or dyspnea on exertion Gastrointestinal ROS: no abdominal pain, change in bowel habits, or black or bloody stools Genito-Urinary ROS: no dysuria, trouble voiding, or hematuria Musculoskeletal ROS: negative Neurological ROS: no TIA or stroke symptoms Dermatological ROS: negative All other ROS negative except what is listed above and in the HPI.      Objective:    BP (!) 92/59 (BP Location: Right Arm, Patient Position: Sitting, Cuff Size: Normal)   Pulse 80   Temp 97.9 F (36.6 C) (Oral)   Ht 5\' 6"  (1.676 m)   Wt 118 lb 6.4 oz (53.7 kg)   SpO2 99%   BMI 19.11 kg/m   Wt Readings from Last 3 Encounters:  03/28/18 118 lb 6.4 oz (53.7 kg)  11/01/17 119 lb (54 kg)  07/09/17 120 lb (54.4 kg)    Physical Exam  Constitutional: She is oriented to person, place, and time. She appears well-developed and well-nourished. No distress.  HENT:    Head: Atraumatic.  Right Ear: External ear normal.  Left Ear: External ear normal.  Nose: Nose normal.  Mouth/Throat: Oropharynx is clear and moist. No oropharyngeal exudate.  Eyes: Pupils are equal, round, and reactive to light. Conjunctivae are normal. No scleral icterus.  Neck: Normal range of motion. Neck supple. No thyromegaly present.  Cardiovascular: Normal rate, regular rhythm, normal heart sounds and intact distal pulses.  Pulmonary/Chest: Effort normal and breath sounds normal. No respiratory distress. Right breast exhibits no mass, no skin change and no tenderness. Left breast exhibits no mass, no skin change and no tenderness.  Abdominal: Soft. Bowel sounds are normal. She exhibits no mass. There is no tenderness.  Musculoskeletal: Normal range of motion. She exhibits no edema or tenderness.  Lymphadenopathy:    She has no cervical adenopathy.    She has no axillary adenopathy.  Neurological: She is alert and oriented to person, place, and time. No cranial nerve deficit.  Skin: Skin is warm and dry. No rash noted.  Psychiatric: She has a normal mood and affect. Her behavior is normal.  Nursing note and vitals reviewed.   Results for orders placed or performed in visit on 03/28/18  Microscopic Examination  Result Value Ref Range   WBC, UA 11-30 (A) 0 - 5 /hpf   RBC, UA 0-2 0 - 2 /hpf   Epithelial Cells (non renal) 0-10 0 - 10 /hpf   Renal Epithel, UA 0-10 (A) None seen /hpf   Mucus, UA Present Not Estab.   Bacteria, UA Few None seen/Few  CBC with Differential/Platelet  Result Value Ref Range   WBC 11.0 (H) 3.4 - 10.8 x10E3/uL   RBC 5.09 3.77 - 5.28 x10E6/uL   Hemoglobin 14.8 11.1 - 15.9 g/dL   Hematocrit 44.8 34.0 - 46.6 %   MCV 88 79 - 97 fL   MCH 29.1 26.6 - 33.0 pg   MCHC 33.0 31.5 - 35.7 g/dL   RDW 13.6 12.3 - 15.4 %   Platelets 334 150 - 450 x10E3/uL   Neutrophils 61 Not Estab. %   Lymphs 34 Not Estab. %   Monocytes 4 Not Estab. %   Eos 1 Not Estab. %    Basos 0 Not Estab. %   Neutrophils Absolute 6.7 1.4 - 7.0 x10E3/uL   Lymphocytes Absolute 3.8 (H) 0.7 - 3.1 x10E3/uL   Monocytes Absolute 0.5 0.1 - 0.9 x10E3/uL   EOS (ABSOLUTE) 0.1 0.0 - 0.4 x10E3/uL   Basophils Absolute 0.0 0.0 - 0.2 x10E3/uL   Immature Granulocytes 0 Not Estab. %   Immature Grans (Abs) 0.0 0.0 - 0.1 x10E3/uL  Comprehensive metabolic panel  Result Value Ref Range   Glucose 107 (H) 65 - 99 mg/dL   BUN 20 6 - 24 mg/dL   Creatinine, Ser 0.92 0.57 - 1.00 mg/dL   GFR calc non Af Amer 74 >59 mL/min/1.73   GFR calc Af Amer 85 >59 mL/min/1.73   BUN/Creatinine Ratio 22 9 - 23   Sodium 140 134 - 144 mmol/L   Potassium 3.9 3.5 - 5.2 mmol/L   Chloride 100 96 - 106 mmol/L   CO2 23 20 - 29 mmol/L   Calcium 10.0 8.7 - 10.2 mg/dL   Total Protein 6.7 6.0 - 8.5 g/dL   Albumin 4.3 3.5 - 5.5 g/dL   Globulin, Total 2.4 1.5 - 4.5 g/dL   Albumin/Globulin Ratio 1.8 1.2 - 2.2   Bilirubin Total 0.4 0.0 - 1.2 mg/dL   Alkaline Phosphatase 84 39 - 117 IU/L   AST 13 0 - 40 IU/L   ALT 11 0 - 32 IU/L  Lipid Panel w/o Chol/HDL Ratio  Result Value Ref Range   Cholesterol, Total 168 100 - 199 mg/dL   Triglycerides 93 0 - 149 mg/dL   HDL 79 >39 mg/dL   VLDL Cholesterol Cal 19 5 - 40 mg/dL   LDL Calculated 70 0 - 99  mg/dL  TSH  Result Value Ref Range   TSH 2.670 0.450 - 4.500 uIU/mL  UA/M w/rflx Culture, Routine  Result Value Ref Range   Specific Gravity, UA 1.025 1.005 - 1.030   pH, UA 6.0 5.0 - 7.5   Color, UA Yellow Yellow   Appearance Ur Cloudy (A) Clear   Leukocytes, UA 2+ (A) Negative   Protein, UA 1+ (A) Negative/Trace   Glucose, UA Negative Negative   Ketones, UA Negative Negative   RBC, UA Trace (A) Negative   Bilirubin, UA Negative Negative   Urobilinogen, Ur 0.2 0.2 - 1.0 mg/dL   Nitrite, UA Negative Negative   Microscopic Examination See below:       Assessment & Plan:   Problem List Items Addressed This Visit      Respiratory   Allergic rhinitis    Stable,  continue current regimen        Other   Major depression, recurrent ([place]) - Primary    Restart zoloft and monitor for benefit      Relevant Medications   sertraline (ZOLOFT) 50 MG tablet    Other Visit Diagnoses    Annual physical exam       Relevant Orders   CBC with Differential/Platelet (Completed)   Comprehensive metabolic panel (Completed)   Lipid Panel w/o Chol/HDL Ratio (Completed)   TSH (Completed)   UA/M w/rflx Culture, Routine (Completed)   Abnormal urinalysis       Incidental finding. Await urine culture   Relevant Orders   Urine Culture   Bronchitis       Improving on steroids and abx from UC. Continue current regimen, f/u if worsening or not improving. Albuterol inhaler prn       Follow up plan: Return in about 3 months (around 06/28/2018) for Anxiety f/u.   LABORATORY TESTING:  - Pap smear: refused  IMMUNIZATIONS:   - Tdap: Tetanus vaccination status reviewed: refused. - Influenza: Postponed to flu season  SCREENING: -Mammogram: Refused   PATIENT COUNSELING:   Advised to take 1 mg of folate supplement per day if capable of pregnancy.   Sexuality: Discussed sexually transmitted diseases, partner selection, use of condoms, avoidance of unintended pregnancy  and contraceptive alternatives.   Advised to avoid cigarette smoking.  I discussed with the patient that most people either abstain from alcohol or drink within safe limits (<=14/week and <=4 drinks/occasion for males, <=7/weeks and <= 3 drinks/occasion for females) and that the risk for alcohol disorders and other health effects rises proportionally with the number of drinks per week and how often a drinker exceeds daily limits.  Discussed cessation/primary prevention of drug use and availability of treatment for abuse.   Diet: Encouraged to adjust caloric intake to maintain  or achieve ideal body weight, to reduce intake of dietary saturated fat and total fat, to limit sodium intake by avoiding  high sodium foods and not adding table salt, and to maintain adequate dietary potassium and calcium preferably from fresh fruits, vegetables, and low-fat dairy products.    stressed the importance of regular exercise  Injury prevention: Discussed safety belts, safety helmets, smoke detector, smoking near bedding or upholstery.   Dental health: Discussed importance of regular tooth brushing, flossing, and dental visits.    NEXT PREVENTATIVE PHYSICAL DUE IN 1 YEAR. Return in about 3 months (around 06/28/2018) for Anxiety f/u.

## 2018-03-29 ENCOUNTER — Encounter: Payer: Self-pay | Admitting: Family Medicine

## 2018-03-29 LAB — CBC WITH DIFFERENTIAL/PLATELET
BASOS ABS: 0 10*3/uL (ref 0.0–0.2)
Basos: 0 %
EOS (ABSOLUTE): 0.1 10*3/uL (ref 0.0–0.4)
Eos: 1 %
Hematocrit: 44.8 % (ref 34.0–46.6)
Hemoglobin: 14.8 g/dL (ref 11.1–15.9)
IMMATURE GRANS (ABS): 0 10*3/uL (ref 0.0–0.1)
Immature Granulocytes: 0 %
LYMPHS: 34 %
Lymphocytes Absolute: 3.8 10*3/uL — ABNORMAL HIGH (ref 0.7–3.1)
MCH: 29.1 pg (ref 26.6–33.0)
MCHC: 33 g/dL (ref 31.5–35.7)
MCV: 88 fL (ref 79–97)
MONOS ABS: 0.5 10*3/uL (ref 0.1–0.9)
Monocytes: 4 %
Neutrophils Absolute: 6.7 10*3/uL (ref 1.4–7.0)
Neutrophils: 61 %
Platelets: 334 10*3/uL (ref 150–450)
RBC: 5.09 x10E6/uL (ref 3.77–5.28)
RDW: 13.6 % (ref 12.3–15.4)
WBC: 11 10*3/uL — ABNORMAL HIGH (ref 3.4–10.8)

## 2018-03-29 LAB — COMPREHENSIVE METABOLIC PANEL
A/G RATIO: 1.8 (ref 1.2–2.2)
ALBUMIN: 4.3 g/dL (ref 3.5–5.5)
ALT: 11 IU/L (ref 0–32)
AST: 13 IU/L (ref 0–40)
Alkaline Phosphatase: 84 IU/L (ref 39–117)
BILIRUBIN TOTAL: 0.4 mg/dL (ref 0.0–1.2)
BUN / CREAT RATIO: 22 (ref 9–23)
BUN: 20 mg/dL (ref 6–24)
CALCIUM: 10 mg/dL (ref 8.7–10.2)
CHLORIDE: 100 mmol/L (ref 96–106)
CO2: 23 mmol/L (ref 20–29)
Creatinine, Ser: 0.92 mg/dL (ref 0.57–1.00)
GFR, EST AFRICAN AMERICAN: 85 mL/min/{1.73_m2} (ref >59)
GFR, EST NON AFRICAN AMERICAN: 74 mL/min/{1.73_m2} (ref >59)
GLOBULIN, TOTAL: 2.4 g/dL (ref 1.5–4.5)
Glucose: 107 mg/dL — ABNORMAL HIGH (ref 65–99)
POTASSIUM: 3.9 mmol/L (ref 3.5–5.2)
Sodium: 140 mmol/L (ref 134–144)
TOTAL PROTEIN: 6.7 g/dL (ref 6.0–8.5)

## 2018-03-29 LAB — LIPID PANEL W/O CHOL/HDL RATIO
Cholesterol, Total: 168 mg/dL (ref 100–199)
HDL: 79 mg/dL (ref >39)
LDL Calculated: 70 mg/dL (ref 0–99)
Triglycerides: 93 mg/dL (ref 0–149)
VLDL Cholesterol Cal: 19 mg/dL (ref 5–40)

## 2018-03-29 LAB — TSH: TSH: 2.67 u[IU]/mL (ref 0.450–4.500)

## 2018-03-29 NOTE — Assessment & Plan Note (Signed)
Stable, continue current regimen 

## 2018-03-29 NOTE — Assessment & Plan Note (Signed)
Restart zoloft and monitor for benefit

## 2018-03-29 NOTE — Patient Instructions (Signed)
Follow up in 3 months

## 2018-03-30 ENCOUNTER — Encounter: Payer: Self-pay | Admitting: Family Medicine

## 2018-03-30 LAB — URINE CULTURE: Organism ID, Bacteria: NO GROWTH

## 2018-03-30 MED ORDER — AMOXICILLIN 875 MG PO TABS: 875.0000 mg | ORAL_TABLET | Freq: Two times a day (BID) | ORAL | 0 refills | Status: DC

## 2018-03-30 NOTE — Telephone Encounter (Signed)
Spoke with pt, having worsening ear pressure, facial pressure, congestion, dizziness since being off doxy. BP was 108/70 this morning and she's been drinking more water as advised. Will send in amoxil and continue hydration, pt aware to call back if worsening or no improvement. Also let her know her urine culture was neg.

## 2018-04-04 ENCOUNTER — Ambulatory Visit: Payer: Self-pay | Admitting: Family Medicine

## 2018-04-04 NOTE — Telephone Encounter (Signed)
Pt called c/o lightheadedness- likes she is floating on air when she walks and stated that she feels "out of it." Pt rated her lightheadedness moderate in severity. Dizziness began last Thursday. Pt does not know what is causing her dizziness. Pt stated that she has never had dizziness before. Pt also c/o fatigue.  Care advice given per protocol and she vrebalized understanding. Pt stated that she cannot see doctor until Wednesday am . Appt made for pt Wednesday with Merrie Roof PA (per pt request) at 0800.  Reason for Disposition . [1] MODERATE dizziness (e.g., interferes with normal activities) AND [2] has NOT been evaluated by physician for this  (Exception: dizziness caused by heat exposure, sudden standing, or poor fluid intake)  Answer Assessment - Initial Assessment Questions 1. DESCRIPTION: "Describe your dizziness."     Lightheaded like she is flaoting on air when she walks- feels "out of it" 2. LIGHTHEADED: "Do you feel lightheaded?" (e.g., somewhat faint, woozy, weak upon standing)     yes 3. VERTIGO: "Do you feel like either you or the room is spinning or tilting?" (i.e. vertigo)     no 4. SEVERITY: "How bad is it?"  "Do you feel like you are going to faint?" "Can you stand and walk?"   - MILD - walking normally   - MODERATE - interferes with normal activities (e.g., work, school)    - SEVERE - unable to stand, requires support to walk, feels like passing out now.      moderate 5. ONSET:  "When did the dizziness begin?"     Last Thursday 6. AGGRAVATING FACTORS: "Does anything make it worse?" (e.g., standing, change in head position)     no 7. HEART RATE: "Can you tell me your heart rate?" "How many beats in 15 seconds?"  (Note: not all patients can do this)       Pt does not know how to do this 8. CAUSE: "What do you think is causing the dizziness?"     Pt does not know 9. RECURRENT SYMPTOM: "Have you had dizziness before?" If so, ask: "When was the last time?" "What happened  that time?"     No  10. OTHER SYMPTOMS: "Do you have any other symptoms?" (e.g., fever, chest pain, vomiting, diarrhea, bleeding)       fatigue 11. PREGNANCY: "Is there any chance you are pregnant?" "When was your last menstrual period?"   No- LMP 5 years ago  Protocols used: [place]

## 2018-04-06 ENCOUNTER — Encounter: Payer: Self-pay | Admitting: Family Medicine

## 2018-04-06 ENCOUNTER — Ambulatory Visit: Payer: BLUE CROSS/BLUE SHIELD | Admitting: Family Medicine

## 2018-04-06 VITALS — BP 94/62 | HR 75 | Temp 97.6°F | Ht 67.0 in | Wt 118.1 lb

## 2018-04-06 DIAGNOSIS — N390 Urinary tract infection, site not specified: Secondary | ICD-10-CM

## 2018-04-06 DIAGNOSIS — F331 Major depressive disorder, recurrent, moderate: Secondary | ICD-10-CM | POA: Diagnosis not present

## 2018-04-06 DIAGNOSIS — R42 Dizziness and giddiness: Secondary | ICD-10-CM

## 2018-04-06 LAB — UA/M W/RFLX CULTURE, ROUTINE
Bilirubin, UA: NEGATIVE
GLUCOSE, UA: NEGATIVE
Ketones, UA: NEGATIVE
NITRITE UA: NEGATIVE
PROTEIN UA: NEGATIVE
SPEC GRAV UA: 1.015 (ref 1.005–1.030)
Urobilinogen, Ur: 0.2 mg/dL (ref 0.2–1.0)
pH, UA: 7 (ref 5.0–7.5)

## 2018-04-06 LAB — MICROSCOPIC EXAMINATION

## 2018-04-06 MED ORDER — BUPROPION HCL ER (XL) 150 MG PO TB24
150.0000 mg | ORAL_TABLET | Freq: Every day | ORAL | 2 refills | Status: DC
Start: 2018-04-06 — End: 2018-04-28

## 2018-04-06 MED ORDER — SULFAMETHOXAZOLE-TRIMETHOPRIM 800-160 MG PO TABS: 1.0000 | ORAL_TABLET | Freq: Two times a day (BID) | ORAL | 0 refills | Status: DC

## 2018-04-06 NOTE — Progress Notes (Signed)
BP 94/62 (BP Location: Right Arm, Patient Position: Sitting, Cuff Size: Small)   Pulse 75   Temp 97.6 F (36.4 C) (Oral)   Ht 5\' 7"  (1.702 m)   Wt 118 lb 1.6 oz (53.6 kg)   LMP  (LMP Unknown)   SpO2 99%   BMI 18.50 kg/m    Subjective:    Patient ID: Lori Davies, female    DOB: [birth year], 47 y.o.   MRN: [id number]  HPI: Lori Davies is a 47 y.o. female  Chief Complaint  Patient presents with  . Dizziness    Patient states she started feeling extreme dizziness and fatigue. Ongoing for 1 week. Patient states at times she'd feel like she was going to pass out.    Pt here with continued lightheadedness, mild SOB, and fatigue. States she intermittently feels like she's going to pass out. This has been ongoing for several months. Was worked up for sxs in the UC and ER last month with normal EKG, CXR, and bedside cardiac u/s. Has had several rounds of abx recently for bronchitis and UTIs with some relief.  Recently restarted her zoloft as her moods have worsened the past few months. Cutting her zoloft in half in case that was causing her fatigue but states this was going on before then. Does feel like it's helping some. Daughter feels her fatigue is related to her depression. Denies SI/HI.   Has had some right low back aching recently, wanting her urine rechecked to make sure recent UTI is gone.   No past medical history on file.  Social History   Socioeconomic History  . Marital status: Married    Spouse name: Not on file  . Number of children: Not on file  . Years of education: Not on file  . Highest education level: Not on file  Occupational History  . Not on file  Social Needs  . Financial resource strain: Not on file  . Food insecurity:    Worry: Not on file    Inability: Not on file  . Transportation needs:    Medical: Not on file    Non-medical: Not on file  Tobacco Use  . Smoking status: Former Smoker    Types: Cigarettes    Last attempt to quit: 06/04/2014      Years since quitting: 3.8  . Smokeless tobacco: Never Used  Substance and Sexual Activity  . Alcohol use: No    Alcohol/week: 0.0 oz  . Drug use: No  . Sexual activity: Yes    Birth control/protection: Injection  Lifestyle  . Physical activity:    Days per week: Not on file    Minutes per session: Not on file  . Stress: Not on file  Relationships  . Social connections:    Talks on phone: Not on file    Gets together: Not on file    Attends religious service: Not on file    Active member of club or organization: Not on file    Attends meetings of clubs or organizations: Not on file    Relationship status: Not on file  . Intimate partner violence:    Fear of current or ex partner: Not on file    Emotionally abused: Not on file    Physically abused: Not on file    Forced sexual activity: Not on file  Other Topics Concern  . Not on file  Social History Narrative  . Not on file    Relevant past medical,  surgical, family and social history reviewed and updated as indicated. Interim medical history since our last visit reviewed. Allergies and medications reviewed and updated.  Review of Systems  Per HPI unless specifically indicated above     Objective:    BP 94/62 (BP Location: Right Arm, Patient Position: Sitting, Cuff Size: Small)   Pulse 75   Temp 97.6 F (36.4 C) (Oral)   Ht 5\' 7"  (1.702 m)   Wt 118 lb 1.6 oz (53.6 kg)   LMP  (LMP Unknown)   SpO2 99%   BMI 18.50 kg/m   Wt Readings from Last 3 Encounters:  04/06/18 118 lb 1.6 oz (53.6 kg)  03/28/18 118 lb 6.4 oz (53.7 kg)  11/01/17 119 lb (54 kg)    Physical Exam  Constitutional: She is oriented to person, place, and time. She appears well-developed and well-nourished. No distress.  HENT:  Head: Atraumatic.  Right Ear: External ear normal.  Left Ear: External ear normal.  Mouth/Throat: Oropharynx is clear and moist.  Eyes: Pupils are equal, round, and reactive to light. Conjunctivae and EOM are normal.   Neck: Normal range of motion. Neck supple.  Cardiovascular: Normal rate, regular rhythm and normal heart sounds.  Pulmonary/Chest: Effort normal and breath sounds normal.  Abdominal: Soft. Bowel sounds are normal. There is no tenderness.  Musculoskeletal: Normal range of motion. She exhibits no tenderness (No CVA tenderness b/l).  Lymphadenopathy:    She has no cervical adenopathy.  Neurological: She is alert and oriented to person, place, and time. Coordination normal.  Skin: Skin is warm and dry.  Psychiatric: She has a normal mood and affect. Her behavior is normal.  Nursing note and vitals reviewed.   Results for orders placed or performed in visit on 04/06/18  Urine Culture  Result Value Ref Range   Urine Culture, Routine Final report    Organism ID, Bacteria No growth   Microscopic Examination  Result Value Ref Range   WBC, UA 0-5 0 - 5 /hpf   RBC, UA 0-2 0 - 2 /hpf   Epithelial Cells (non renal) 0-10 0 - 10 /hpf   Renal Epithel, UA 0-10 (A) None seen /hpf   Bacteria, UA Few None seen/Few  UA/M w/rflx Culture, Routine  Result Value Ref Range   Specific Gravity, UA 1.015 1.005 - 1.030   pH, UA 7.0 5.0 - 7.5   Color, UA Yellow Yellow   Appearance Ur Clear Clear   Leukocytes, UA 1+ (A) Negative   Protein, UA Negative Negative/Trace   Glucose, UA Negative Negative   Ketones, UA Negative Negative   RBC, UA Trace (A) Negative   Bilirubin, UA Negative Negative   Urobilinogen, Ur 0.2 0.2 - 1.0 mg/dL   Nitrite, UA Negative Negative   Microscopic Examination See below:       Assessment & Plan:   Problem List Items Addressed This Visit      Other   Major depression, recurrent ([place])    Will d/c zoloft and start wellbutrin, monitor closely for improvement in fatigue sxs. Risks and benefits reviewed      Relevant Medications   buPROPion (WELLBUTRIN XL) 150 MG 24 hr tablet    Other Visit Diagnoses    Acute lower UTI    -  Primary   Tx with bactrim, probiotics, push  fluids. Await cx.    Relevant Medications   sulfamethoxazole-trimethoprim (BACTRIM DS,SEPTRA DS) 800-160 MG tablet   Other Relevant Orders   UA/M w/rflx Culture, Routine (Completed)  Urine Culture (Completed)   Lightheadedness       Pt declining new EKG or other cardiac workup today. BP soft but in normal range for pt. Push fluids, rest, balanced diet. Return precautions reviewed       Follow up plan: Return in about 1 month (around 05/04/2018) for Mood f/u.

## 2018-04-08 ENCOUNTER — Encounter: Payer: Self-pay | Admitting: Family Medicine

## 2018-04-08 LAB — URINE CULTURE: Organism ID, Bacteria: NO GROWTH

## 2018-04-08 NOTE — Telephone Encounter (Signed)
Copied from [place] [id number]. Topic: Quick Communication - See Telephone Encounter >> Apr 08, 2018  8:19 AM Mylinda Latina, NT wrote: CRM for notification. See Telephone encounter for: 04/08/18. Patient called and states she seen Apolonio Schneiders and she prescribed her sulfamethoxazole-trimethoprim (BACTRIM DS,SEPTRA DS) 800-160 MG tablet  for a UTI . She statres this medication is upsetting her stomach. She is wondering if she can call her in [place] .   [id number] [address], [place] [phone] (Phone) [phone] (Fax)

## 2018-04-08 NOTE — Telephone Encounter (Signed)
Please advise 

## 2018-04-09 NOTE — Assessment & Plan Note (Signed)
Will d/c zoloft and start wellbutrin, monitor closely for improvement in fatigue sxs. Risks and benefits reviewed

## 2018-04-09 NOTE — Patient Instructions (Signed)
Follow up in 1 month   

## 2018-04-13 ENCOUNTER — Ambulatory Visit: Payer: BLUE CROSS/BLUE SHIELD | Admitting: Family Medicine

## 2018-04-22 ENCOUNTER — Other Ambulatory Visit: Payer: Self-pay | Admitting: Family Medicine

## 2018-04-22 ENCOUNTER — Encounter: Payer: Self-pay | Admitting: Family Medicine

## 2018-04-22 MED ORDER — NICOTINE 21 MG/24HR TD PT24
21.0000 mg | MEDICATED_PATCH | Freq: Every day | TRANSDERMAL | 0 refills | Status: DC
Start: 2018-04-22 — End: 2018-05-23

## 2018-04-28 ENCOUNTER — Telehealth: Payer: Self-pay | Admitting: Family Medicine

## 2018-04-28 MED ORDER — BUPROPION HCL 75 MG PO TABS: 75.0000 mg | ORAL_TABLET | Freq: Two times a day (BID) | ORAL | 2 refills | Status: DC

## 2018-04-28 NOTE — Telephone Encounter (Signed)
Wellbutrin refill for a 90 day supply Last OV:04/06/18 Last refill:04/06/18 30 tab/2 refills BTD:[id number] Pharmacy: [id number] - [place], [place] [phone] (Phone) [phone] (Fax)

## 2018-04-28 NOTE — Telephone Encounter (Signed)
Patient notified, she states that she doesn't want to discontinue it, she is wondering if it can be because she was given generic instead of the brand.  She would like to know if you can have a prescription for 75mg , BRAND only.   [facility] [address]

## 2018-04-28 NOTE — Telephone Encounter (Signed)
OK to stop. Needs follow up if she wants something else.

## 2018-04-28 NOTE — Telephone Encounter (Signed)
Please Advise. Called pt advised that it's too early for refill, pt is wondering if it makes a difference on whether it's generic or the actual name brand medicine. Pt states that she feels like she is in another world when she takes it, thinking she may need a lower dosage. States she has stopped taking it.

## 2018-04-28 NOTE — Addendum Note (Signed)
Addended by: Valerie Roys on: 04/28/2018 05:34 PM   Modules accepted: Orders

## 2018-05-09 ENCOUNTER — Ambulatory Visit: Payer: BLUE CROSS/BLUE SHIELD | Admitting: Family Medicine

## 2018-05-13 ENCOUNTER — Other Ambulatory Visit: Payer: Self-pay | Admitting: Family Medicine

## 2018-05-16 ENCOUNTER — Ambulatory Visit: Payer: BLUE CROSS/BLUE SHIELD | Admitting: Family Medicine

## 2018-05-18 ENCOUNTER — Telehealth: Payer: Self-pay | Admitting: Family Medicine

## 2018-05-18 NOTE — Telephone Encounter (Signed)
Needs appointment for physical with pap.

## 2018-05-19 NOTE — Telephone Encounter (Signed)
Tried to reach patient. Wireless connection was not available.  Will try later

## 2018-05-23 ENCOUNTER — Other Ambulatory Visit: Payer: Self-pay | Admitting: Family Medicine

## 2018-05-24 NOTE — Telephone Encounter (Signed)
Nicotine Patch refill Last Refill:1 04/22/18# 28 Last OV: see pt message dated 04/22/18 PCP: [facility] :[place], [place] This is not on the pt medication list

## 2018-05-30 ENCOUNTER — Encounter: Payer: Self-pay | Admitting: Family Medicine

## 2018-05-30 ENCOUNTER — Other Ambulatory Visit: Payer: Self-pay | Admitting: Family Medicine

## 2018-05-30 MED ORDER — NICOTINE 14 MG/24HR TD PT24: 14.0000 mg | MEDICATED_PATCH | Freq: Every day | TRANSDERMAL | 1 refills | Status: DC

## 2018-05-31 ENCOUNTER — Encounter: Payer: Self-pay | Admitting: Family Medicine

## 2018-06-13 ENCOUNTER — Ambulatory Visit (INDEPENDENT_AMBULATORY_CARE_PROVIDER_SITE_OTHER): Payer: BLUE CROSS/BLUE SHIELD | Admitting: Family Medicine

## 2018-06-13 ENCOUNTER — Encounter: Payer: Self-pay | Admitting: Family Medicine

## 2018-06-13 VITALS — BP 90/70 | HR 77 | Temp 97.9°F | Resp 16 | Ht 67.0 in | Wt 125.6 lb

## 2018-06-13 DIAGNOSIS — Z7689 Persons encountering health services in other specified circumstances: Secondary | ICD-10-CM | POA: Diagnosis not present

## 2018-06-13 DIAGNOSIS — J449 Chronic obstructive pulmonary disease, unspecified: Secondary | ICD-10-CM

## 2018-06-13 DIAGNOSIS — J432 Centrilobular emphysema: Secondary | ICD-10-CM | POA: Insufficient documentation

## 2018-06-13 DIAGNOSIS — F411 Generalized anxiety disorder: Secondary | ICD-10-CM | POA: Diagnosis not present

## 2018-06-13 MED ORDER — PREDNISONE 50 MG PO TABS: 50.0000 mg | ORAL_TABLET | Freq: Every day | ORAL | 0 refills | Status: DC

## 2018-06-13 MED ORDER — ESCITALOPRAM OXALATE 10 MG PO TABS: 10.0000 mg | ORAL_TABLET | Freq: Every day | ORAL | 2 refills | Status: DC

## 2018-06-13 MED ORDER — DOXYCYCLINE HYCLATE 100 MG PO TABS: 100.0000 mg | ORAL_TABLET | Freq: Two times a day (BID) | ORAL | 0 refills | Status: DC

## 2018-06-13 NOTE — Progress Notes (Signed)
Subjective:    Patient ID: Lori Davies, female    DOB: May 21, 1971, 47 y.o.   MRN: [id number]  Lori Davies is a 47 y.o. female presenting on 06/13/2018 for Establish Care (nasal congestion, SOB ) and COPD  Transferring care to new PCP. Previously at [facility] in past.  HPI   MILD COPD EXACERBATION / Former Smoker Recent course with reports was at the beach [place], and she went to walk in clinic, given prednisone course, limited improved, initially tried Amoxicillin and Azithromycin limited improvement. Ultimately was treated with Doxycycline with improvement. She reviews prior known history of mild COPD but she does not agree with this dx. She was seen by Pulmonology back in 2016 had extensive testing with PFTs X-ray and Chest CT, showed some mild COPD and restrictive lung disease. Former smoker, quit 2 months ago with NRT patches, still using 14mg  patch - Today reports new concerns with worsening URI symptoms, again she states family member ill with URI symptoms as well. And now patient has some "tight" breathing at times. Some cough productive. - Fam history of emphysema - Improved on doxy in past. Not using anoro inhaler she is not interested in maintenance inhaler. She has old albuterol but not using regularly does not like it. Admits cough, tightness Denies chest pain, fever chills sweats hemoptysis  Anxiety, Chronic Reports that she denies any history of depression, primarily symptoms mostly with anxiety, prior wellbutrin and Zoloft in past with limited results. She is interested in other medication to help her anxiety.  Health Maintenance: Defer flu shot today due to illness  Depression screen [facility] 2/9 06/13/2018 03/28/2018 02/11/2017  Decreased Interest 0 0 0  Down, Depressed, Hopeless 1 0 0  PHQ - 2 Score 1 0 0  Altered sleeping 0 0 0  Tired, decreased energy 0 3 1  Change in appetite 0 1 0  Feeling bad or failure about yourself  0 0 0  Trouble concentrating 0 0 0    Moving slowly or fidgety/restless 0 0 0  Suicidal thoughts 0 0 0  PHQ-9 Score 1 4 1   Difficult doing work/chores Not difficult at all Somewhat difficult -   GAD 7 : Generalized Anxiety Score 06/13/2018 03/28/2018 02/11/2017 10/30/2015  Nervous, Anxious, on Edge 2 1 1 1   Control/stop worrying 2 1 0 0  Worry too much - different things 2 0 0 1  Trouble relaxing 2 1 0 1  Restless 1 0 0 0  Easily annoyed or irritable 2 1 0 1  Afraid - awful might happen 1 1 0 0  Total GAD 7 Score 12 5 1 4   Anxiety Difficulty Not difficult at all Somewhat difficult Not difficult at all Somewhat difficult    History reviewed. No pertinent past medical history. History reviewed. No pertinent surgical history. Social History   Socioeconomic History  . Marital status: Married    Spouse name: Not on file  . Number of children: Not on file  . Years of education: Not on file  . Highest education level: Not on file  Occupational History  . Not on file  Social Needs  . Financial resource strain: Not on file  . Food insecurity:    Worry: Not on file    Inability: Not on file  . Transportation needs:    Medical: Not on file    Non-medical: Not on file  Tobacco Use  . Smoking status: Former Smoker    Packs/day: 0.50  Years: 20.00    Pack years: 10.00    Types: Cigarettes    Last attempt to quit: 03/21/2018    Years since quitting: 0.2  . Smokeless tobacco: Former Systems developer  . Tobacco comment: Few years had breaks in smoking  Substance and Sexual Activity  . Alcohol use: No    Alcohol/week: 0.0 standard drinks  . Drug use: No  . Sexual activity: Yes    Birth control/protection: Injection  Lifestyle  . Physical activity:    Days per week: Not on file    Minutes per session: Not on file  . Stress: Not on file  Relationships  . Social connections:    Talks on phone: Not on file    Gets together: Not on file    Attends religious service: Not on file    Active member of club or organization: Not on  file    Attends meetings of clubs or organizations: Not on file    Relationship status: Not on file  . Intimate partner violence:    Fear of current or ex partner: Not on file    Emotionally abused: Not on file    Physically abused: Not on file    Forced sexual activity: Not on file  Other Topics Concern  . Not on file  Social History Narrative  . Not on file   Family History  Problem Relation Age of Onset  . Heart disease Mother   . Lung disease Mother   . Heart disease Father   . Hypertension Father   . Autism Son    Current Outpatient Medications on File Prior to Visit  Medication Sig  . albuterol (PROVENTIL HFA;VENTOLIN HFA) 108 (90 Base) MCG/ACT inhaler Inhale 2 puffs into the lungs every 4 (four) hours as needed for wheezing or shortness of breath.  . fexofenadine (ALLEGRA ALLERGY) 180 MG tablet Take 1 tablet (180 mg total) by mouth daily.  . montelukast (SINGULAIR) 10 MG tablet TAKE 1 TABLET BY MOUTH EVERYDAY AT BEDTIME  . nicotine (NICOTINE STEP 2) 14 mg/24hr patch Place 1 patch (14 mg total) onto the skin daily.  Marland Kitchen umeclidinium-vilanterol (ANORO ELLIPTA) 62.5-25 MCG/INH AEPB Inhale 1 puff into the lungs daily.   No current facility-administered medications on file prior to visit.     Review of Systems Per HPI unless specifically indicated above     Objective:    BP 90/70   Pulse 77   Temp 97.9 F (36.6 C) (Oral)   Resp 16   Ht 5\' 7"  (1.702 m)   Wt 125 lb 9.6 oz (57 kg)   LMP  (LMP Unknown)   SpO2 100%   BMI 19.67 kg/m   Wt Readings from Last 3 Encounters:  06/13/18 125 lb 9.6 oz (57 kg)  04/06/18 118 lb 1.6 oz (53.6 kg)  03/28/18 118 lb 6.4 oz (53.7 kg)    Physical Exam  Constitutional: She is oriented to person, place, and time. She appears well-developed and well-nourished. No distress.  Well-appearing, comfortable, cooperative  HENT:  Head: Normocephalic and atraumatic.  Mouth/Throat: Oropharynx is clear and moist.  Eyes: Conjunctivae are  normal. Right eye exhibits no discharge. Left eye exhibits no discharge.  Neck: Normal range of motion. Neck supple. No thyromegaly present.  Cardiovascular: Normal rate, regular rhythm, normal heart sounds and intact distal pulses.  No murmur heard. Pulmonary/Chest: Effort normal. No respiratory distress. She has wheezes. She has no rales.  Mild reduced air movement. Some deeper coarse breath sounds bilateral lower  lung fields. Occasional cough.  Musculoskeletal: Normal range of motion. She exhibits no edema.  Lymphadenopathy:    She has no cervical adenopathy.  Neurological: She is alert and oriented to person, place, and time.  Skin: Skin is warm and dry. No rash noted. She is not diaphoretic. No erythema.  Psychiatric: She has a normal mood and affect. Her behavior is normal.  Well groomed, good eye contact, normal speech and thoughts  Nursing note and vitals reviewed.  Results for orders placed or performed in visit on 04/06/18  Urine Culture  Result Value Ref Range   Urine Culture, Routine Final report    Organism ID, Bacteria No growth   Microscopic Examination  Result Value Ref Range   WBC, UA 0-5 0 - 5 /hpf   RBC, UA 0-2 0 - 2 /hpf   Epithelial Cells (non renal) 0-10 0 - 10 /hpf   Renal Epithel, UA 0-10 (A) None seen /hpf   Bacteria, UA Few None seen/Few  UA/M w/rflx Culture, Routine  Result Value Ref Range   Specific Gravity, UA 1.015 1.005 - 1.030   pH, UA 7.0 5.0 - 7.5   Color, UA Yellow Yellow   Appearance Ur Clear Clear   Leukocytes, UA 1+ (A) Negative   Protein, UA Negative Negative/Trace   Glucose, UA Negative Negative   Ketones, UA Negative Negative   RBC, UA Trace (A) Negative   Bilirubin, UA Negative Negative   Urobilinogen, Ur 0.2 0.2 - 1.0 mg/dL   Nitrite, UA Negative Negative   Microscopic Examination See below:       Assessment & Plan:   Problem List Items Addressed This Visit    COPD, mild ([place]) Consistent with mild acute exacerbation of COPD  with worsening productive cough. Similar to prior exacerbations, last few weeks ago, resolved on doxy, failed other antibiotics. - No hypoxia (100% on RA), afebrile, no recent hospitalization - Not using maintenance inhaler Prior PFTs, imaging 2016  Plan: 1. Start Prednisone 50mg  x 5 day steroid burst 2. Start Doxycycline 100mg  BID x 7 days precautions given - Ultimately we agreed to treat current flare aggressively since early onset symptoms gradually worsening and similar to prior flare that took long time to treat - Advised to start existing albuterol q 4 hr regularly x 2-3 day - Consider future Chest X-ray if not improved RTC about 1 week if not improving, otherwise strict return criteria to go to ED  Smoking cessation, remain quit, continue NRT Ultimately may need resume maintenance inhalers in future vs return to Pulm    Relevant Medications   predniSONE (DELTASONE) 50 MG tablet   doxycycline (VIBRA-TABS) 100 MG tablet   GAD (generalized anxiety disorder) - Primary  Suspected new dx GAD now with gradual worsening causing more difficulty functioning No insomnia or mood disorder by history - Failed: Zoloft, wellbutrin - No prior dx / Psych / counseling  Plan: 1. Discussion on new diagnosis anxiety, management, complications 2. Start Escitalopram 10mg  daily (may cut in HALF for 5mg ) AM with food, counseling on potential side effects risks, reviewed possible GI intolerance, insomnia (although likely to improve this given anxiety likely source of insomnia), anticipate 4-6 weeks for notable effect, may need titrate dose to 10-20 in future 3. Advised recommend therapy / counseling in future 4. Follow-up 6 wk to 3 mo anxiety, med adjust, GAD7/PHQ9     Relevant Medications   escitalopram (LEXAPRO) 10 MG tablet    Other Visit Diagnoses    Encounter to  establish care     Review prior PCP records in EHR      Meds ordered this encounter  Medications  . escitalopram (LEXAPRO) 10 MG  tablet    Sig: Take 1 tablet (10 mg total) by mouth daily. May start with half tablet for dose 5mg  if preferred for 1-2 weeks.    Dispense:  30 tablet    Refill:  2  . predniSONE (DELTASONE) 50 MG tablet    Sig: Take 1 tablet (50 mg total) by mouth daily with breakfast.    Dispense:  5 tablet    Refill:  0  . doxycycline (VIBRA-TABS) 100 MG tablet    Sig: Take 1 tablet (100 mg total) by mouth 2 (two) times daily. For 7 days. Take with full glass of water, stay upright 30 min after taking.    Dispense:  14 tablet    Refill:  0    Follow up plan: Return in about 3 months (around 09/12/2018) for GAD med adjust, COPD.  [name], [facility] 06/13/2018, 3:50 PM

## 2018-06-13 NOTE — Patient Instructions (Addendum)
Thank you for coming to the office today.  1. It sounds like you had an Upper Respiratory Virus that has settled into a Bronchitis, lower respiratory tract infection. I don't have concerns for pneumonia today, and think that this should gradually improve. Once you are feeling better, the cough may take a few weeks to fully resolve. I do hear wheezing and coarse breath sounds, this may be due to the virus, also could be related to history of smoking possible MILD COPD  - Start Prednisone 50mg  daily for next 5 days - this will open up lungs allow you to breath better and treat that wheezing or bronchospasm  - start Doxycyclien twice a day for 7 days  - Use Albuterol inhaler 2 puffs every 4-6 hours around the clock for next 2-3 days, max up to 5 days then use as needed   - Use nasal saline (Simply Saline or Ocean Spray) to flush nasal congestion multiple times a day, may help cough - Drink plenty of fluids to improve congestion  If your symptoms seem to worsen instead of improve over next several days, including significant fever / chills, worsening shortness of breath, worsening wheezing, or nausea / vomiting and can't take medicines - return sooner or go to hospital Emergency Department for more immediate treatment.  ---- As discussed, it sounds like your symptoms are primarily related to anxiety / adjustment disorder. This is a very common problem and be related to several factors, including life stressors. Start treatment with Escitalopram (generic lexapro), take 10 daily - may start with LOWER DOSE HALF TAB for 5mg  if you prefer . As discussed most anxiety medications are also used for mood disorders such as depression, because they work on similar chemicals in your brain. It may take up to 3-4 weeks for the medicine to take full effect and for you to notice a difference, sometimes you may notice it working sooner, otherwise we may need to adjust the dose.  Please schedule a Follow-up  Appointment to: Return in about 3 months (around 09/12/2018) for GAD med adjust, COPD.  If you have any other questions or concerns, please feel free to call the office or send a message through [place]. You may also schedule an earlier appointment if necessary.  Additionally, you may be receiving a survey about your experience at our office within a few days to 1 week by e-mail or mail. We value your feedback.  [name], DO [place]

## 2018-06-14 ENCOUNTER — Encounter: Payer: Self-pay | Admitting: Family Medicine

## 2018-06-14 ENCOUNTER — Ambulatory Visit: Payer: BLUE CROSS/BLUE SHIELD | Admitting: Family Medicine

## 2018-07-05 ENCOUNTER — Other Ambulatory Visit: Payer: Self-pay | Admitting: Family Medicine

## 2018-07-05 DIAGNOSIS — F411 Generalized anxiety disorder: Secondary | ICD-10-CM

## 2018-07-22 ENCOUNTER — Ambulatory Visit (INDEPENDENT_AMBULATORY_CARE_PROVIDER_SITE_OTHER): Payer: BLUE CROSS/BLUE SHIELD

## 2018-07-22 DIAGNOSIS — Z23 Encounter for immunization: Secondary | ICD-10-CM | POA: Diagnosis not present

## 2018-07-27 ENCOUNTER — Other Ambulatory Visit: Payer: Self-pay | Admitting: Family Medicine

## 2018-09-19 ENCOUNTER — Encounter: Payer: Self-pay | Admitting: Family Medicine

## 2018-09-19 ENCOUNTER — Ambulatory Visit (INDEPENDENT_AMBULATORY_CARE_PROVIDER_SITE_OTHER): Payer: BLUE CROSS/BLUE SHIELD | Admitting: Family Medicine

## 2018-09-19 ENCOUNTER — Other Ambulatory Visit: Payer: Self-pay | Admitting: Family Medicine

## 2018-09-19 VITALS — BP 102/61 | HR 73 | Temp 97.8°F | Resp 16 | Ht 67.0 in | Wt 127.0 lb

## 2018-09-19 DIAGNOSIS — J0110 Acute frontal sinusitis, unspecified: Secondary | ICD-10-CM | POA: Diagnosis not present

## 2018-09-19 DIAGNOSIS — Z0000 Encounter for general adult medical examination without abnormal findings: Secondary | ICD-10-CM

## 2018-09-19 DIAGNOSIS — J449 Chronic obstructive pulmonary disease, unspecified: Secondary | ICD-10-CM

## 2018-09-19 DIAGNOSIS — K219 Gastro-esophageal reflux disease without esophagitis: Secondary | ICD-10-CM

## 2018-09-19 DIAGNOSIS — F411 Generalized anxiety disorder: Secondary | ICD-10-CM

## 2018-09-19 DIAGNOSIS — E7800 Pure hypercholesterolemia, unspecified: Secondary | ICD-10-CM

## 2018-09-19 DIAGNOSIS — R7309 Other abnormal glucose: Secondary | ICD-10-CM

## 2018-09-19 MED ORDER — PREDNISONE 50 MG PO TABS: 50.0000 mg | ORAL_TABLET | Freq: Every day | ORAL | 0 refills | Status: DC

## 2018-09-19 MED ORDER — ESCITALOPRAM OXALATE 10 MG PO TABS: 10.0000 mg | ORAL_TABLET | Freq: Every day | ORAL | 3 refills | Status: DC

## 2018-09-19 MED ORDER — AMOXICILLIN 500 MG PO CAPS: 500.0000 mg | ORAL_CAPSULE | Freq: Two times a day (BID) | ORAL | 0 refills | Status: DC

## 2018-09-19 NOTE — Assessment & Plan Note (Signed)
Improved, controlled GAD now on SSRI Lexapro Off Wellbutrin  Plan Continue current Escitalopram 10mg  daily, 90 day refill sent Follow-up as planned

## 2018-09-19 NOTE — Progress Notes (Signed)
Subjective:    Patient ID: Lori Davies, female    DOB: [birth year], 46 y.o.   MRN: [id number]  Lori Davies is a 47 y.o. female presenting on 09/19/2018 for chest congestion (onset 5 days fever last week, nausea which is improved but still has HA, stuffy nose, drainage, sore throat)  Accompanied by daughter, Trey Bebee.  HPI   Sinusitis / Fever / Cough Reports symptoms started about 5 days ago with fever, up to 102.85F, nausea and vomiting, reduced appetite, and then worsening to sinus congestion with difficulty breathing - Taking Advil regularly - She tried some Doxycycline on Day 1 from family member but was nausea and vomited back up - History of mild COPD, previously treated for this >3 months ago, now without persistent cough or wheezing but she is concerned about this. 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 - Today patient reports that she is doing very well. Her symptoms of anxiety are well controlled on current med Lexapro, she continues on 10mg . She still denies any depression, see scores below  UTD Flu Vaccine 07/22/18 already  Depression screen [facility] 2/9 09/19/2018 09/19/2018 06/13/2018  Decreased Interest 0 0 0  Down, Depressed, Hopeless 0 0 1  PHQ - 2 Score 0 0 1  Altered sleeping 0 - 0  Tired, decreased energy 0 - 0  Change in appetite 0 - 0  Feeling bad or failure about yourself  0 - 0  Trouble concentrating 0 - 0  Moving slowly or fidgety/restless 0 - 0  Suicidal thoughts 0 - 0  PHQ-9 Score 0 - 1  Difficult doing work/chores Not difficult at all -  Not difficult at all   GAD 7 : Generalized Anxiety Score 09/19/2018 06/13/2018 03/28/2018 02/11/2017  Nervous, Anxious, on Edge 0 2 1 1   Control/stop worrying 0 2 1 0  Worry too much - different things 0 2 0 0  Trouble relaxing 0 2 1 0  Restless 0 1 0 0  Easily annoyed or irritable 0 2 1 0  Afraid - awful might happen 0 1 1 0  Total GAD 7 Score 0 12 5 1   Anxiety Difficulty Not difficult at all Not difficult at all Somewhat difficult Not difficult at all    Social History   Tobacco Use  . Smoking status: Former Smoker    Packs/day: 0.50    Years: 20.00    Pack years: 10.00    Types: Cigarettes    Last attempt to quit: 03/21/2018    Years since quitting: 0.4  . Smokeless tobacco: Former Systems developer  . Tobacco comment: Few years had breaks in smoking  Substance Use Topics  . Alcohol use: No    Alcohol/week: 0.0 standard drinks  . Drug use: No    Review of Systems Per HPI unless specifically indicated above     Objective:    BP 102/61   Pulse 73   Temp 97.8 F (36.6 C) (Oral)   Resp 16   Ht 5\' 7"  (1.702 m)   Wt 127 lb (57.6 kg)   LMP  (LMP Unknown)  SpO2 95%   BMI 19.89 kg/m   Wt Readings from Last 3 Encounters:  09/19/18 127 lb (57.6 kg)  06/13/18 125 lb 9.6 oz (57 kg)  04/06/18 118 lb 1.6 oz (53.6 kg)    Physical Exam Vitals signs and nursing note reviewed.  Constitutional:      General: She is not in acute distress.    Appearance: She is well-developed. She is not diaphoretic.     Comments: Tired appearing but mostly well, comfortable, cooperative  HENT:     Head: Normocephalic and atraumatic.     Comments: Frontal / maxillary sinuses non-tender. Nares patent without purulence or edema. Bilateral TMs with some clear effusion without erythema or bulging. Oropharynx mild generalized erythema without exudates, edema or asymmetry. Eyes:     General:        Right eye: No discharge.        Left eye: No discharge.     Conjunctiva/sclera: Conjunctivae normal.  Neck:      Musculoskeletal: Normal range of motion and neck supple.     Thyroid: No thyromegaly.  Cardiovascular:     Rate and Rhythm: Normal rate and regular rhythm.     Heart sounds: Normal heart sounds. No murmur.  Pulmonary:     Effort: Pulmonary effort is normal. No respiratory distress.     Breath sounds: No wheezing or rales.     Comments: Mild reduced air movement bilateral lower lung fields and with some coarse breath sounds. No focal wheezing or crackles today. Occasional cough. But speaks full sentences. Musculoskeletal: Normal range of motion.  Lymphadenopathy:     Cervical: No cervical adenopathy.  Skin:    General: Skin is warm and dry.     Findings: No erythema or rash.  Neurological:     Mental Status: She is alert and oriented to person, place, and time.  Psychiatric:        Behavior: Behavior normal.     Comments: Well groomed, good eye contact, normal speech and thoughts        Assessment & Plan:   Problem List Items Addressed This Visit    GAD (generalized anxiety disorder)    Improved, controlled GAD now on SSRI Lexapro Off Wellbutrin  Plan Continue current Escitalopram 10mg  daily, 90 day refill sent Follow-up as planned      Relevant Medications   escitalopram (LEXAPRO) 10 MG tablet    Other Visit Diagnoses    Acute non-recurrent frontal sinusitis    -  Primary   Relevant Medications   amoxicillin (AMOXIL) 500 MG capsule   predniSONE (DELTASONE) 50 MG tablet      Consistent with acute frontal sinusitis, likely initially viral URI worsening concern for bacterial infection given severity and fever history. History of mild COPD, at risk of repeat COPD flare if worsening or progress to lower respiratory  Plan: 1. Cover with Amoxicillin BID x 10 days for sinus, cannot take augmentin d/t nausea reaction. 2. Continue supportive medications NSAID PRN, decongestant, nasal spray - Printed rx for Prednisone as back up rx only 50mg  daily for 5 days if worsening  symptoms of COPD flare in next 24-48 hours, especially with [place] office closed. Return criteria reviewed   Meds ordered this encounter  Medications  . escitalopram (LEXAPRO) 10 MG tablet    Sig: Take 1 tablet (10 mg total) by mouth daily.    Dispense:  90 tablet    Refill:  3  . amoxicillin (AMOXIL) 500 MG capsule  Sig: Take 1 capsule (500 mg total) by mouth 2 (two) times daily. For 10 days    Dispense:  20 capsule    Refill:  0  . predniSONE (DELTASONE) 50 MG tablet    Sig: Take 1 tablet (50 mg total) by mouth daily with breakfast.    Dispense:  5 tablet    Refill:  0    Follow up plan: Return in about 6 months (around 03/21/2019) for Annual Physical.  Future labs ordered for 03/27/19  [name], [facility] 09/19/2018, 8:55 AM

## 2018-09-19 NOTE — Patient Instructions (Signed)
Thank you for coming to the office today.  1. It sounds like you have a Sinusitis (Bacterial Infection) - this most likely started as an Upper Respiratory Virus that has settled into an infection. Allergies can also cause this. - Start Amoxicillin 1 pill twice daily (breakfast and dinner, with food and plenty of water) for 10 days, complete entire course, do not stop early even if feeling better  - Recommend to keep using Nasal Saline spray multiple times a day to help flush out congestion and clear sinuses - Improve hydration by drinking plenty of clear fluids (water, gatorade) to reduce secretions and thin congestion - Congestion draining down throat can cause irritation. May try warm herbal tea with honey, cough drops - Can take Tylenol or Ibuprofen as needed for fevers - May continue over the counter cold medicine as you are, I would not use any decongestant or mucinex longer than 7 days.  If you develop persistent fever >101F for at least 3 consecutive days, headaches with sinus pain or pressure or persistent earache, please schedule a follow-up evaluation within next few days to week.   DUE for FASTING BLOOD WORK (no food or drink after midnight before the lab appointment, only water or coffee without cream/sugar on the morning of)  SCHEDULE "Lab Only" visit in the morning at the clinic for lab draw in 6 MONTHS   - Make sure Lab Only appointment is at about 1 week before your next appointment, so that results will be available  For Lab Results, once available within 2-3 days of blood draw, you can can log in to MyChart online to view your results and a brief explanation. Also, we can discuss results at next follow-up visit.   Please schedule a Follow-up Appointment to: No follow-ups on file.  If you have any other questions or concerns, please feel free to call the office or send a message through [place]. You may also schedule an earlier appointment if necessary.  Additionally, you  may be receiving a survey about your experience at our office within a few days to 1 week by e-mail or mail. We value your feedback.  [name], DO [place]

## 2018-09-21 DIAGNOSIS — J0110 Acute frontal sinusitis, unspecified: Secondary | ICD-10-CM

## 2018-09-22 MED ORDER — DOXYCYCLINE HYCLATE 100 MG PO TABS: 100.0000 mg | ORAL_TABLET | Freq: Two times a day (BID) | ORAL | 0 refills | Status: DC

## 2018-10-10 DIAGNOSIS — J301 Allergic rhinitis due to pollen: Secondary | ICD-10-CM

## 2018-10-10 MED ORDER — IPRATROPIUM BROMIDE 0.06 % NA SOLN: 2.0000 | Freq: Four times a day (QID) | NASAL | 1 refills | Status: DC

## 2018-10-10 NOTE — Addendum Note (Signed)
Addended by: Olin Hauser on: 10/10/2018 05:01 PM   Modules accepted: Orders

## 2018-10-25 ENCOUNTER — Ambulatory Visit (INDEPENDENT_AMBULATORY_CARE_PROVIDER_SITE_OTHER): Payer: BLUE CROSS/BLUE SHIELD | Admitting: Family Medicine

## 2018-10-25 ENCOUNTER — Encounter: Payer: Self-pay | Admitting: Family Medicine

## 2018-10-25 VITALS — BP 90/64 | HR 64 | Temp 97.5°F | Resp 16 | Ht 67.0 in | Wt 136.0 lb

## 2018-10-25 DIAGNOSIS — L608 Other nail disorders: Secondary | ICD-10-CM

## 2018-10-25 DIAGNOSIS — L603 Nail dystrophy: Secondary | ICD-10-CM

## 2018-10-25 DIAGNOSIS — B351 Tinea unguium: Secondary | ICD-10-CM

## 2018-10-25 MED ORDER — TERBINAFINE HCL 250 MG PO TABS: 250.0000 mg | ORAL_TABLET | Freq: Every day | ORAL | 1 refills | Status: DC

## 2018-10-25 NOTE — Progress Notes (Signed)
Subjective:    Patient ID: Lori Davies, female    DOB: Feb 02, 1971, 48 y.o.   MRN: [id number]  Lori Davies is a 48 y.o. female presenting on 10/25/2018 for nail color change (onset month)   HPI   NAIL DYSTROPHY / Possible Onychomycosis Reports nail color change and slight pitting of some of her nails on both hands and R foot, seems R hand is worse with R thumb and index finger having changes, seems onset 1 month ago gradual change nail grew out with discoloration and there was slight indentation within nail, seemed more flat - She thinks nail is thicker now - Not affecting all nails - She is asking about cardiovascular because prior family member had something similar - She denies any history of psoriasis or other skin condition, eczema, dermatitis - Denies nail biting or picking Denies any fever, chills, redness, other skin changes, nail pain or nail splitting chipping, trauma   Depression screen [facility] 2/9 10/25/2018 09/19/2018 09/19/2018  Decreased Interest 0 0 0  Down, Depressed, Hopeless 0 0 0  PHQ - 2 Score 0 0 0  Altered sleeping 0 0 -  Tired, decreased energy 0 0 -  Change in appetite 0 0 -  Feeling bad or failure about yourself  0 0 -  Trouble concentrating 0 0 -  Moving slowly or fidgety/restless 0 0 -  Suicidal thoughts 0 0 -  PHQ-9 Score 0 0 -  Difficult doing work/chores Not difficult at all Not difficult at all -    Social History   Tobacco Use  . Smoking status: Former Smoker    Packs/day: 0.50    Years: 20.00    Pack years: 10.00    Types: Cigarettes    Last attempt to quit: 03/21/2018    Years since quitting: 0.5  . Smokeless tobacco: Former Systems developer  . Tobacco comment: Few years had breaks in smoking  Substance Use Topics  . Alcohol use: No    Alcohol/week: 0.0 standard drinks  . Drug use: No    Review of Systems Per HPI unless specifically indicated above     Objective:    BP 90/64   Pulse 64   Temp (!) 97.5 F (36.4 C) (Oral)   Resp 16   Ht  5\' 7"  (1.702 m)   Wt 136 lb (61.7 kg)   LMP  (LMP Unknown)   BMI 21.30 kg/m   Wt Readings from Last 3 Encounters:  10/25/18 136 lb (61.7 kg)  09/19/18 127 lb (57.6 kg)  06/13/18 125 lb 9.6 oz (57 kg)    Physical Exam Vitals signs and nursing note reviewed.  Constitutional:      General: She is not in acute distress.    Appearance: She is well-developed. She is not diaphoretic.     Comments: Well-appearing, comfortable, cooperative  HENT:     Head: Normocephalic and atraumatic.  Eyes:     General:        Right eye: No discharge.        Left eye: No discharge.     Conjunctiva/sclera: Conjunctivae normal.  Cardiovascular:     Rate and Rhythm: Normal rate.  Pulmonary:     Effort: Pulmonary effort is normal.  Skin:    General: Skin is warm and dry.     Findings: No erythema or rash.     Comments: Fingernail - Bilateral fingernails with R hand thumb, index finger, and L hand index finger with some changes to nail  with distal half of nail slightly yellowish discoloration, and flat appearance there is central slight pit within nail without split or opening, increased longitudinal ridges without brittle thickening, no chip of nail, seems to have normal nail growing out from bed now.  R 2nd toenail similar changes.  Neurological:     Mental Status: She is alert and oriented to person, place, and time.  Psychiatric:        Behavior: Behavior normal.     Comments: Well groomed, good eye contact, normal speech and thoughts      R index finger       Results for orders placed or performed in visit on 04/06/18  Urine Culture  Result Value Ref Range   Urine Culture, Routine Final report    Organism ID, Bacteria No growth   Microscopic Examination  Result Value Ref Range   WBC, UA 0-5 0 - 5 /hpf   RBC, UA 0-2 0 - 2 /hpf   Epithelial Cells (non renal) 0-10 0 - 10 /hpf   Renal Epithel, UA 0-10 (A) None seen /hpf   Bacteria, UA Few None seen/Few  UA/M w/rflx Culture, Routine    Result Value Ref Range   Specific Gravity, UA 1.015 1.005 - 1.030   pH, UA 7.0 5.0 - 7.5   Color, UA Yellow Yellow   Appearance Ur Clear Clear   Leukocytes, UA 1+ (A) Negative   Protein, UA Negative Negative/Trace   Glucose, UA Negative Negative   Ketones, UA Negative Negative   RBC, UA Trace (A) Negative   Bilirubin, UA Negative Negative   Urobilinogen, Ur 0.2 0.2 - 1.0 mg/dL   Nitrite, UA Negative Negative   Microscopic Examination See below:       Assessment & Plan:   Problem List Items Addressed This Visit    None    Visit Diagnoses    Median longitudinal dystrophy of nail    -  Primary   Change in nail appearance       Onychomycosis       Relevant Medications   terbinafine (LAMISIL) 250 MG tablet      Uncertain etiology of some fingernail and one toenail change with longitudinal dystrophy of nail w/ discoloration and central pitting. Possibly can be mechanical defect nail dystrophy that may grow out and spontaneously resolve could have been due to nail injury or repetitive activity. - Considered psoriasis for nail pitting as well, seems less likely on exam - Other possible onychomycosis, may be possible, does not seem entirely consistent on exam - Considered peripheral vascular changes as possible etiology, not consistent with clubbing or other etiology, has known former smoker at risk, history of COPD  Plan - Trial of anti fungal therapy first - terbinafine 250mg  oral daily x 4-6 weeks, may extend to 8 wk if needed - If not effective can stop after 4 weeks - Avoid any nail injury or other triggering etiology - Consider future skin condition as possibility, may refer to Dermatology for 2nd opinion if no other concerns. Lastly if concern for peripheral vascular disease remains high we can consider refer to Cardiologist vs Vascular for some testing work-up  Meds ordered this encounter  Medications  . terbinafine (LAMISIL) 250 MG tablet    Sig: Take 1 tablet (250 mg  total) by mouth daily. For 6-8 weeks for nails    Dispense:  30 tablet    Refill:  1    Follow up plan: Return in about 4 weeks (around  11/22/2018), or if symptoms worsen or fail to improve, for Follow-up 4-6 weeks if not improved nails.   [name], DO [facility] 10/25/2018, 4:03 PM

## 2018-10-25 NOTE — Patient Instructions (Addendum)
Thank you for coming to the office today.  Start Terbinafine 250mg  daily for 4-8 weeks, if resolving well with medicine, finish course. If not helping at all after 4 weeks, can stop.  May spontaneously improve. If this is just a "Longitudinal defect of nail" - try to avoid any injury, repetitive nail trauma, or chemical exposure.  Less likely to be cardiovascular - we can check this out in the future.  Possibly a type of psoriasis affecting nails, which can occur   Please schedule a Follow-up Appointment to: Return in about 4 weeks (around 11/22/2018), or if symptoms worsen or fail to improve, for Follow-up 4-6 weeks if not improved nails.  If you have any other questions or concerns, please feel free to call the office or send a message through [place]. You may also schedule an earlier appointment if necessary.  Additionally, you may be receiving a survey about your experience at our office within a few days to 1 week by e-mail or mail. We value your feedback.  [name], DO [place]

## 2018-12-07 ENCOUNTER — Telehealth: Payer: BLUE CROSS/BLUE SHIELD | Admitting: Family

## 2018-12-07 DIAGNOSIS — H60502 Unspecified acute noninfective otitis externa, left ear: Secondary | ICD-10-CM

## 2018-12-07 MED ORDER — NEOMYCIN-POLYMYXIN-HC 3.5-10000-1 OT SOLN: 3.0000 [drp] | Freq: Four times a day (QID) | OTIC | 0 refills | Status: DC

## 2018-12-07 MED ORDER — CIPROFLOXACIN-HYDROCORTISONE 0.2-1 % OT SUSP: 3.0000 [drp] | Freq: Two times a day (BID) | OTIC | 0 refills | Status: DC

## 2018-12-07 MED ORDER — CIPROFLOXACIN HCL 500 MG PO TABS: 500.0000 mg | ORAL_TABLET | Freq: Two times a day (BID) | ORAL | 0 refills | Status: DC

## 2018-12-07 NOTE — Addendum Note (Signed)
Addended by: Dutch Quint B on: 12/07/2018 05:02 PM   Modules accepted: Orders

## 2018-12-07 NOTE — Progress Notes (Signed)
E Visit for Swimmer's Ear  We are sorry that you are not feeling well. Here is how we plan to help!  Based on what you have shared with me it looks like you have swimmers ear. Swimmer's ear is a redness or swelling, irritation, or infection of your outer ear canal.  These symptoms usually occur within a few days of swimming.  Your ear canal is a tube that goes from the opening of the ear to the eardrum.  When water stays in your ear canal, germs can grow.  This is a painful condition that often happens to children and swimmers of all ages.  It is not contagious and oral antibiotics are not required to treat uncomplicated swimmer's ear.  The usual symptoms include: Itching inside the ear, Redness or a sense of swelling in the ear, Pain when the ear is tugged on when pressure is placed on the ear, Pus draining from the infected ear. and I have prescribed: Ciprofloxin 0.2% and hydrocortisone 1% otic suspension 3 drops in affected ears twice daily for 7 days  Cipro 500mg , one tablet my mouth twice a day for 10 days.  Approximately 5 minutes was spent documenting and reviewing patient's chart.    In certain cases swimmer's ear may progress to a more serious bacterial infection of the middle or inner ear.  If you have a fever 102 and up and significantly worsening symptoms, this could indicate a more serious infection moving to the middle/inner and needs face to face evaluation in an office by a provider.  Your symptoms should improve over the next 3 days and should resolve in about 7 days.  HOME CARE:   Wash your hands frequently.  Do not place the tip of the bottle on your ear or touch it with your fingers.  You can take Acetominophen 650 mg every 4-6 hours as needed for pain.  If pain is severe or moderate, you can apply a heating pad (set on low) or hot water bottle (wrapped in a towel) to outer ear for 20 minutes.  This will also increase drainage.  Avoid ear plugs  Do not use  Q-tips  After showers, help the water run out by tilting your head to one side.  GET HELP RIGHT AWAY IF:   Fever is over 102.2 degrees.  You develop progressive ear pain or hearing loss.  Ear symptoms persist longer than 3 days after treatment.  MAKE SURE YOU:   Understand these instructions.  Will watch your condition.  Will get help right away if you are not doing well or get worse.  TO PREVENT SWIMMER'S EAR:  Use a bathing cap or custom fitted swim molds to keep your ears dry.  Towel off after swimming to dry your ears.  Tilt your head or pull your earlobes to allow the water to escape your ear canal.  If there is still water in your ears, consider using a hairdryer on the lowest setting.  Thank you for choosing an e-visit. Your e-visit answers were reviewed by a board certified advanced clinical practitioner to complete your personal care plan. Depending upon the condition, your plan could have included both over the counter or prescription medications. Please review your pharmacy choice. Be sure that the pharmacy you have chosen is open so that you can pick up your prescription now.  If there is a problem you may message your provider in [place] to have the prescription routed to another pharmacy. Your safety is important to  [place]. If you have drug allergies check your prescription carefully.  For the next 24 hours, you can use MyChart to ask questions about today's visit, request a non-urgent call back, or ask for a work or school excuse from your e-visit provider. You will get an email in the next two days asking about your experience. I hope that your e-visit has been valuable and will speed your recovery.

## 2018-12-12 ENCOUNTER — Encounter: Payer: Self-pay | Admitting: Physician Assistant

## 2018-12-12 ENCOUNTER — Telehealth: Payer: BLUE CROSS/BLUE SHIELD | Admitting: Physician Assistant

## 2018-12-12 DIAGNOSIS — J208 Acute bronchitis due to other specified organisms: Secondary | ICD-10-CM | POA: Diagnosis not present

## 2018-12-12 DIAGNOSIS — J432 Centrilobular emphysema: Secondary | ICD-10-CM

## 2018-12-12 DIAGNOSIS — H9209 Otalgia, unspecified ear: Secondary | ICD-10-CM | POA: Diagnosis not present

## 2018-12-12 DIAGNOSIS — B9689 Other specified bacterial agents as the cause of diseases classified elsewhere: Secondary | ICD-10-CM

## 2018-12-12 MED ORDER — AZITHROMYCIN 250 MG PO TABS: ORAL_TABLET | ORAL | 0 refills | Status: DC

## 2018-12-12 NOTE — Progress Notes (Signed)
We are sorry that you are not feeling well.  Here is how we plan to help!  Based on your presentation I believe you most likely have A cough due to bacteria.  When patients have a fever and a productive cough with a change in color or increased sputum production, we are concerned about bacterial bronchitis.  If left untreated it can progress to pneumonia.  If your symptoms do not improve with your treatment plan it is important that you contact your provider.   I have prescribed Azithromyin 250 mg: two tablets now and then one tablet daily for 4 additonal days    As per our conversation, you stated that you started mild wheezing today, denied any shortness of breath or chest pain. Continue your albuterol inhaler, and take the tessalon Perles that you currently have.  If the  prescribed antibiotic does not help with your ear pain, please follow up with your doctor. Any worsening, or any new symptoms go to the ER, as that might be an exacerbation of your COPD.   From your responses in the eVisit questionnaire you describe inflammation in the upper respiratory tract which is causing a significant cough.  This is commonly called Bronchitis and has four common causes:    Allergies  Viral Infections  Acid Reflux  Bacterial Infection Allergies, viruses and acid reflux are treated by controlling symptoms or eliminating the cause. An example might be a cough caused by taking certain blood pressure medications. You stop the cough by changing the medication. Another example might be a cough caused by acid reflux. Controlling the reflux helps control the cough.  USE OF BRONCHODILATOR ("RESCUE") INHALERS: There is a risk from using your bronchodilator too frequently.  The risk is that over-reliance on a medication which only relaxes the muscles surrounding the breathing tubes can reduce the effectiveness of medications prescribed to reduce swelling and congestion of the tubes themselves.  Although you feel  brief relief from the bronchodilator inhaler, your asthma may actually be worsening with the tubes becoming more swollen and filled with mucus.  This can delay other crucial treatments, such as oral steroid medications. If you need to use a bronchodilator inhaler daily, several times per day, you should discuss this with your provider.  There are probably better treatments that could be used to keep your asthma under control.     HOME CARE . Only take medications as instructed by your medical team. . Complete the entire course of an antibiotic. . Drink plenty of fluids and get plenty of rest. . Avoid close contacts especially the very young and the elderly . Cover your mouth if you cough or cough into your sleeve. . Always remember to wash your hands . A steam or ultrasonic humidifier can help congestion.   GET HELP RIGHT AWAY IF: . You develop worsening fever. . You become short of breath . You cough up blood. . Your symptoms persist after you have completed your treatment plan MAKE SURE YOU   Understand these instructions.  Will watch your condition.  Will get help right away if you are not doing well or get worse.  Your e-visit answers were reviewed by a board certified advanced clinical practitioner to complete your personal care plan.  Depending on the condition, your plan could have included both over the counter or prescription medications. If there is a problem please reply  once you have received a response from your provider. Your safety is important to [place].  If  you have drug allergies check your prescription carefully.    You can use MyChart to ask questions about today's visit, request a non-urgent call back, or ask for a work or school excuse for 24 hours related to this e-Visit. If it has been greater than 24 hours you will need to follow up with your provider, or enter a new e-Visit to address those concerns. You will get an e-mail in the next two days asking about your  experience.  I hope that your e-visit has been valuable and will speed your recovery. Thank you for using e-visits.  I have spent 7 min in completion and review of this note- Lacy Duverney [facility]

## 2018-12-14 MED ORDER — PREDNISONE 50 MG PO TABS: 50.0000 mg | ORAL_TABLET | Freq: Every day | ORAL | 0 refills | Status: DC

## 2018-12-14 NOTE — Addendum Note (Signed)
Addended by: Olin Hauser on: 12/14/2018 08:08 AM   Modules accepted: Orders

## 2019-03-27 ENCOUNTER — Other Ambulatory Visit: Payer: BLUE CROSS/BLUE SHIELD

## 2019-04-03 ENCOUNTER — Encounter: Payer: BLUE CROSS/BLUE SHIELD | Admitting: Family Medicine

## 2019-04-04 ENCOUNTER — Telehealth: Payer: BLUE CROSS/BLUE SHIELD | Admitting: Family

## 2019-04-04 DIAGNOSIS — B9689 Other specified bacterial agents as the cause of diseases classified elsewhere: Secondary | ICD-10-CM | POA: Diagnosis not present

## 2019-04-04 DIAGNOSIS — J019 Acute sinusitis, unspecified: Secondary | ICD-10-CM

## 2019-04-04 MED ORDER — AZITHROMYCIN 250 MG PO TABS: ORAL_TABLET | ORAL | 0 refills | Status: DC

## 2019-04-04 NOTE — Progress Notes (Signed)
We are sorry that you are not feeling well.  Here is how we plan to help!  Based on what you have shared with me it looks like you have sinusitis.  Sinusitis is inflammation and infection in the sinus cavities of the head.  Based on your presentation I believe you most likely have Acute Bacterial Sinusitis.  This is an infection caused by bacteria and is treated with antibiotics. I have prescribed Zpak as directed. You may use an oral decongestant such as Mucinex D or if you have glaucoma or high blood pressure use plain Mucinex. Saline nasal spray help and can safely be used as often as needed for congestion.  If you develop worsening sinus pain, fever or notice severe headache and vision changes, or if symptoms are not better after completion of antibiotic, please schedule an appointment with a health care provider.    Sinus infections are not as easily transmitted as other respiratory infection, however we still recommend that you avoid close contact with loved ones, especially the very young and elderly.  Remember to wash your hands thoroughly throughout the day as this is the number one way to prevent the spread of infection!  Home Care:  Only take medications as instructed by your medical team.  Complete the entire course of an antibiotic.  Do not take these medications with alcohol.  A steam or ultrasonic humidifier can help congestion.  You can place a towel over your head and breathe in the steam from hot water coming from a faucet.  Avoid close contacts especially the very young and the elderly.  Cover your mouth when you cough or sneeze.  Always remember to wash your hands.  Get Help Right Away If:  You develop worsening fever or sinus pain.  You develop a severe head ache or visual changes.  Your symptoms persist after you have completed your treatment plan.  Make sure you  Understand these instructions.  Will watch your condition.  Will get help right away if you are  not doing well or get worse.  Your e-visit answers were reviewed by a board certified advanced clinical practitioner to complete your personal care plan.  Depending on the condition, your plan could have included both over the counter or prescription medications.  If there is a problem please reply  once you have received a response from your provider.  Your safety is important to [place].  If you have drug allergies check your prescription carefully.    You can use MyChart to ask questions about today's visit, request a non-urgent call back, or ask for a work or school excuse for 24 hours related to this e-Visit. If it has been greater than 24 hours you will need to follow up with your provider, or enter a new e-Visit to address those concerns.  You will get an e-mail in the next two days asking about your experience.  I hope that your e-visit has been valuable and will speed your recovery. Thank you for using e-visits.   Greater than 5 minutes, yet less than 10 minutes of time have been spent researching, coordinating, and implementing care for this patient today.  Thank you for the details you included in the comment boxes. Those details are very helpful in determining the best course of treatment for you and help [place] to provide the best care.

## 2019-04-24 ENCOUNTER — Other Ambulatory Visit: Payer: BLUE CROSS/BLUE SHIELD

## 2019-04-28 ENCOUNTER — Encounter: Payer: BLUE CROSS/BLUE SHIELD | Admitting: Family Medicine

## 2019-05-09 ENCOUNTER — Other Ambulatory Visit: Payer: Self-pay

## 2019-05-09 DIAGNOSIS — J432 Centrilobular emphysema: Secondary | ICD-10-CM

## 2019-05-09 MED ORDER — ANORO ELLIPTA 62.5-25 MCG/INH IN AEPB: 1.0000 | INHALATION_SPRAY | Freq: Every day | RESPIRATORY_TRACT | 2 refills | Status: DC

## 2019-05-26 ENCOUNTER — Other Ambulatory Visit: Payer: Self-pay | Admitting: Family Medicine

## 2019-05-26 DIAGNOSIS — J301 Allergic rhinitis due to pollen: Secondary | ICD-10-CM

## 2019-05-26 DIAGNOSIS — J3081 Allergic rhinitis due to animal (cat) (dog) hair and dander: Secondary | ICD-10-CM

## 2019-05-26 NOTE — Telephone Encounter (Signed)
Forwarding medication refill request to PCP for review. 

## 2019-08-31 ENCOUNTER — Other Ambulatory Visit: Payer: Self-pay

## 2019-08-31 ENCOUNTER — Ambulatory Visit (INDEPENDENT_AMBULATORY_CARE_PROVIDER_SITE_OTHER): Payer: BLUE CROSS/BLUE SHIELD | Admitting: Family Medicine

## 2019-08-31 ENCOUNTER — Encounter: Payer: Self-pay | Admitting: Family Medicine

## 2019-08-31 DIAGNOSIS — H6983 Other specified disorders of Eustachian tube, bilateral: Secondary | ICD-10-CM | POA: Diagnosis not present

## 2019-08-31 DIAGNOSIS — J441 Chronic obstructive pulmonary disease with (acute) exacerbation: Secondary | ICD-10-CM | POA: Diagnosis not present

## 2019-08-31 DIAGNOSIS — J0100 Acute maxillary sinusitis, unspecified: Secondary | ICD-10-CM

## 2019-08-31 MED ORDER — PREDNISONE 10 MG PO TABS: ORAL_TABLET | ORAL | 0 refills | Status: DC

## 2019-08-31 MED ORDER — DOXYCYCLINE HYCLATE 100 MG PO TABS: 100.0000 mg | ORAL_TABLET | Freq: Two times a day (BID) | ORAL | 0 refills | Status: DC

## 2019-08-31 NOTE — Patient Instructions (Addendum)
Start taking Doxycycline antibiotic 100mg  twice daily for 10 days. Take with full glass of water and stay upright for at least 30 min after taking, may be seated or standing, but should NOT lay down. This is just a safety precaution, if this medicine does not go all the way down throat well it could cause some burning discomfort to throat and esophagus.  ONLY If worse or wheezing or cough - Start prednisone taper if needed  Follow up sooner if not improve  Please schedule a Follow-up Appointment to: Return in about 2 weeks (around 09/14/2019), or if symptoms worsen or fail to improve, for sinus COPD.  If you have any other questions or concerns, please feel free to call the office or send a message through [place]. You may also schedule an earlier appointment if necessary.  Additionally, you may be receiving a survey about your experience at our office within a few days to 1 week by e-mail or mail. We value your feedback.  [name], DO [place]

## 2019-08-31 NOTE — Progress Notes (Signed)
Virtual Visit via Telephone The purpose of this virtual visit is to provide medical care while limiting exposure to the novel coronavirus (COVID19) for both patient and office staff.  Consent was obtained for phone visit:  Yes.   Answered questions that patient had about telehealth interaction:  Yes.   I discussed the limitations, risks, security and privacy concerns of performing an evaluation and management service by telephone. I also discussed with the patient that there may be a patient responsible charge related to this service. The patient expressed understanding and agreed to proceed.  Patient Location: Home Provider Location: [facility]   ---------------------------------------------------------------------- Chief Complaint  Patient presents with  . Sinus Problem    runny nose, nasal congestion, facial pressure,post nasal drainage, wheezing and ear fullness x 3 days    S: Reviewed CMA documentation. I have called patient and gathered additional HPI as follows:  ACUTE COPD EXACERBATION / SINUSITIS Last 03/2019 Azithromycin Nasal drainage, congestion, green mucus Post nasal drainage sore throat Ears stopped up Goal to treat before gets too deep into chest and cause worse bronchitis like in past. She admits now some wheezing recently. Using breathing treatment.  Denies any high risk travel to areas of current concern for COVID19. Denies any known or suspected exposure to person with or possibly with COVID19.   Denies any fevers, chills, sweats, body ache, headache, abdominal pain, diarrhea  -------------------------------------------------------------------------- O: No physical exam performed due to remote telephone encounter.  -------------------------------------------------------------------------- A&P:  Suspected Acute Sinusitis vs developing Acute Exacerbation of COPD Possible initial viral etiology, now persistent symptoms, concern for  potential bacterial infection  Cannot rule out COVID19, offered COVID testing Afebrile  1. Start taking Doxycycline antibiotic 100mg  twice daily for 10 days. Take with full glass of water and stay upright for at least 30 min after taking, may be seated or standing, but should NOT lay down. This is just a safety precaution, if this medicine does not go all the way down throat well it could cause some burning discomfort to throat and esophagus. 2. Prednisone taper 60 to 10mg  over 6 days 3. Use breathing treatments  Return to care sooner if indicated if worsening, future urgent care ED if need consider COVID test / X-ray  Meds ordered this encounter  Medications  . doxycycline (VIBRA-TABS) 100 MG tablet    Sig: Take 1 tablet (100 mg total) by mouth 2 (two) times daily. For 10 days. Take with full glass of water, stay upright 30 min after taking.    Dispense:  20 tablet    Refill:  0  . predniSONE (DELTASONE) 10 MG tablet    Sig: Take 6 tabs with breakfast Day 1, 5 tabs Day 2, 4 tabs Day 3, 3 tabs Day 4, 2 tabs Day 5, 1 tab Day 6.    Dispense:  21 tablet    Refill:  0    OPTIONAL RECOMMENDED self quarantine for patient safety for PREVENTION ONLY. It is not required based on current clinical symptoms. If they were to develop fever or worsening shortness of breath, then emphasis on REQUIRED quarantine for up to 7-14 days that could be resolved if fever free >3 days AND if symptoms improving after 7 days.   If symptoms do not resolve or significantly improve OR if WORSENING - fever / cough - or worsening shortness of breath - then should contact us and seek advice on next steps in treatment at home vs where/when to seek care at Urgent  Care or Hospital ED for further intervention and possible testing if indicated.  Patient verbalizes understanding with the above medical recommendations including the limitation of remote medical advice.  Specific follow-up / call-back criteria were given for  patient to follow-up or seek medical care more urgently if needed.   - Time spent in direct consultation with patient on phone: 7 minutes   [name], [facility] 08/31/2019, 10:53 AM

## 2019-09-12 DIAGNOSIS — J0100 Acute maxillary sinusitis, unspecified: Secondary | ICD-10-CM

## 2019-09-12 MED ORDER — AZITHROMYCIN 250 MG PO TABS: ORAL_TABLET | ORAL | 0 refills | Status: DC

## 2019-10-12 ENCOUNTER — Encounter: Payer: Self-pay | Admitting: Family Medicine

## 2019-10-12 ENCOUNTER — Other Ambulatory Visit: Payer: Self-pay

## 2019-10-12 ENCOUNTER — Ambulatory Visit (INDEPENDENT_AMBULATORY_CARE_PROVIDER_SITE_OTHER): Payer: 59 | Admitting: Family Medicine

## 2019-10-12 DIAGNOSIS — J432 Centrilobular emphysema: Secondary | ICD-10-CM | POA: Diagnosis not present

## 2019-10-12 DIAGNOSIS — J0111 Acute recurrent frontal sinusitis: Secondary | ICD-10-CM

## 2019-10-12 MED ORDER — LEVOFLOXACIN 500 MG PO TABS: 500.0000 mg | ORAL_TABLET | Freq: Every day | ORAL | 0 refills | Status: DC

## 2019-10-12 MED ORDER — ANORO ELLIPTA 62.5-25 MCG/INH IN AEPB: 1.0000 | INHALATION_SPRAY | Freq: Every day | RESPIRATORY_TRACT | 2 refills | Status: DC

## 2019-10-12 MED ORDER — PREDNISONE 50 MG PO TABS: 50.0000 mg | ORAL_TABLET | Freq: Every day | ORAL | 0 refills | Status: DC

## 2019-10-12 NOTE — Progress Notes (Signed)
Virtual Visit via Telephone The purpose of this virtual visit is to provide medical care while limiting exposure to the novel coronavirus (COVID19) for both patient and office staff.  Consent was obtained for phone visit:  Yes.   Answered questions that patient had about telehealth interaction:  Yes.   I discussed the limitations, risks, security and privacy concerns of performing an evaluation and management service by telephone. I also discussed with the patient that there may be a patient responsible charge related to this service. The patient expressed understanding and agreed to proceed.  Patient Location: Home Provider Location: [facility]   ---------------------------------------------------------------------- Chief Complaint  Patient presents with  . Sinus Problem    onset 4 days     S: Reviewed CMA documentation. I have called patient and gathered additional HPI as follows:  ACUTE COPD EXACERBATION / RECURRENT SINUSITIS - Last visit with me 08/31/19, for initial visit for same problem sinusitis and COPD with Doxycycline and Prednisone and also on 09/12/19, treated with Azithromycin zpak after only partial relief, see prior notes for background information. - Interval update with prednisone did help her cough and sputum and breathing but then worse again, she did improve on antibiotics but now back again, said new sick contact family member with sinus infection as well - Today patient reports worsening sinus pressure congestion thicker green and darker drainage, post nasal drainage and into lungs coughing it up at times, without significant dyspnea or wheezing. - Similar to prior COPD / Sinus infection. She is no longer using Anoro was using PRN only, asking about this med.  Denies any known or suspected exposure to person with or possibly with COVID19.  Denies any known or suspected exposure to person with or possibly with COVID19.  Denies any fevers,  chills, sweats, body ache, abdominal pain, diarrhea  -------------------------------------------------------------------------- O: No physical exam performed due to remote telephone encounter.  -------------------------------------------------------------------------- A&P:  Suspected Acute Recurrent Sinusitis Already treated with Doxycycline and azithromycin in past, and prednisone course for COPD Now recurrence, after partial improvement  - Reassuring without high risk symptoms - Afebrile, without dyspnea  1. Start taking Levaquin antibiotic 500mg  daily x 7 days 2. Repeat prednisone burst 50mg  daily x 5 day 3. Re order Anoro daily for maintenance reduce exac  Meds ordered this encounter  Medications  . levofloxacin (LEVAQUIN) 500 MG tablet    Sig: Take 1 tablet (500 mg total) by mouth daily. For 7 days    Dispense:  7 tablet    Refill:  0  . ANORO ELLIPTA 62.5-25 MCG/INH AEPB    Sig: Inhale 1 puff into the lungs daily.    Dispense:  60 each    Refill:  2  . predniSONE (DELTASONE) 50 MG tablet    Sig: Take 1 tablet (50 mg total) by mouth daily with breakfast.    Dispense:  5 tablet    Refill:  0    OPTIONAL RECOMMENDED self quarantine for patient safety for PREVENTION ONLY. It is not required based on current clinical symptoms. If they were to develop fever or worsening shortness of breath, then emphasis on REQUIRED quarantine for up to 7-14 days that could be resolved if fever free >3 days AND if symptoms improving after 7 days.   If symptoms do not resolve or significantly improve OR if WORSENING - fever / cough - or worsening shortness of breath - then should contact us and seek advice on next steps in treatment at home  vs where/when to seek care at Urgent Care or Hospital ED for further intervention and possible testing if indicated.  Patient verbalizes understanding with the above medical recommendations including the limitation of remote medical advice.  Specific  follow-up / call-back criteria were given for patient to follow-up or seek medical care more urgently if needed.   - Time spent in direct consultation with patient on phone: 8 minutes  [name], [facility] 10/12/2019, 2:29 PM

## 2019-10-12 NOTE — Patient Instructions (Addendum)
Levaquin antibiotic 500 daily x 7 days Prednisone steroid 50mg  daily x 5 days Anoro daily inhalation for maintenance of COPD reduce flares If not improving still may need to consult specialist  Please schedule a Follow-up Appointment to: Return in about 2 weeks (around 10/26/2019), or if symptoms worsen or fail to improve, for sinusitis.  If you have any other questions or concerns, please feel free to call the office or send a message through [place]. You may also schedule an earlier appointment if necessary.  Additionally, you may be receiving a survey about your experience at our office within a few days to 1 week by e-mail or mail. We value your feedback.  [name], DO [place]

## 2019-10-15 ENCOUNTER — Other Ambulatory Visit: Payer: Self-pay | Admitting: Family Medicine

## 2019-10-15 DIAGNOSIS — J301 Allergic rhinitis due to pollen: Secondary | ICD-10-CM

## 2019-10-15 DIAGNOSIS — J3081 Allergic rhinitis due to animal (cat) (dog) hair and dander: Secondary | ICD-10-CM

## 2019-10-15 DIAGNOSIS — F411 Generalized anxiety disorder: Secondary | ICD-10-CM

## 2019-12-05 ENCOUNTER — Telehealth: Payer: Self-pay | Admitting: Family Medicine

## 2019-12-05 DIAGNOSIS — E559 Vitamin D deficiency, unspecified: Secondary | ICD-10-CM

## 2019-12-05 DIAGNOSIS — E7800 Pure hypercholesterolemia, unspecified: Secondary | ICD-10-CM

## 2019-12-05 DIAGNOSIS — Z0000 Encounter for general adult medical examination without abnormal findings: Secondary | ICD-10-CM

## 2019-12-05 DIAGNOSIS — R7309 Other abnormal glucose: Secondary | ICD-10-CM

## 2019-12-05 NOTE — Telephone Encounter (Signed)
Signed lab orders  [name], [facility] 12/05/2019, 10:25 AM

## 2019-12-05 NOTE — Addendum Note (Signed)
Addended by: Olin Hauser on: 12/05/2019 10:29 AM   Modules accepted: Orders

## 2019-12-26 ENCOUNTER — Other Ambulatory Visit: Payer: Self-pay

## 2019-12-26 ENCOUNTER — Other Ambulatory Visit: Payer: 59

## 2019-12-26 DIAGNOSIS — Z0000 Encounter for general adult medical examination without abnormal findings: Secondary | ICD-10-CM

## 2019-12-26 DIAGNOSIS — E7800 Pure hypercholesterolemia, unspecified: Secondary | ICD-10-CM

## 2019-12-26 DIAGNOSIS — E559 Vitamin D deficiency, unspecified: Secondary | ICD-10-CM

## 2019-12-26 DIAGNOSIS — R7309 Other abnormal glucose: Secondary | ICD-10-CM

## 2019-12-27 LAB — COMPLETE METABOLIC PANEL WITH GFR
AG Ratio: 1.9 (calc) (ref 1.0–2.5)
ALT: 8 U/L (ref 6–29)
AST: 12 U/L (ref 10–35)
Albumin: 4.4 g/dL (ref 3.6–5.1)
Alkaline phosphatase (APISO): 86 U/L (ref 37–153)
BUN: 15 mg/dL (ref 7–25)
CO2: 27 mmol/L (ref 20–32)
Calcium: 10 mg/dL (ref 8.6–10.4)
Chloride: 107 mmol/L (ref 98–110)
Creat: 0.73 mg/dL (ref 0.50–1.05)
GFR, Est African American: 111 mL/min/{1.73_m2} (ref >=60)
GFR, Est Non African American: 96 mL/min/{1.73_m2} (ref >=60)
Globulin: 2.3 g/dL (calc) (ref 1.9–3.7)
Glucose, Bld: 118 mg/dL — ABNORMAL HIGH (ref 65–99)
Potassium: 4.9 mmol/L (ref 3.5–5.3)
Sodium: 141 mmol/L (ref 135–146)
Total Bilirubin: 0.5 mg/dL (ref 0.2–1.2)
Total Protein: 6.7 g/dL (ref 6.1–8.1)

## 2019-12-27 LAB — CBC WITH DIFFERENTIAL/PLATELET
Absolute Monocytes: 435 cells/uL (ref 200–950)
Basophils Absolute: 39 cells/uL (ref 0–200)
Basophils Relative: 0.7 %
Eosinophils Absolute: 231 cells/uL (ref 15–500)
Eosinophils Relative: 4.2 %
HCT: 43.2 % (ref 35.0–45.0)
Hemoglobin: 14 g/dL (ref 11.7–15.5)
Lymphs Abs: 1997 cells/uL (ref 850–3900)
MCH: 28.9 pg (ref 27.0–33.0)
MCHC: 32.4 g/dL (ref 32.0–36.0)
MCV: 89.3 fL (ref 80.0–100.0)
MPV: 8.8 fL (ref 7.5–12.5)
Monocytes Relative: 7.9 %
Neutro Abs: 2800 cells/uL (ref 1500–7800)
Neutrophils Relative %: 50.9 %
Platelets: 313 10*3/uL (ref 140–400)
RBC: 4.84 10*6/uL (ref 3.80–5.10)
RDW: 12 % (ref 11.0–15.0)
Total Lymphocyte: 36.3 %
WBC: 5.5 10*3/uL (ref 3.8–10.8)

## 2019-12-27 LAB — HEMOGLOBIN A1C
Hgb A1c MFr Bld: 6.2 % of total Hgb — ABNORMAL HIGH (ref <5.7)
Mean Plasma Glucose: 131 (calc)
eAG (mmol/L): 7.3 (calc)

## 2019-12-27 LAB — LIPID PANEL
Cholesterol: 193 mg/dL (ref <200)
HDL: 60 mg/dL (ref >=50)
LDL Cholesterol (Calc): 112 mg/dL (calc) — ABNORMAL HIGH
Non-HDL Cholesterol (Calc): 133 mg/dL (calc) — ABNORMAL HIGH (ref <130)
Total CHOL/HDL Ratio: 3.2 (calc) (ref <5.0)
Triglycerides: 100 mg/dL (ref <150)

## 2019-12-27 LAB — VITAMIN D 25 HYDROXY (VIT D DEFICIENCY, FRACTURES): Vit D, 25-Hydroxy: 20 ng/mL — ABNORMAL LOW (ref 30–100)

## 2019-12-27 LAB — TSH: TSH: 3.44 mIU/L

## 2019-12-29 ENCOUNTER — Ambulatory Visit (INDEPENDENT_AMBULATORY_CARE_PROVIDER_SITE_OTHER): Payer: 59 | Admitting: Family Medicine

## 2019-12-29 ENCOUNTER — Other Ambulatory Visit: Payer: Self-pay

## 2019-12-29 ENCOUNTER — Encounter: Payer: Self-pay | Admitting: Family Medicine

## 2019-12-29 VITALS — BP 128/43 | HR 103 | Temp 97.3°F | Resp 16 | Ht 67.0 in | Wt 131.6 lb

## 2019-12-29 DIAGNOSIS — Z0000 Encounter for general adult medical examination without abnormal findings: Secondary | ICD-10-CM

## 2019-12-29 DIAGNOSIS — Z1231 Encounter for screening mammogram for malignant neoplasm of breast: Secondary | ICD-10-CM

## 2019-12-29 DIAGNOSIS — K219 Gastro-esophageal reflux disease without esophagitis: Secondary | ICD-10-CM

## 2019-12-29 DIAGNOSIS — J432 Centrilobular emphysema: Secondary | ICD-10-CM | POA: Diagnosis not present

## 2019-12-29 DIAGNOSIS — F411 Generalized anxiety disorder: Secondary | ICD-10-CM

## 2019-12-29 DIAGNOSIS — R7309 Other abnormal glucose: Secondary | ICD-10-CM

## 2019-12-29 DIAGNOSIS — J301 Allergic rhinitis due to pollen: Secondary | ICD-10-CM

## 2019-12-29 MED ORDER — FEXOFENADINE HCL 180 MG PO TABS: 180.0000 mg | ORAL_TABLET | Freq: Every day | ORAL | 3 refills | Status: DC

## 2019-12-29 MED ORDER — SERTRALINE HCL 25 MG PO TABS: 25.0000 mg | ORAL_TABLET | Freq: Every day | ORAL | 3 refills | Status: DC

## 2019-12-29 NOTE — Progress Notes (Signed)
Subjective:    Patient ID: Lori Davies, female    DOB: 1970-11-06, 49 y.o.   MRN: [id number]  Lori Davies is a 49 y.o. female presenting on 12/29/2019 for Annual Exam   HPI   Here for Annual Physical and Lab Review.  Today she is doing well, dealing with some sinus/allergy symptoms.  Vitamin D Deficiency - mild low at 20 on last lab. She will start OTC  Elevated A1c No prior elevated A1c result. Several readings of impaired fasting glucose. Last 118. A1c now currently at 6.2 Diet - admits poor diet often, junk foods, pasta, starches  Allergies Recent worsening sinus congestion and allergies, onset past 2-3 days worse with pollen exposure. Taking Singulair nightly. Used to take Allegra asking for generic rx  Centrilobular Emphysema (COPD) Chronic problem. Currently without wheezing or flare up.  Anxiety, Chronic Reports that she denies any history of depression, primarily symptoms mostly with anxiety, prior wellbutrin but failed this, made her sleepy. Was on Zoloft in past, then she was switched to Lexapro at some point and has completed this one, now requesting to restart Zoloft seemed to work better  Health Maintenance:  Colon CA Screening: Never had colonoscopy or other colon cancer screening. Asymptomatic. No fam history. She said cologuard is covered but has to pay deductible. She will check cost of colonoscopy  Breast CA Screening: Due for mammogram screening. Last mammogram result negative 2006 done at [facility]. No prior history abnormal mammogram. No known family history of breast cancer. Currently asymptomatic.  Due for Pap Smear from [facility] Dr Leafy Ro she will call and schedule   Depression screen [facility] 2/9 12/29/2019 08/31/2019 10/25/2018  Decreased Interest 0 0 0  Down, Depressed, Hopeless 0 0 0  PHQ - 2 Score 0 0 0  Altered sleeping - 0 0  Tired, decreased energy - 0 0  Change in appetite - 0 0  Feeling bad or failure about yourself  - 0  0  Trouble concentrating - 0 0  Moving slowly or fidgety/restless - 0 0  Suicidal thoughts - 0 0  PHQ-9 Score - 0 0  Difficult doing work/chores - Not difficult at all Not difficult at all   GAD 7 : Generalized Anxiety Score 09/19/2018 06/13/2018 03/28/2018 02/11/2017  Nervous, Anxious, on Edge 0 2 1 1   Control/stop worrying 0 2 1 0  Worry too much - different things 0 2 0 0  Trouble relaxing 0 2 1 0  Restless 0 1 0 0  Easily annoyed or irritable 0 2 1 0  Afraid - awful might happen 0 1 1 0  Total GAD 7 Score 0 12 5 1   Anxiety Difficulty Not difficult at all Not difficult at all Somewhat difficult Not difficult at all     History reviewed. No pertinent past medical history. History reviewed. No pertinent surgical history. Social History   Socioeconomic History  . Marital status: Married    Spouse name: Not on file  . Number of children: Not on file  . Years of education: Not on file  . Highest education level: Not on file  Occupational History  . Not on file  Tobacco Use  . Smoking status: Former Smoker    Packs/day: 0.50    Years: 20.00    Pack years: 10.00    Types: Cigarettes    Quit date: 03/21/2018    Years since quitting: 1.7  . Smokeless tobacco: Former Systems developer  . Tobacco comment: Few  years had breaks in smoking  Substance and Sexual Activity  . Alcohol use: No    Alcohol/week: 0.0 standard drinks  . Drug use: No  . Sexual activity: Yes    Birth control/protection: Injection  Other Topics Concern  . Not on file  Social History Narrative  . Not on file   Social Determinants of Health   Financial Resource Strain:   . Difficulty of Paying Living Expenses:   Food Insecurity:   . Worried About Charity fundraiser in the Last Year:   . Arboriculturist in the Last Year:   Transportation Needs:   . Film/video editor (Medical):   Marland Kitchen Lack of Transportation (Non-Medical):   Physical Activity:   . Days of Exercise per Week:   . Minutes of Exercise per Session:     Stress:   . Feeling of Stress :   Social Connections:   . Frequency of Communication with Friends and Family:   . Frequency of Social Gatherings with Friends and Family:   . Attends Religious Services:   . Active Member of Clubs or Organizations:   . Attends Archivist Meetings:   Marland Kitchen Marital Status:   Intimate Partner Violence:   . Fear of Current or Ex-Partner:   . Emotionally Abused:   Marland Kitchen Physically Abused:   . Sexually Abused:    Family History  Problem Relation Age of Onset  . Heart disease Mother   . Lung disease Mother   . Heart disease Father   . Hypertension Father   . Autism Son   . Diabetes Maternal Aunt   . Breast cancer Neg Hx   . Colon cancer Neg Hx    Current Outpatient Medications on File Prior to Visit  Medication Sig  . ANORO ELLIPTA 62.5-25 MCG/INH AEPB Inhale 1 puff into the lungs daily.  . montelukast (SINGULAIR) 10 MG tablet TAKE 1 TABLET BY MOUTH EVERYDAY AT BEDTIME   No current facility-administered medications on file prior to visit.    Review of Systems  Constitutional: Negative for activity change, appetite change, chills, diaphoresis, fatigue and fever.  HENT: Positive for congestion, postnasal drip and sinus pressure. Negative for hearing loss.   Eyes: Negative for visual disturbance.  Respiratory: Negative for cough, chest tightness, shortness of breath and wheezing.   Cardiovascular: Negative for chest pain, palpitations and leg swelling.  Gastrointestinal: Negative for abdominal pain, anal bleeding, blood in stool, constipation, diarrhea, nausea and vomiting.  Genitourinary: Negative for dysuria, frequency and hematuria.  Musculoskeletal: Negative for arthralgias, back pain and neck pain.  Skin: Negative for rash.  Allergic/Immunologic: Positive for environmental allergies.  Neurological: Negative for dizziness, weakness, light-headedness, numbness and headaches.  Hematological: Negative for adenopathy.  Psychiatric/Behavioral:  Negative for behavioral problems, dysphoric mood and sleep disturbance. The patient is nervous/anxious.    Per HPI unless specifically indicated above      Objective:    BP (!) 128/43   Pulse (!) 103   Temp (!) 97.3 F (36.3 C) (Temporal)   Resp 16   Ht 5\' 7"  (1.702 m)   Wt 131 lb 9.6 oz (59.7 kg)   LMP  (LMP Unknown)   BMI 20.61 kg/m   Wt Readings from Last 3 Encounters:  12/29/19 131 lb 9.6 oz (59.7 kg)  10/25/18 136 lb (61.7 kg)  09/19/18 127 lb (57.6 kg)    Physical Exam Vitals and nursing note reviewed.  Constitutional:      General: She is not  in acute distress.    Appearance: She is well-developed. She is not diaphoretic.     Comments: Well-appearing, comfortable, cooperative  HENT:     Head: Normocephalic and atraumatic.     Right Ear: Tympanic membrane, ear canal and external ear normal.     Left Ear: Tympanic membrane, ear canal and external ear normal.  Eyes:     General:        Right eye: No discharge.        Left eye: No discharge.     Conjunctiva/sclera: Conjunctivae normal.     Pupils: Pupils are equal, round, and reactive to light.  Neck:     Thyroid: No thyromegaly.  Cardiovascular:     Rate and Rhythm: Normal rate and regular rhythm.     Heart sounds: Normal heart sounds. No murmur.  Pulmonary:     Effort: Pulmonary effort is normal. No respiratory distress.     Breath sounds: No wheezing or rales.     Comments: Some coarse breath sounds with reduced air movement. Occasional cough. No obvious wheezing. Abdominal:     General: Bowel sounds are normal. There is no distension.     Palpations: Abdomen is soft. There is no mass.     Tenderness: There is no abdominal tenderness.  Musculoskeletal:        General: No tenderness. Normal range of motion.     Cervical back: Normal range of motion and neck supple.     Comments: Upper / Lower Extremities: - Normal muscle tone, strength bilateral upper extremities 5/5, lower extremities 5/5    Lymphadenopathy:     Cervical: No cervical adenopathy.  Skin:    General: Skin is warm and dry.     Findings: No erythema or rash.  Neurological:     Mental Status: She is alert and oriented to person, place, and time.     Comments: Distal sensation intact to light touch all extremities  Psychiatric:        Behavior: Behavior normal.     Comments: Well groomed, good eye contact, normal speech and thoughts    Results for orders placed or performed in visit on 12/26/19  VITAMIN D 25 Hydroxy (Vit-D Deficiency, Fractures)  Result Value Ref Range   Vit D, 25-Hydroxy 20 (L) 30 - 100 ng/mL  TSH  Result Value Ref Range   TSH 3.44 mIU/L  COMPLETE METABOLIC PANEL WITH GFR  Result Value Ref Range   Glucose, Bld 118 (H) 65 - 99 mg/dL   BUN 15 7 - 25 mg/dL   Creat 0.73 0.50 - 1.05 mg/dL   GFR, Est Non African American 96 > OR = 60 mL/min/1.77m2   GFR, Est African American 111 > OR = 60 mL/min/1.68m2   BUN/Creatinine Ratio NOT APPLICABLE 6 - 22 (calc)   Sodium 141 135 - 146 mmol/L   Potassium 4.9 3.5 - 5.3 mmol/L   Chloride 107 98 - 110 mmol/L   CO2 27 20 - 32 mmol/L   Calcium 10.0 8.6 - 10.4 mg/dL   Total Protein 6.7 6.1 - 8.1 g/dL   Albumin 4.4 3.6 - 5.1 g/dL   Globulin 2.3 1.9 - 3.7 g/dL (calc)   AG Ratio 1.9 1.0 - 2.5 (calc)   Total Bilirubin 0.5 0.2 - 1.2 mg/dL   Alkaline phosphatase (APISO) 86 37 - 153 U/L   AST 12 10 - 35 U/L   ALT 8 6 - 29 U/L  Hemoglobin A1c  Result Value Ref Range  Hgb A1c MFr Bld 6.2 (H) <5.7 % of total Hgb   Mean Plasma Glucose 131 (calc)   eAG (mmol/L) 7.3 (calc)  Lipid panel  Result Value Ref Range   Cholesterol 193 <200 mg/dL   HDL 60 > OR = 50 mg/dL   Triglycerides 100 <150 mg/dL   LDL Cholesterol (Calc) 112 (H) mg/dL (calc)   Total CHOL/HDL Ratio 3.2 <5.0 (calc)   Non-HDL Cholesterol (Calc) 133 (H) <130 mg/dL (calc)  CBC with Differential/Platelet  Result Value Ref Range   WBC 5.5 3.8 - 10.8 Thousand/uL   RBC 4.84 3.80 - 5.10 Million/uL    Hemoglobin 14.0 11.7 - 15.5 g/dL   HCT 43.2 35.0 - 45.0 %   MCV 89.3 80.0 - 100.0 fL   MCH 28.9 27.0 - 33.0 pg   MCHC 32.4 32.0 - 36.0 g/dL   RDW 12.0 11.0 - 15.0 %   Platelets 313 140 - 400 Thousand/uL   MPV 8.8 7.5 - 12.5 fL   Neutro Abs 2,800 1,500 - 7,800 cells/uL   Lymphs Abs 1,997 850 - 3,900 cells/uL   Absolute Monocytes 435 200 - 950 cells/uL   Eosinophils Absolute 231 15 - 500 cells/uL   Basophils Absolute 39 0 - 200 cells/uL   Neutrophils Relative % 50.9 %   Total Lymphocyte 36.3 %   Monocytes Relative 7.9 %   Eosinophils Relative 4.2 %   Basophils Relative 0.7 %      Assessment & Plan:   Problem List Items Addressed This Visit    GERD (gastroesophageal reflux disease)   GAD (generalized anxiety disorder)    Recent worse anxiety Uncontrolled off lexapro SSRI, seemed limited benefit RESTART SSRI with Zoloft 25mg  daily, instead of lexapro      Relevant Medications   sertraline (ZOLOFT) 25 MG tablet   Elevated hemoglobin A1c    Elevated A1c 6.2  Plan:  1. Not on any therapy currently  2. Encourage improved lifestyle - low carb, low sugar diet, reduce portion size, continue improving regular exercise  Handout given on low carb diet      Centrilobular emphysema (HCC)    Chronic problem of mild COPD vs asthmatic obstructive airway problem Former smoker On Anoro regularly Prior flare ups On Allergy regimen  No acute COPD exac right now Future consider pulmonology if indicated      Relevant Medications   fexofenadine (ALLEGRA) 180 MG tablet   Allergic rhinitis    Restart Allegra 180mg  daily generic rx Use nasal sprays if need, use singulair currently F/u if not improve within 1 week consider antibiotic course if indicated      Relevant Medications   fexofenadine (ALLEGRA) 180 MG tablet    Other Visit Diagnoses    Annual physical exam    -  Primary   Encounter for screening mammogram for malignant neoplasm of breast       Relevant Orders   MM  DIGITAL SCREENING BILATERAL      Updated Health Maintenance information - Ordered Mammogram, she will call to schedule at [place] Screening: Colonoscopy vs Cologuard, due to high deductible plan she said will be high cost, so she will likely defer 1 year until ins change. She can check on FIT testing from insurance. Reviewed recent lab results with patient Encouraged improvement to lifestyle with diet and exercise -Goal maintain weight    Meds ordered this encounter  Medications  . fexofenadine (ALLEGRA) 180 MG tablet    Sig: Take 1  tablet (180 mg total) by mouth daily.    Dispense:  90 tablet    Refill:  3  . sertraline (ZOLOFT) 25 MG tablet    Sig: Take 1 tablet (25 mg total) by mouth daily.    Dispense:  90 tablet    Refill:  3      Follow up plan: Return in about 6 months (around 06/29/2020) for 6 month Pre-DM A1c, COPD.  [name], [facility] 12/29/2019, 9:13 AM

## 2019-12-29 NOTE — Assessment & Plan Note (Signed)
Elevated A1c 6.2  Plan:  1. Not on any therapy currently  2. Encourage improved lifestyle - low carb, low sugar diet, reduce portion size, continue improving regular exercise  Handout given on low carb diet

## 2019-12-29 NOTE — Assessment & Plan Note (Signed)
Chronic problem of mild COPD vs asthmatic obstructive airway problem Former smoker On Anoro regularly Prior flare ups On Allergy regimen  No acute COPD exac right now Future consider pulmonology if indicated

## 2019-12-29 NOTE — Assessment & Plan Note (Signed)
Recent worse anxiety Uncontrolled off lexapro SSRI, seemed limited benefit RESTART SSRI with Zoloft 25mg  daily, instead of lexapro

## 2019-12-29 NOTE — Assessment & Plan Note (Signed)
Restart Allegra 180mg  daily generic rx Use nasal sprays if need, use singulair currently F/u if not improve within 1 week consider antibiotic course if indicated

## 2019-12-29 NOTE — Patient Instructions (Addendum)
Thank you for coming to the office today.   Call Dr Primitivo Gauze OB GYN for next Pap Smear, have them send [place] a copy.  Recent Labs    12/26/19 0824  HGBA1C 6.2*   Keep up with the goal to reduce starches / carbs / sugars in diet. Try to stay active as well.  We can follow-up on this A1c sugar reading in future within 6 months.  Vitamin D mildly low Start OTC Vitamin D3 5,000 unit daily for 12 weeks. Then down to Vitamin D3 1,000 to 2,000 unit daily  For Mammogram screening for breast cancer   Call the [place] below anytime to schedule your own appointment now that order has been placed.  [facility], [place] [address] Phone: [phone]    Colon Cancer Screening: - For all adults age 59+ routine colon cancer screening is highly recommended.     - Recent guidelines from [place] recommend starting age of 34 - Early detection of colon cancer is important, because often there are no warning signs or symptoms, also if found early usually it can be cured. Late stage is hard to treat.  - If you are not interested in Colonoscopy screening (if done and normal you could be cleared for 5 to 10 years until next due), then Cologuard is an excellent alternative for screening test for Colon Cancer. It is highly sensitive for detecting DNA of colon cancer from even the earliest stages. Also, there is NO bowel prep required. - If Cologuard is NEGATIVE, then it is good for 3 years before next due - If Cologuard is POSITIVE, then it is strongly advised to get a Colonoscopy, which allows the GI doctor to locate the source of the cancer or polyp (even very early stage) and treat it by removing it. ------------------------- If you would like to proceed with Cologuard (stool DNA test) - FIRST, call your insurance company and tell them you want to check cost of Cologuard tell them CPT Code [id number] (it  may be completely covered and you could get for no cost, OR max cost without any coverage is about $600). Also, keep in mind if you do NOT open the kit, and decide not to do the test, you will NOT be charged, you should contact the company if you decide not to do the test. - If you want to proceed, you can notify us (phone message, [place], or at next visit) and we will order it for you. The test kit will be delivered to you house within about 1 week. Follow instructions to collect sample, you may call the company for any help or questions, 24/7 telephone support at [phone].  OR Contact them about Colonoscopy to check cost and coverage.  Or as them about a Fecal Immunochemical Test - FIT test for colon screening.  Please schedule a Follow-up Appointment to: Return in about 6 months (around 06/29/2020) for 6 month Pre-DM A1c, COPD.  If you have any other questions or concerns, please feel free to call the office or send a message through [place]. You may also schedule an earlier appointment if necessary.  Additionally, you may be receiving a survey about your experience at our office within a few days to 1 week by e-mail or mail. We value your feedback.  [name], DO [place]

## 2019-12-31 DIAGNOSIS — J0111 Acute recurrent frontal sinusitis: Secondary | ICD-10-CM

## 2020-01-01 MED ORDER — AMOXICILLIN 500 MG PO CAPS: 500.0000 mg | ORAL_CAPSULE | Freq: Two times a day (BID) | ORAL | 0 refills | Status: DC

## 2020-03-06 ENCOUNTER — Telehealth (INDEPENDENT_AMBULATORY_CARE_PROVIDER_SITE_OTHER): Payer: 59 | Admitting: Family Medicine

## 2020-03-06 ENCOUNTER — Encounter: Payer: Self-pay | Admitting: Family Medicine

## 2020-03-06 ENCOUNTER — Other Ambulatory Visit: Payer: Self-pay

## 2020-03-06 DIAGNOSIS — J019 Acute sinusitis, unspecified: Secondary | ICD-10-CM

## 2020-03-06 MED ORDER — IPRATROPIUM BROMIDE 0.06 % NA SOLN: 2.0000 | Freq: Four times a day (QID) | NASAL | 0 refills | Status: DC

## 2020-03-06 NOTE — Patient Instructions (Addendum)
1. It sounds like you have persistent Sinus Congestion or "Rhinosinusitis" - I do not think that this is a Bacterial Sinus Infection. Usually these are caused by Viruses or Allergies, and will run it's course in about 7 to 10 days. - No antibiotics are needed - Continue Allegra Singulair Start Atrovent nasal spray decongestant 2 sprays in each nostril up to 4 times daily for 7 days  - Recommend using Nasal Saline spray multiple times a day to help flush out congestion and clear sinuses - Improve hydration by drinking plenty of clear fluids (water, gatorade) to reduce secretions and thin congestion - Congestion draining down throat can cause irritation. May try warm herbal tea with honey, cough drops - Can take Tylenol or Ibuprofen as needed for fevers - May continue over the counter cold medicine as you are, I would not use any decongestant or mucinex longer than 7 days.   Please schedule a Follow-up Appointment to: Return in about 1 week (around 03/13/2020), or if symptoms worsen or fail to improve, for sinusitis.  If you have any other questions or concerns, please feel free to call the office or send a message through [place]. You may also schedule an earlier appointment if necessary.  Additionally, you may be receiving a survey about your experience at our office within a few days to 1 week by e-mail or mail. We value your feedback.  [name], DO [place]

## 2020-03-06 NOTE — Progress Notes (Signed)
Subjective:    Patient ID: Lori Davies, female    DOB: July 17, 1971, 49 y.o.   MRN: [id number]  Lori Davies is a 49 y.o. female presenting on 03/06/2020 for Sinusitis (stuffy nose, ear pain, sore throat, nasal drainge onset yesterday denies fever)  Virtual / Telehealth Encounter - Video Visit via MyChart The purpose of this virtual visit is to provide medical care while limiting exposure to the novel coronavirus (COVID19) for both patient and office staff.  Consent was obtained for remote visit:  Yes.   Answered questions that patient had about telehealth interaction:  Yes.   I discussed the limitations, risks, security and privacy concerns of performing an evaluation and management service by video/telephone. I also discussed with the patient that there may be a patient responsible charge related to this service. The patient expressed understanding and agreed to proceed.  Patient Location: [place] Provider Location: [facility] (Office)   HPI   RECURRENT SINUSITIS Allergies Seasonal - Last visit with me 09/2019, for visit for sinusitis COPD flare treated with levaquin and prednisone given severity at that time, see prior notes for background information. - Interval update with overall has improved in past several months, back in 12/2019 started on Fexofenadine allegra and Singulair with improvement for allergies - Today patient reports now recent sick contacts with URI sinus infection. She has had onset recently in past day with sinus congestion pressure drainage, and sore throat from drainage and starting to affect her chest congestion Known history of COPD - Denies fever, chills, myalgia nausea vomiting shortness of breath chest pain    Health Maintenance: Due for COVID19 vaccine.  Depression screen [facility] 2/9 12/29/2019 08/31/2019 10/25/2018  Decreased Interest 0 0 0  Down, Depressed, Hopeless 0 0 0  PHQ - 2 Score 0 0 0  Altered sleeping - 0 0  Tired, decreased  energy - 0 0  Change in appetite - 0 0  Feeling bad or failure about yourself  - 0 0  Trouble concentrating - 0 0  Moving slowly or fidgety/restless - 0 0  Suicidal thoughts - 0 0  PHQ-9 Score - 0 0  Difficult doing work/chores - Not difficult at all Not difficult at all    Social History   Tobacco Use  . Smoking status: Former Smoker    Packs/day: 0.50    Years: 20.00    Pack years: 10.00    Types: Cigarettes    Quit date: 03/21/2018    Years since quitting: 1.9  . Smokeless tobacco: Former Systems developer  . Tobacco comment: Few years had breaks in smoking  Vaping Use  . Vaping Use: Never used  Substance Use Topics  . Alcohol use: No    Alcohol/week: 0.0 standard drinks  . Drug use: No    Review of Systems Per HPI unless specifically indicated above     Objective:    LMP  (LMP Unknown)   Wt Readings from Last 3 Encounters:  12/29/19 131 lb 9.6 oz (59.7 kg)  10/25/18 136 lb (61.7 kg)  09/19/18 127 lb (57.6 kg)    Physical Exam   Note examination was completely remotely via video observation objective data only  Gen - well-appearing, no acute distress or apparent pain, comfortable HEENT - eyes appear clear without discharge or redness Heart/Lungs - cannot examine virtually - observed no evidence of coughing or labored breathing. Skin - face visible today- no rash Neuro - awake, alert, oriented Psych - not anxious appearing  Results for orders placed or performed in visit on 12/26/19  VITAMIN D 25 Hydroxy (Vit-D Deficiency, Fractures)  Result Value Ref Range   Vit D, 25-Hydroxy 20 (L) 30 - 100 ng/mL  TSH  Result Value Ref Range   TSH 3.44 mIU/L  COMPLETE METABOLIC PANEL WITH GFR  Result Value Ref Range   Glucose, Bld 118 (H) 65 - 99 mg/dL   BUN 15 7 - 25 mg/dL   Creat 0.73 0.50 - 1.05 mg/dL   GFR, Est Non African American 96 > OR = 60 mL/min/1.64m2   GFR, Est African American 111 > OR = 60 mL/min/1.65m2   BUN/Creatinine Ratio NOT APPLICABLE 6 - 22 (calc)    Sodium 141 135 - 146 mmol/L   Potassium 4.9 3.5 - 5.3 mmol/L   Chloride 107 98 - 110 mmol/L   CO2 27 20 - 32 mmol/L   Calcium 10.0 8.6 - 10.4 mg/dL   Total Protein 6.7 6.1 - 8.1 g/dL   Albumin 4.4 3.6 - 5.1 g/dL   Globulin 2.3 1.9 - 3.7 g/dL (calc)   AG Ratio 1.9 1.0 - 2.5 (calc)   Total Bilirubin 0.5 0.2 - 1.2 mg/dL   Alkaline phosphatase (APISO) 86 37 - 153 U/L   AST 12 10 - 35 U/L   ALT 8 6 - 29 U/L  Hemoglobin A1c  Result Value Ref Range   Hgb A1c MFr Bld 6.2 (H) <5.7 % of total Hgb   Mean Plasma Glucose 131 (calc)   eAG (mmol/L) 7.3 (calc)  Lipid panel  Result Value Ref Range   Cholesterol 193 <200 mg/dL   HDL 60 > OR = 50 mg/dL   Triglycerides 100 <150 mg/dL   LDL Cholesterol (Calc) 112 (H) mg/dL (calc)   Total CHOL/HDL Ratio 3.2 <5.0 (calc)   Non-HDL Cholesterol (Calc) 133 (H) <130 mg/dL (calc)  CBC with Differential/Platelet  Result Value Ref Range   WBC 5.5 3.8 - 10.8 Thousand/uL   RBC 4.84 3.80 - 5.10 Million/uL   Hemoglobin 14.0 11.7 - 15.5 g/dL   HCT 43.2 35 - 45 %   MCV 89.3 80.0 - 100.0 fL   MCH 28.9 27.0 - 33.0 pg   MCHC 32.4 32.0 - 36.0 g/dL   RDW 12.0 11.0 - 15.0 %   Platelets 313 140 - 400 Thousand/uL   MPV 8.8 7.5 - 12.5 fL   Neutro Abs 2,800 1,500 - 7,800 cells/uL   Lymphs Abs 1,997 850 - 3,900 cells/uL   Absolute Monocytes 435 200 - 950 cells/uL   Eosinophils Absolute 231 15 - 500 cells/uL   Basophils Absolute 39 0 - 200 cells/uL   Neutrophils Relative % 50.9 %   Total Lymphocyte 36.3 %   Monocytes Relative 7.9 %   Eosinophils Relative 4.2 %   Basophils Relative 0.7 %      Assessment & Plan:   Problem List Items Addressed This Visit    None    Visit Diagnoses    Acute rhinosinusitis    -  Primary   Relevant Medications   ipratropium (ATROVENT) 0.06 % nasal spray      Consistent with acute rhinosinusitis, likely initially viral URI vs allergic rhinitis component without evidence of acute bacterial infection. Sick contacts viral URI  sinusitis  Plan: 1. Reassurance, likely self-limited - no indication for antibiotics at this time 2. Continue Allegra, Singulair 3. Continue Anoro for COPD 4. START Atrovent nasal spray decongestant 2 sprays in each nostril up to 4 times daily  for 7 days 5. May use OTC Mucinex, sudafed decongestant PRN   Return criteria reviewed - may message f/u within 48-72 hours consider trial of antibiotics if concern for sinusitis or possible progression to COPD.   Meds ordered this encounter  Medications  . ipratropium (ATROVENT) 0.06 % nasal spray    Sig: Place 2 sprays into both nostrils 4 (four) times daily. For up to 5-7 days then stop.    Dispense:  15 mL    Refill:  0      Follow up plan: Return in about 1 week (around 03/13/2020), or if symptoms worsen or fail to improve, for sinusitis.  Patient verbalizes understanding with the above medical recommendations including the limitation of remote medical advice.  Specific follow-up and call-back criteria were given for patient to follow-up or seek medical care more urgently if needed.  Total duration of direct patient care provided via video conference: 7 minutes    [name], [facility] 03/06/2020, 2:41 PM

## 2020-03-11 DIAGNOSIS — J0111 Acute recurrent frontal sinusitis: Secondary | ICD-10-CM

## 2020-03-11 MED ORDER — PREDNISONE 50 MG PO TABS: 50.0000 mg | ORAL_TABLET | Freq: Every day | ORAL | 0 refills | Status: DC

## 2020-03-11 MED ORDER — AZITHROMYCIN 250 MG PO TABS: ORAL_TABLET | ORAL | 0 refills | Status: DC

## 2020-03-12 ENCOUNTER — Other Ambulatory Visit: Payer: Self-pay | Admitting: Family Medicine

## 2020-03-12 DIAGNOSIS — J019 Acute sinusitis, unspecified: Secondary | ICD-10-CM

## 2020-03-12 NOTE — Telephone Encounter (Signed)
Requested medication (s) are due for refill today: yes  Requested medication (s) are on the active medication list: yes  Last refill:  03/06/20  Future visit scheduled: no  Notes to clinic:  no assigned protocol; pt requests 90 day supply    Requested Prescriptions  Pending Prescriptions Disp Refills   ipratropium (ATROVENT) 0.06 % nasal spray [Pharmacy Med Name: IPRATROPIUM 0.06% SPRAY] 15 mL 1    Sig: Place 2 sprays into both nostrils 4 (four) times daily. For up to 5-7 days then stop.      Off-Protocol Failed - 03/12/2020 11:47 AM      Failed - Medication not assigned to a protocol, review manually.      Passed - Valid encounter within last 12 months    Recent Outpatient Visits           6 days ago Acute rhinosinusitis   [facility] [place], Devonne Doughty, DO   2 months ago Annual physical exam   [facility] Olin Hauser, DO   5 months ago Acute recurrent frontal sinusitis   [facility] [place], Devonne Doughty, DO   6 months ago Acute non-recurrent maxillary sinusitis   [facility] [place], Devonne Doughty, DO   1 year ago Median longitudinal dystrophy of nail   [place], DO             Off-Protocol Failed - 03/12/2020 11:47 AM      Failed - Medication not assigned to a protocol, review manually.      Passed - Valid encounter within last 12 months    Recent Outpatient Visits           6 days ago Acute rhinosinusitis   [place], DO   2 months ago Annual physical exam   [place], DO   5 months ago Acute recurrent frontal sinusitis   [facility] [place], Devonne Doughty, DO   6 months ago Acute non-recurrent maxillary sinusitis   [facility] [place], Devonne Doughty, DO   1 year ago Median longitudinal dystrophy of nail    [place], Devonne Doughty, [place]

## 2020-04-01 ENCOUNTER — Encounter: Payer: Self-pay | Admitting: Family Medicine

## 2020-04-01 ENCOUNTER — Telehealth (INDEPENDENT_AMBULATORY_CARE_PROVIDER_SITE_OTHER): Payer: 59 | Admitting: Family Medicine

## 2020-04-01 ENCOUNTER — Other Ambulatory Visit: Payer: Self-pay

## 2020-04-01 DIAGNOSIS — J019 Acute sinusitis, unspecified: Secondary | ICD-10-CM | POA: Diagnosis not present

## 2020-04-01 DIAGNOSIS — J329 Chronic sinusitis, unspecified: Secondary | ICD-10-CM | POA: Insufficient documentation

## 2020-04-01 MED ORDER — DOXYCYCLINE HYCLATE 100 MG PO TABS: 100.0000 mg | ORAL_TABLET | Freq: Two times a day (BID) | ORAL | 0 refills | Status: DC

## 2020-04-01 MED ORDER — FLUTICASONE PROPIONATE 50 MCG/ACT NA SUSP: 2.0000 | Freq: Every day | NASAL | 6 refills | Status: DC

## 2020-04-01 NOTE — Patient Instructions (Signed)
As we discussed, we are treating you for having a suspected sinus infection.  I have sent in a prescription for Doxycycline 100mg  to take 1 tablet 2x per day for the next 10 days.  I have also sent in a prescription for Flonase.  Can continue your allegra and singulair along with these medications.  Can use over the counter medications for cold/cough, according to packaging directions.  We will plan to see you back if symptoms worsen or fail to improve  You will receive a survey after today's visit either digitally by e-mail or paper by USPS mail. Your experiences and feedback matter to [place].  Please respond so we know how we are doing as we provide care for you.  Call us with any questions/concerns/needs.  It is my goal to be available to you for your health concerns.  Thanks for choosing me to be a partner in your healthcare needs!  Harlin Rain, FNP-C Family Nurse Practitioner [facility] Phone: [phone]

## 2020-04-01 NOTE — Progress Notes (Signed)
Virtual Visit via Telephone  The purpose of this virtual visit is to provide medical care while limiting exposure to the novel coronavirus (COVID19) for both patient and office staff.  Consent was obtained for phone visit:  Yes.   Answered questions that patient had about telehealth interaction:  Yes.   I discussed the limitations, risks, security and privacy concerns of performing an evaluation and management service by telephone. I also discussed with the patient that there may be a patient responsible charge related to this service. The patient expressed understanding and agreed to proceed.  Patient is at home and is accessed via telephone Services are provided by Harlin Rain, FNP-C from [facility])  ---------------------------------------------------------------------- Chief Complaint  Patient presents with  . Sinus Problem    nasal drainage, post nasal drainage, bilateral eye pain, eye pressure, face sensativity, cough  and bilateral ear discomfort. x4 days. Pt was recently seen at [facility] and didn't agree with the treatment.     S: Reviewed CMA documentation. I have called patient and gathered additional HPI as follows:  Ms. Haak presents for telemedicine visit, originally via video, due to technical difficulties was transferred to telephonic visit.  Has concerns for 4 days of purulent nasal drainage, post nasal drip, bilateral eye pain, eye pressure, facial pressure, non-productive cough and ear pain.  Denies fevers, sore throat, change in taste/smell, SOB, CP, abdominal pain, n/v/d.  Was seen at local urgent care and diagnosed with viral infection 2 days ago.  Patient is currently home Denies any high risk travel to areas of current concern for COVID19. Denies any known or suspected exposure to person with or possibly with COVID19.  Denies any fevers, chills, sweats, body ache, cough, shortness of breath, abdominal pain, diarrhea  History  reviewed. No pertinent past medical history. Social History   Tobacco Use  . Smoking status: Former Smoker    Packs/day: 0.50    Years: 20.00    Pack years: 10.00    Types: Cigarettes    Quit date: 03/21/2018    Years since quitting: 2.0  . Smokeless tobacco: Former Systems developer  . Tobacco comment: Few years had breaks in smoking  Vaping Use  . Vaping Use: Never used  Substance Use Topics  . Alcohol use: No    Alcohol/week: 0.0 standard drinks  . Drug use: No    Current Outpatient Medications:  .  ANORO ELLIPTA 62.5-25 MCG/INH AEPB, Inhale 1 puff into the lungs daily., Disp: 60 each, Rfl: 2 .  benzonatate (TESSALON) 200 MG capsule, Take by mouth., Disp: , Rfl:  .  fexofenadine (ALLEGRA) 180 MG tablet, Take 1 tablet (180 mg total) by mouth daily., Disp: 90 tablet, Rfl: 3 .  montelukast (SINGULAIR) 10 MG tablet, TAKE 1 TABLET BY MOUTH EVERYDAY AT BEDTIME, Disp: 90 tablet, Rfl: 1 .  sertraline (ZOLOFT) 25 MG tablet, Take 1 tablet (25 mg total) by mouth daily., Disp: 90 tablet, Rfl: 3 .  doxycycline (VIBRA-TABS) 100 MG tablet, Take 1 tablet (100 mg total) by mouth 2 (two) times daily., Disp: 20 tablet, Rfl: 0 .  fluticasone (FLONASE) 50 MCG/ACT nasal spray, Place 2 sprays into both nostrils daily., Disp: 16 g, Rfl: 6 .  ipratropium (ATROVENT) 0.06 % nasal spray, PLACE 2 SPRAYS INTO BOTH NOSTRILS 4 (FOUR) TIMES DAILY. FOR UP TO 5-7 DAYS THEN STOP. (Patient not taking: Reported on 04/01/2020), Disp: 45 mL, Rfl: 1 .  predniSONE (DELTASONE) 50 MG tablet, Take 1 tablet (50 mg total) by  mouth daily with breakfast. (Patient not taking: Reported on 04/01/2020), Disp: 5 tablet, Rfl: 0  Depression screen [facility] 2/9 12/29/2019 08/31/2019 10/25/2018  Decreased Interest 0 0 0  Down, Depressed, Hopeless 0 0 0  PHQ - 2 Score 0 0 0  Altered sleeping - 0 0  Tired, decreased energy - 0 0  Change in appetite - 0 0  Feeling bad or failure about yourself  - 0 0  Trouble concentrating - 0 0  Moving slowly or  fidgety/restless - 0 0  Suicidal thoughts - 0 0  PHQ-9 Score - 0 0  Difficult doing work/chores - Not difficult at all Not difficult at all    GAD 7 : Generalized Anxiety Score 09/19/2018 06/13/2018 03/28/2018 02/11/2017  Nervous, Anxious, on Edge 0 2 1 1   Control/stop worrying 0 2 1 0  Worry too much - different things 0 2 0 0  Trouble relaxing 0 2 1 0  Restless 0 1 0 0  Easily annoyed or irritable 0 2 1 0  Afraid - awful might happen 0 1 1 0  Total GAD 7 Score 0 12 5 1   Anxiety Difficulty Not difficult at all Not difficult at all Somewhat difficult Not difficult at all    -------------------------------------------------------------------------- O: No physical exam performed due to remote telephone encounter.  Physical Exam: Patient remotely monitored without video.  Verbal communication appropriate.  Cognition normal.  No results found for this or any previous visit (from the past 2160 hour(s)).  -------------------------------------------------------------------------- A&P:  Problem List Items Addressed This Visit      Respiratory   Sinusitis - Primary    Acute sinusitis based on symptoms.  Has green purulent drainage with facial pain x 4 days.  Discussed antibiotics and will treat with Doxycycline 100mg  BID x 10 days.    Plan: 1. Begin doxycycline 100mg  BID x 10 days 2. Continue allegra daily allergy medicine and can take mucinex, according to packaging directions 3. Rx for flonase sent to pharmacy on file, to use as discussed 4. RTC for any worsening of symptoms, or if symptoms fail to improve.      Relevant Medications   benzonatate (TESSALON) 200 MG capsule   doxycycline (VIBRA-TABS) 100 MG tablet   fluticasone (FLONASE) 50 MCG/ACT nasal spray      Meds ordered this encounter  Medications  . doxycycline (VIBRA-TABS) 100 MG tablet    Sig: Take 1 tablet (100 mg total) by mouth 2 (two) times daily.    Dispense:  20 tablet    Refill:  0  . fluticasone (FLONASE)  50 MCG/ACT nasal spray    Sig: Place 2 sprays into both nostrils daily.    Dispense:  16 g    Refill:  6    Follow-up: - Return as needed if symptoms worse or fail to improve  Patient verbalizes understanding with the above medical recommendations including the limitation of remote medical advice.  Specific follow-up and call-back criteria were given for patient to follow-up or seek medical care more urgently if needed.  - Time spent in direct consultation with patient on phone: 6 minutes  Harlin Rain, [facility] 04/01/2020, 2:42 PM

## 2020-04-01 NOTE — Assessment & Plan Note (Signed)
Acute sinusitis based on symptoms.  Has green purulent drainage with facial pain x 4 days.  Discussed antibiotics and will treat with Doxycycline 100mg  BID x 10 days.    Plan: 1. Begin doxycycline 100mg  BID x 10 days 2. Continue allegra daily allergy medicine and can take mucinex, according to packaging directions 3. Rx for flonase sent to pharmacy on file, to use as discussed 4. RTC for any worsening of symptoms, or if symptoms fail to improve.

## 2020-05-07 ENCOUNTER — Encounter: Payer: Self-pay | Admitting: Family Medicine

## 2020-05-22 ENCOUNTER — Encounter: Payer: Self-pay | Admitting: Family Medicine

## 2020-05-22 ENCOUNTER — Telehealth (INDEPENDENT_AMBULATORY_CARE_PROVIDER_SITE_OTHER): Payer: 59 | Admitting: Family Medicine

## 2020-05-22 ENCOUNTER — Other Ambulatory Visit: Payer: Self-pay

## 2020-05-22 DIAGNOSIS — J0111 Acute recurrent frontal sinusitis: Secondary | ICD-10-CM | POA: Diagnosis not present

## 2020-05-22 MED ORDER — PREDNISONE 20 MG PO TABS: ORAL_TABLET | ORAL | 0 refills | Status: DC

## 2020-05-22 NOTE — Patient Instructions (Addendum)
  1. Reassurance, likely self-limited - no indication for antibiotics at this time 2. Trial on Prednisone taper over 7 days, for reducing sinus acute pain and pressure, has worked before 3. Trial on Sudafed (behind the counter, she has correct medicine currently) reduce dose to 1 every 4-6 hour PRN instead of 2 made her dizzy 4. Keep on allergy therapy, may retry nasal spray she has atrovent if interested 5. Referral to [facility] ENT Dr Richardson Landry for recurrent problem sinusitis   Please schedule a Follow-up Appointment to: Return if symptoms worsen or fail to improve, for sinusitis.  If you have any other questions or concerns, please feel free to call the office or send a message through [facility]. You may also schedule an earlier appointment if necessary.  Additionally, you may be receiving a survey about your experience at our office within a few days to 1 week by e-mail or mail. We value your feedback.  [name], DO [place]

## 2020-05-22 NOTE — Progress Notes (Signed)
Subjective:    Patient ID: BRODY KUMP, female    DOB: 1971/08/30, 49 y.o.   MRN: [id number]  NYLA CREASON is a 49 y.o. female presenting on 05/22/2020 for Sinusitis (nasal drainage, start nasal bleed with blowing nose, HA and left side eye pain above the eyebrow --denies fever )  Patient presents for a same day appointment.  Virtual / Telehealth Encounter - Video Visit via MyChart The purpose of this virtual visit is to provide medical care while limiting exposure to the novel coronavirus (COVID19) for both patient and office staff.  Consent was obtained for remote visit:  Yes.   Answered questions that patient had about telehealth interaction:  Yes.   I discussed the limitations, risks, security and privacy concerns of performing an evaluation and management service by video/telephone. I also discussed with the patient that there may be a patient responsible charge related to this service. The patient expressed understanding and agreed to proceed.  Patient Location: Home Provider Location: [facility] (Office)   HPI   SINUSITIS, Pain / Pressure Reports no known sick contacts lately. She is unvaccinated against COVID but not concerned about covid at this time. She describes onset 2-3 days ago, now persistent worse with persistent sinus pressure and headache, mostly behind L eye - Tried Mucinex, Tylenol Ext Str. She has sudafed (pseudophedrine but took it in past full dose and made her dizzy) has not started this yet - Last visits for sinus, seen me virtually 03/06/20, given atrovent nasal only and prednisone, temporary relief, later had viral infection within 1 month, then again recurrent sinusitis, given Doxycycline. - Limited success on Flonase, has stopped this. - Overall 4 sinus infections in past 8-9 months approximately - Her son sees Dr Sonny Dandy ENT, she is interested to see him Denies any fever chills sweats dyspnea cough nausea vomiting abdominal  pain diarrhea, loss of taste or smell  Health Maintenance: Unvaccinated against COVID.  Depression screen [facility] 2/9 12/29/2019 08/31/2019 10/25/2018  Decreased Interest 0 0 0  Down, Depressed, Hopeless 0 0 0  PHQ - 2 Score 0 0 0  Altered sleeping - 0 0  Tired, decreased energy - 0 0  Change in appetite - 0 0  Feeling bad or failure about yourself  - 0 0  Trouble concentrating - 0 0  Moving slowly or fidgety/restless - 0 0  Suicidal thoughts - 0 0  PHQ-9 Score - 0 0  Difficult doing work/chores - Not difficult at all Not difficult at all    Social History   Tobacco Use  . Smoking status: Former Smoker    Packs/day: 0.50    Years: 20.00    Pack years: 10.00    Types: Cigarettes    Quit date: 03/21/2018    Years since quitting: 2.1  . Smokeless tobacco: Former Systems developer  . Tobacco comment: Few years had breaks in smoking  Vaping Use  . Vaping Use: Never used  Substance Use Topics  . Alcohol use: No    Alcohol/week: 0.0 standard drinks  . Drug use: No    Review of Systems Per HPI unless specifically indicated above     Objective:    LMP  (LMP Unknown)   Wt Readings from Last 3 Encounters:  12/29/19 131 lb 9.6 oz (59.7 kg)  10/25/18 136 lb (61.7 kg)  09/19/18 127 lb (57.6 kg)    Physical Exam   Note examination was completely remotely via video observation objective data only  Gen - well-appearing, no acute distress or apparent pain, comfortable HEENT - eyes appear clear without discharge or redness Heart/Lungs - cannot examine virtually - observed no evidence of coughing or labored breathing. Skin - face visible today- no rash Neuro - awake, alert, oriented Psych - not anxious appearing  Results for orders placed or performed in visit on 12/26/19  VITAMIN D 25 Hydroxy (Vit-D Deficiency, Fractures)  Result Value Ref Range   Vit D, 25-Hydroxy 20 (L) 30 - 100 ng/mL  TSH  Result Value Ref Range   TSH 3.44 mIU/L  COMPLETE METABOLIC PANEL WITH GFR  Result Value Ref  Range   Glucose, Bld 118 (H) 65 - 99 mg/dL   BUN 15 7 - 25 mg/dL   Creat 0.73 0.50 - 1.05 mg/dL   GFR, Est Non African American 96 > OR = 60 mL/min/1.80m2   GFR, Est African American 111 > OR = 60 mL/min/1.52m2   BUN/Creatinine Ratio NOT APPLICABLE 6 - 22 (calc)   Sodium 141 135 - 146 mmol/L   Potassium 4.9 3.5 - 5.3 mmol/L   Chloride 107 98 - 110 mmol/L   CO2 27 20 - 32 mmol/L   Calcium 10.0 8.6 - 10.4 mg/dL   Total Protein 6.7 6.1 - 8.1 g/dL   Albumin 4.4 3.6 - 5.1 g/dL   Globulin 2.3 1.9 - 3.7 g/dL (calc)   AG Ratio 1.9 1.0 - 2.5 (calc)   Total Bilirubin 0.5 0.2 - 1.2 mg/dL   Alkaline phosphatase (APISO) 86 37 - 153 U/L   AST 12 10 - 35 U/L   ALT 8 6 - 29 U/L  Hemoglobin A1c  Result Value Ref Range   Hgb A1c MFr Bld 6.2 (H) <5.7 % of total Hgb   Mean Plasma Glucose 131 (calc)   eAG (mmol/L) 7.3 (calc)  Lipid panel  Result Value Ref Range   Cholesterol 193 <200 mg/dL   HDL 60 > OR = 50 mg/dL   Triglycerides 100 <150 mg/dL   LDL Cholesterol (Calc) 112 (H) mg/dL (calc)   Total CHOL/HDL Ratio 3.2 <5.0 (calc)   Non-HDL Cholesterol (Calc) 133 (H) <130 mg/dL (calc)  CBC with Differential/Platelet  Result Value Ref Range   WBC 5.5 3.8 - 10.8 Thousand/uL   RBC 4.84 3.80 - 5.10 Million/uL   Hemoglobin 14.0 11.7 - 15.5 g/dL   HCT 43.2 35 - 45 %   MCV 89.3 80.0 - 100.0 fL   MCH 28.9 27.0 - 33.0 pg   MCHC 32.4 32.0 - 36.0 g/dL   RDW 12.0 11.0 - 15.0 %   Platelets 313 140 - 400 Thousand/uL   MPV 8.8 7.5 - 12.5 fL   Neutro Abs 2,800 1,500 - 7,800 cells/uL   Lymphs Abs 1,997 850 - 3,900 cells/uL   Absolute Monocytes 435 200 - 950 cells/uL   Eosinophils Absolute 231 15 - 500 cells/uL   Basophils Absolute 39 0 - 200 cells/uL   Neutrophils Relative % 50.9 %   Total Lymphocyte 36.3 %   Monocytes Relative 7.9 %   Eosinophils Relative 4.2 %   Basophils Relative 0.7 %      Assessment & Plan:   Problem List Items Addressed This Visit    Sinusitis - Primary   Relevant  Medications   predniSONE (DELTASONE) 20 MG tablet   Other Relevant Orders   Ambulatory referral to ENT      Consistent with acute vs recurrent frontal sinusitis, likely initially viral URI vs allergic rhinitis component  with concern with recurrent pattern. Clinically not consistent with bacterial sinusitis infection already treated with prior antibiotics for these flare ups in past 3 months. - Limited success on nasal spray therapy atrovent / flonase, only temporary - Already on singulair / allergy anti histamine therapy  Offered covid test for unvaccinated patient, she declined.  Plan: 1. Reassurance, likely self-limited - no indication for antibiotics at this time 2. Trial on Prednisone taper over 7 days, for reducing sinus acute pain and pressure, has worked before 3. Trial on Sudafed (behind the counter, she has correct medicine currently) reduce dose to 1 every 4-6 hour PRN instead of 2 made her dizzy 4. Keep on allergy therapy, may retry nasal spray she has atrovent if interested 5. Referral to [facility] ENT Dr Richardson Landry for recurrent problem sinusitis  Return criteria reviewed   Orders Placed This Encounter  Procedures  . Ambulatory referral to ENT    Referral Priority:   Routine    Referral Type:   Consultation    Referral Reason:   Specialty Services Required    Requested Specialty:   Otolaryngology    Number of Visits Requested:   1     Meds ordered this encounter  Medications  . predniSONE (DELTASONE) 20 MG tablet    Sig: Take daily with food. Start with 60mg  (3 pills) x 2 days, then reduce to 40mg  (2 pills) x 2 days, then 20mg  (1 pill) x 3 days    Dispense:  13 tablet    Refill:  0      Follow up plan: Return if symptoms worsen or fail to improve, for sinusitis.  [name], [facility] 05/22/2020, 10:12 AM

## 2020-05-24 DIAGNOSIS — J0111 Acute recurrent frontal sinusitis: Secondary | ICD-10-CM

## 2020-05-24 MED ORDER — AZITHROMYCIN 250 MG PO TABS: ORAL_TABLET | ORAL | 0 refills | Status: DC

## 2020-07-03 ENCOUNTER — Telehealth: Payer: Self-pay | Admitting: Family Medicine

## 2020-07-03 DIAGNOSIS — J019 Acute sinusitis, unspecified: Secondary | ICD-10-CM

## 2020-07-03 NOTE — Telephone Encounter (Signed)
Decline doxycycline refill Needs f/u

## 2020-07-03 NOTE — Telephone Encounter (Signed)
Requested medication (s) are due for refill today: yes  Requested medication (s) are on the active medication list: no  Last refill:  05/22/20  Future visit scheduled: no  Notes to clinic:  no assigned protocol    Requested Prescriptions  Pending Prescriptions Disp Refills   doxycycline (VIBRA-TABS) 100 MG tablet [Pharmacy Med Name: DOXYCYCLINE HYCLATE 100 MG TAB] 20 tablet 0    Sig: TAKE 1 TABLET BY MOUTH TWICE A DAY      Off-Protocol Failed - 07/03/2020  9:22 AM      Failed - Medication not assigned to a protocol, review manually.      Passed - Valid encounter within last 12 months    Recent Outpatient Visits           1 month ago Acute recurrent frontal sinusitis   [place], DO   3 months ago Acute non-recurrent sinusitis, unspecified location   [place], FNP   3 months ago Acute rhinosinusitis   [place], DO   6 months ago Annual physical exam   [place], DO   8 months ago Acute recurrent frontal sinusitis   [place], Devonne Dought[place], [place]

## 2020-07-29 ENCOUNTER — Encounter: Payer: Self-pay | Admitting: Family Medicine

## 2020-07-29 ENCOUNTER — Other Ambulatory Visit: Payer: Self-pay

## 2020-07-29 ENCOUNTER — Ambulatory Visit (INDEPENDENT_AMBULATORY_CARE_PROVIDER_SITE_OTHER): Payer: 59 | Admitting: Family Medicine

## 2020-07-29 VITALS — BP 94/47 | HR 89 | Temp 97.5°F | Resp 16 | Ht 67.0 in | Wt 131.6 lb

## 2020-07-29 DIAGNOSIS — H6982 Other specified disorders of Eustachian tube, left ear: Secondary | ICD-10-CM

## 2020-07-29 DIAGNOSIS — J3089 Other allergic rhinitis: Secondary | ICD-10-CM

## 2020-07-29 MED ORDER — AZELASTINE HCL 0.1 % NA SOLN: 1.0000 | Freq: Two times a day (BID) | NASAL | 5 refills | Status: DC

## 2020-07-29 NOTE — Progress Notes (Signed)
Subjective:    Patient ID: Lori Davies, female    DOB: [birth year], 49 y.o.   MRN: [id number]  Lori Davies is a 49 y.o. female presenting on 07/29/2020 for Ear Pain (soreness, itchy onset 2 weeks left side)   HPI   Left Ear Itching / Pressure Known chronic allergy and sinus issues, prior sinusitis last treat 05/2020, she was referred to [facility] ENT Dr Richardson Landry but could not make apt had to cancel, she is aware to reschedule Reports onset symptoms about 2 weeks ago with itching within Left ear and sounded like fluid and pressure in it, and then pressure improved then deeper itching in ear, and she had some pain with pressure on front of external ear, some episodic pain now more itching Tried swimmer's ear medicine and ear canal rinse no relief - Denies any ear discharge, hearing loss, hear pain  Health Maintenance: She is due for colon ca screening - she is checking into cologuard, notify is if ready to order Due for mammogram, pap smear she will check with GYN to schedule  Depression screen [facility] 2/9 12/29/2019 08/31/2019 10/25/2018  Decreased Interest 0 0 0  Down, Depressed, Hopeless 0 0 0  PHQ - 2 Score 0 0 0  Altered sleeping - 0 0  Tired, decreased energy - 0 0  Change in appetite - 0 0  Feeling bad or failure about yourself  - 0 0  Trouble concentrating - 0 0  Moving slowly or fidgety/restless - 0 0  Suicidal thoughts - 0 0  PHQ-9 Score - 0 0  Difficult doing work/chores - Not difficult at all Not difficult at all    Social History   Tobacco Use  . Smoking status: Former Smoker    Packs/day: 0.50    Years: 20.00    Pack years: 10.00    Types: Cigarettes    Quit date: 03/21/2018    Years since quitting: 2.3  . Smokeless tobacco: Former Systems developer  . Tobacco comment: Few years had breaks in smoking  Vaping Use  . Vaping Use: Never used  Substance Use Topics  . Alcohol use: No    Alcohol/week: 0.0 standard drinks  . Drug use: No    Review of Systems Per HPI unless  specifically indicated above     Objective:    BP (!) 94/47   Pulse 89   Temp (!) 97.5 F (36.4 C) (Temporal)   Resp 16   Ht 5\' 7"  (1.702 m)   Wt 131 lb 9.6 oz (59.7 kg)   LMP  (LMP Unknown)   SpO2 100%   BMI 20.61 kg/m   Wt Readings from Last 3 Encounters:  07/29/20 131 lb 9.6 oz (59.7 kg)  12/29/19 131 lb 9.6 oz (59.7 kg)  10/25/18 136 lb (61.7 kg)    Physical Exam Vitals and nursing note reviewed.  Constitutional:      General: She is not in acute distress.    Appearance: She is well-developed. She is not diaphoretic.     Comments: Well-appearing, comfortable, cooperative  HENT:     Head: Normocephalic and atraumatic.     Right Ear: Tympanic membrane, ear canal and external ear normal. There is no impacted cerumen.     Left Ear: Ear canal and external ear normal. There is no impacted cerumen.     Ears:     Comments: Left TM with some clear effusion fullness loss of normal architecture, no erythema. Some wax on ear canal but  not blocking, no eczema Eyes:     General:        Right eye: No discharge.        Left eye: No discharge.     Conjunctiva/sclera: Conjunctivae normal.  Cardiovascular:     Rate and Rhythm: Normal rate.  Pulmonary:     Effort: Pulmonary effort is normal.  Skin:    General: Skin is warm and dry.     Findings: No erythema or rash.  Neurological:     Mental Status: She is alert and oriented to person, place, and time.  Psychiatric:        Behavior: Behavior normal.     Comments: Well groomed, good eye contact, normal speech and thoughts    Results for orders placed or performed in visit on 12/26/19  VITAMIN D 25 Hydroxy (Vit-D Deficiency, Fractures)  Result Value Ref Range   Vit D, 25-Hydroxy 20 (L) 30 - 100 ng/mL  TSH  Result Value Ref Range   TSH 3.44 mIU/L  COMPLETE METABOLIC PANEL WITH GFR  Result Value Ref Range   Glucose, Bld 118 (H) 65 - 99 mg/dL   BUN 15 7 - 25 mg/dL   Creat 0.73 0.50 - 1.05 mg/dL   GFR, Est Non African  American 96 > OR = 60 mL/min/1.23m2   GFR, Est African American 111 > OR = 60 mL/min/1.44m2   BUN/Creatinine Ratio NOT APPLICABLE 6 - 22 (calc)   Sodium 141 135 - 146 mmol/L   Potassium 4.9 3.5 - 5.3 mmol/L   Chloride 107 98 - 110 mmol/L   CO2 27 20 - 32 mmol/L   Calcium 10.0 8.6 - 10.4 mg/dL   Total Protein 6.7 6.1 - 8.1 g/dL   Albumin 4.4 3.6 - 5.1 g/dL   Globulin 2.3 1.9 - 3.7 g/dL (calc)   AG Ratio 1.9 1.0 - 2.5 (calc)   Total Bilirubin 0.5 0.2 - 1.2 mg/dL   Alkaline phosphatase (APISO) 86 37 - 153 U/L   AST 12 10 - 35 U/L   ALT 8 6 - 29 U/L  Hemoglobin A1c  Result Value Ref Range   Hgb A1c MFr Bld 6.2 (H) <5.7 % of total Hgb   Mean Plasma Glucose 131 (calc)   eAG (mmol/L) 7.3 (calc)  Lipid panel  Result Value Ref Range   Cholesterol 193 <200 mg/dL   HDL 60 > OR = 50 mg/dL   Triglycerides 100 <150 mg/dL   LDL Cholesterol (Calc) 112 (H) mg/dL (calc)   Total CHOL/HDL Ratio 3.2 <5.0 (calc)   Non-HDL Cholesterol (Calc) 133 (H) <130 mg/dL (calc)  CBC with Differential/Platelet  Result Value Ref Range   WBC 5.5 3.8 - 10.8 Thousand/uL   RBC 4.84 3.80 - 5.10 Million/uL   Hemoglobin 14.0 11.7 - 15.5 g/dL   HCT 43.2 35 - 45 %   MCV 89.3 80.0 - 100.0 fL   MCH 28.9 27.0 - 33.0 pg   MCHC 32.4 32.0 - 36.0 g/dL   RDW 12.0 11.0 - 15.0 %   Platelets 313 140 - 400 Thousand/uL   MPV 8.8 7.5 - 12.5 fL   Neutro Abs 2,800 1,500 - 7,800 cells/uL   Lymphs Abs 1,997 850 - 3,900 cells/uL   Absolute Monocytes 435 200 - 950 cells/uL   Eosinophils Absolute 231 15.0 - 500.0 cells/uL   Basophils Absolute 39 0.0 - 200.0 cells/uL   Neutrophils Relative % 50.9 %   Total Lymphocyte 36.3 %   Monocytes Relative 7.9 %  Eosinophils Relative 4.2 %   Basophils Relative 0.7 %      Assessment & Plan:   Problem List Items Addressed This Visit    Allergic rhinitis - Primary   Relevant Medications   azelastine (ASTELIN) 0.1 % nasal spray    Other Visit Diagnoses    Acute dysfunction of left  eustachian tube       Relevant Medications   azelastine (ASTELIN) 0.1 % nasal spray      Subacute on chronic vs Recurrent L > R eustachian tube dysfunction with secondary L ear effusion, without loss of hearing or evidence of AOM or sinusitis. Likely related allergic rhinosinusitis based on exam and history.  Previous refer to [facility] ENT not scheduled, she will call them now and schedule  Plan: 1. ADD new Astelin nasal spray 1 spray both sides BID 2. Continue Flonase, Anti histamine, singulair 3. Recommend may consider trial OTC oral decongestant for up to 1 week or less 4. AVOID Afrin nasal decongestant, counseled if uses, max dose up to 3 days or less, avoid rebound rhinitis 5. Follow-up if not improved or worsening, return criteria - consider short prednisone burst   Meds ordered this encounter  Medications  . azelastine (ASTELIN) 0.1 % nasal spray    Sig: Place 1 spray into both nostrils 2 (two) times daily. Use in each nostril as directed    Dispense:  30 mL    Refill:  5      Follow up plan: Return if symptoms worsen or fail to improve.   [name], DO [facility] 07/29/2020, 3:30 PM

## 2020-07-29 NOTE — Patient Instructions (Addendum)
Thank you for coming to the office today.  Please go ahead and call Dr Richardson Landry for [facility] to schedule with him for this issue  Start Astelin nasal spray 1 spray twice a day each nostril for allergies / sinuses  Looks like fluid behind ear / eustachian tube dysfunction  Keep other medicines  Colon Cancer Screening: - For all adults age 50+ routine colon cancer screening is highly recommended.     - Recent guidelines from [place] recommend starting age of 52 - Early detection of colon cancer is important, because often there are no warning signs or symptoms, also if found early usually it can be cured. Late stage is hard to treat.  - If you are not interested in Colonoscopy screening (if done and normal you could be cleared for 5 to 10 years until next due), then Cologuard is an excellent alternative for screening test for Colon Cancer. It is highly sensitive for detecting DNA of colon cancer from even the earliest stages. Also, there is NO bowel prep required. - If Cologuard is NEGATIVE, then it is good for 3 years before next due - If Cologuard is POSITIVE, then it is strongly advised to get a Colonoscopy, which allows the GI doctor to locate the source of the cancer or polyp (even very early stage) and treat it by removing it. ------------------------- If you would like to proceed with Cologuard (stool DNA test) - FIRST, call your insurance company and tell them you want to check cost of Cologuard tell them CPT Code [phone] (it may be completely covered and you could get for no cost, OR max cost without any coverage is about $600). Also, keep in mind if you do NOT open the kit, and decide not to do the test, you will NOT be charged, you should contact the company if you decide not to do the test. - If you want to proceed, you can notify us (phone message, [place], or at next visit) and we will order it for you. The test kit will be delivered to you house within about  1 week. Follow instructions to collect sample, you may call the company for any help or questions, 24/7 telephone support at [phone].   Please schedule a Follow-up Appointment to: Return if symptoms worsen or fail to improve.  If you have any other questions or concerns, please feel free to call the office or send a message through [place]. You may also schedule an earlier appointment if necessary.  Additionally, you may be receiving a survey about your experience at our office within a few days to 1 week by e-mail or mail. We value your feedback.  [name], DO [place]

## 2020-08-21 ENCOUNTER — Telehealth: Payer: Self-pay

## 2020-08-21 NOTE — Telephone Encounter (Signed)
Would need a negative COVID test to be seen in our office as well.  No way to ensure that it is not COVID without a test.  Can see Dr. Raliegh Ip when he returns next week or can have COVID testing and see ENT.  Whichever she prefers.  TY

## 2020-08-21 NOTE — Telephone Encounter (Signed)
OK to re-establish with one of the new providers.

## 2020-08-21 NOTE — Telephone Encounter (Signed)
Copied from [place] [phone]. Topic: General - Other >> Aug 21, 2020 11:43 AM Lori Davies wrote: Reason for CRM: Pt wants to come back to this practice and re establish with Dr. Wynetta Emery Please advise if Dr. Wynetta Emery will approve to have Pt back

## 2020-08-22 ENCOUNTER — Telehealth: Payer: Self-pay

## 2020-08-22 NOTE — Telephone Encounter (Signed)
Pt verbalized understanding,

## 2020-08-22 NOTE — Telephone Encounter (Addendum)
Regarding my chart message patient was informed Nicole's reply --she does not want to get Covid test done and assure that she does not have Covid and will call reg scheduling appt but also notice from the chart that she is trying to re-establish at [facility] family pract[facility].

## 2020-09-23 NOTE — Telephone Encounter (Signed)
Patient checking on the status of message below, informed patient of PCP instruction. Patient would like a follow up call regarding scheduling with a new provider preferable a female but okay to see a female.

## 2020-09-27 NOTE — Telephone Encounter (Signed)
Pt was advised we will not be taking new pt's until February and she stated she would call back.

## 2020-10-10 DIAGNOSIS — J019 Acute sinusitis, unspecified: Secondary | ICD-10-CM

## 2020-10-11 ENCOUNTER — Telehealth (INDEPENDENT_AMBULATORY_CARE_PROVIDER_SITE_OTHER): Payer: 59 | Admitting: Family Medicine

## 2020-10-11 ENCOUNTER — Encounter: Payer: Self-pay | Admitting: Family Medicine

## 2020-10-11 ENCOUNTER — Other Ambulatory Visit: Payer: Self-pay

## 2020-10-11 VITALS — Ht 67.0 in | Wt 135.0 lb

## 2020-10-11 DIAGNOSIS — J441 Chronic obstructive pulmonary disease with (acute) exacerbation: Secondary | ICD-10-CM | POA: Diagnosis not present

## 2020-10-11 MED ORDER — AZITHROMYCIN 250 MG PO TABS: ORAL_TABLET | ORAL | 0 refills | Status: DC

## 2020-10-11 MED ORDER — PREDNISONE 20 MG PO TABS: ORAL_TABLET | ORAL | 0 refills | Status: DC

## 2020-10-11 NOTE — Progress Notes (Signed)
Virtual Visit via Telephone The purpose of this virtual visit is to provide medical care while limiting exposure to the novel coronavirus (COVID19) for both patient and office staff.  Consent was obtained for phone visit:  Yes.   Answered questions that patient had about telehealth interaction:  Yes.   I discussed the limitations, risks, security and privacy concerns of performing an evaluation and management service by telephone. I also discussed with the patient that there may be a patient responsible charge related to this service. The patient expressed understanding and agreed to proceed.  Patient Location: Home Provider Location: [facility] (Office)  Participants in virtual visit: - Patient: Lori Davies - CMA: Orinda Kenner, CMA - Provider: Dr Parks Ranger  ---------------------------------------------------------------------- Chief Complaint  Patient presents with  . Cough  . Chest Pain  . Pressure Behind the Eyes    S: Reviewed CMA documentation. I have called patient and gathered additional HPI as follows:  COPD EXACERBATION / SINUSITIS Reports that symptoms started >3 days ago, with thicker sinus and chest congestion yellow with blood tinged at times, having chest tightness, wheezing and coughing. Admits dyspnea. Using Anoro. Rescue inhaler. Similar to prior COPD - She has upcoming [facility] apt on 10/28/20  Denies any known or suspected exposure to person with or possibly with COVID19.  Denies any fevers, chills, sweats, body ache, headache, abdominal pain, diarrhea  History reviewed. No pertinent past medical history. Social History   Tobacco Use  . Smoking status: Former Smoker    Packs/day: 0.50    Years: 20.00    Pack years: 10.00    Types: Cigarettes    Quit date: 03/21/2018    Years since quitting: 2.5  . Smokeless tobacco: Former Systems developer  . Tobacco comment: Few years had breaks in smoking  Vaping Use  . Vaping Use: Never used   Substance Use Topics  . Alcohol use: No    Alcohol/week: 0.0 standard drinks  . Drug use: No    Current Outpatient Medications:  .  ANORO ELLIPTA 62.5-25 MCG/INH AEPB, Inhale 1 puff into the lungs daily., Disp: 60 each, Rfl: 2 .  azelastine (ASTELIN) 0.1 % nasal spray, Place 1 spray into both nostrils 2 (two) times daily. Use in each nostril as directed, Disp: 30 mL, Rfl: 5 .  azithromycin (ZITHROMAX Z-PAK) 250 MG tablet, Take 2 tabs (500mg  total) on Day 1. Take 1 tab (250mg ) daily for next 4 days., Disp: 6 tablet, Rfl: 0 .  fexofenadine (ALLEGRA) 180 MG tablet, Take 1 tablet (180 mg total) by mouth daily., Disp: 90 tablet, Rfl: 3 .  fluticasone (FLONASE) 50 MCG/ACT nasal spray, Place 2 sprays into both nostrils daily., Disp: 16 g, Rfl: 6 .  montelukast (SINGULAIR) 10 MG tablet, TAKE 1 TABLET BY MOUTH EVERYDAY AT BEDTIME, Disp: 90 tablet, Rfl: 1 .  predniSONE (DELTASONE) 20 MG tablet, Take daily with food. Start with 60mg  (3 pills) x 2 days, then reduce to 40mg  (2 pills) x 2 days, then 20mg  (1 pill) x 3 days, Disp: 13 tablet, Rfl: 0 .  sertraline (ZOLOFT) 25 MG tablet, Take 1 tablet (25 mg total) by mouth daily., Disp: 90 tablet, Rfl: 3  Depression screen [facility] 2/9 12/29/2019 08/31/2019 10/25/2018  Decreased Interest 0 0 0  Down, Depressed, Hopeless 0 0 0  PHQ - 2 Score 0 0 0  Altered sleeping - 0 0  Tired, decreased energy - 0 0  Change in appetite - 0 0  Feeling bad  or failure about yourself  - 0 0  Trouble concentrating - 0 0  Moving slowly or fidgety/restless - 0 0  Suicidal thoughts - 0 0  PHQ-9 Score - 0 0  Difficult doing work/chores - Not difficult at all Not difficult at all    GAD 7 : Generalized Anxiety Score 09/19/2018 06/13/2018 03/28/2018 02/11/2017  Nervous, Anxious, on Edge 0 2 1 1   Control/stop worrying 0 2 1 0  Worry too much - different things 0 2 0 0  Trouble relaxing 0 2 1 0  Restless 0 1 0 0  Easily annoyed or irritable 0 2 1 0  Afraid - awful might happen 0 1 1 0   Total GAD 7 Score 0 12 5 1   Anxiety Difficulty Not difficult at all Not difficult at all Somewhat difficult Not difficult at all    -------------------------------------------------------------------------- O: No physical exam performed due to remote telephone encounter.  Lab results reviewed.  No results found for this or any previous visit (from the past 2160 hour(s)).  -------------------------------------------------------------------------- A&P:  Problem List Items Addressed This Visit   None   Visit Diagnoses    COPD with acute exacerbation ([place])    -  Primary   Relevant Medications   azithromycin (ZITHROMAX Z-PAK) 250 MG tablet   predniSONE (DELTASONE) 20 MG tablet     Consistent with mild acute exacerbation of COPD with worsening productive cough. Similar to prior exacerbations, last 05/2020. Afebrile Unvaccinated to COVID. Counseling on vaccine. Offered COVID test  Plan: 1. Start Prednisone taper over 7 days 2. Start Azithromycin Z pak (antibiotic) 2 tabs day 1, then 1 tab x 4 days, complete entire course even if improved 3. Continue Anoro 4. Use albuterol q 4 hr regularly x 2-3 days. Continue maintenance inhalers RTC about 1 week if not improving, otherwise strict return criteria to go to ED Future consider CXR  Meds ordered this encounter  Medications  . azithromycin (ZITHROMAX Z-PAK) 250 MG tablet    Sig: Take 2 tabs (500mg  total) on Day 1. Take 1 tab (250mg ) daily for next 4 days.    Dispense:  6 tablet    Refill:  0  . predniSONE (DELTASONE) 20 MG tablet    Sig: Take daily with food. Start with 60mg  (3 pills) x 2 days, then reduce to 40mg  (2 pills) x 2 days, then 20mg  (1 pill) x 3 days    Dispense:  13 tablet    Refill:  0    Follow-up: - Return in 1-2 week if unresolved  Patient verbalizes understanding with the above medical recommendations including the limitation of remote medical advice.  Specific follow-up and call-back criteria were given for  patient to follow-up or seek medical care more urgently if needed.   - Time spent in direct consultation with patient on phone: 12 minutes   [name], [facility] 10/11/2020, 1:03 PM

## 2020-10-11 NOTE — Patient Instructions (Addendum)
°  1. Start Prednisone taper over 7 days 2. Start Azithromycin Z pak (antibiotic) 2 tabs day 1, then 1 tab x 4 days, complete entire course even if improved 3. Continue Anoro 4. Use albuterol q 4 hr regularly x 2-3 days. Continue maintenance inhalers RTC about 1 week if not improving, otherwise strict return criteria to go to ED Future consider CXR  Please schedule a Follow-up Appointment to: Return in about 1 week (around 10/18/2020), or if symptoms worsen or fail to improve.  If you have any other questions or concerns, please feel free to call the office or send a message through [place]. You may also schedule an earlier appointment if necessary.  Additionally, you may be receiving a survey about your experience at our office within a few days to 1 week by e-mail or mail. We value your feedback.  [name], DO [place]

## 2020-10-16 ENCOUNTER — Other Ambulatory Visit: Payer: Self-pay | Admitting: Family Medicine

## 2020-10-16 DIAGNOSIS — J3081 Allergic rhinitis due to animal (cat) (dog) hair and dander: Secondary | ICD-10-CM

## 2020-10-16 DIAGNOSIS — J301 Allergic rhinitis due to pollen: Secondary | ICD-10-CM

## 2020-10-16 NOTE — Telephone Encounter (Signed)
Requested Prescriptions  Pending Prescriptions Disp Refills  . montelukast (SINGULAIR) 10 MG tablet [Pharmacy Med Name: MONTELUKAST SOD 10 MG TABLET] 90 tablet 1    Sig: TAKE 1 TABLET BY MOUTH EVERYDAY AT BEDTIME     Pulmonology:  Leukotriene Inhibitors Passed - 10/16/2020  8:02 AM      Passed - Valid encounter within last 12 months    Recent Outpatient Visits          5 days ago COPD with acute exacerbation [facility])   [place], DO   2 months ago Seasonal allergic rhinitis due to other allergic trigger   [place], DO   4 months ago Acute recurrent frontal sinusitis   [place], DO   6 months ago Acute non-recurrent sinusitis, unspecified location   [place], FNP   7 months ago Acute rhinosinusitis   [place], Devonne Dought[place], [place]

## 2020-10-18 MED ORDER — DOXYCYCLINE HYCLATE 100 MG PO TABS: 100.0000 mg | ORAL_TABLET | Freq: Two times a day (BID) | ORAL | 0 refills | Status: DC

## 2020-10-18 NOTE — Addendum Note (Signed)
Addended by: Olin Hauser on: 10/18/2020 05:42 PM   Modules accepted: Orders

## 2021-01-07 ENCOUNTER — Other Ambulatory Visit: Payer: Self-pay | Admitting: Family Medicine

## 2021-01-07 DIAGNOSIS — F411 Generalized anxiety disorder: Secondary | ICD-10-CM

## 2021-01-26 ENCOUNTER — Other Ambulatory Visit: Payer: Self-pay | Admitting: Family Medicine

## 2021-01-26 DIAGNOSIS — J301 Allergic rhinitis due to pollen: Secondary | ICD-10-CM

## 2021-01-26 NOTE — Telephone Encounter (Signed)
Requested Prescriptions  Pending Prescriptions Disp Refills  . fexofenadine (ALLEGRA) 180 MG tablet [Pharmacy Med Name: FEXOFENADINE HCL 180 MG TABLET] 90 tablet 3    Sig: TAKE 1 TABLET BY MOUTH EVERY DAY     Ear, Nose, and Throat:  Antihistamines Passed - 01/26/2021 12:45 AM      Passed - Valid encounter within last 12 months    Recent Outpatient Visits          3 months ago COPD with acute exacerbation [facility])   [place], DO   6 months ago Seasonal allergic rhinitis due to other allergic trigger   [place], DO   8 months ago Acute recurrent frontal sinusitis   [place], DO   10 months ago Acute non-recurrent sinusitis, unspecified location   [place], [place]   10 months ago Acute rhinosinusitis   [place], [place]

## 2021-01-28 ENCOUNTER — Other Ambulatory Visit: Payer: Self-pay

## 2021-01-28 DIAGNOSIS — J432 Centrilobular emphysema: Secondary | ICD-10-CM

## 2021-01-28 MED ORDER — ANORO ELLIPTA 62.5-25 MCG/INH IN AEPB: 1.0000 | INHALATION_SPRAY | Freq: Every day | RESPIRATORY_TRACT | 2 refills | Status: DC

## 2021-03-17 ENCOUNTER — Other Ambulatory Visit: Payer: Self-pay

## 2021-03-17 ENCOUNTER — Encounter: Payer: Self-pay | Admitting: Family Medicine

## 2021-03-17 ENCOUNTER — Ambulatory Visit (INDEPENDENT_AMBULATORY_CARE_PROVIDER_SITE_OTHER): Payer: 59 | Admitting: Family Medicine

## 2021-03-17 VITALS — BP 110/68 | HR 81 | Ht 67.0 in | Wt 126.8 lb

## 2021-03-17 DIAGNOSIS — J019 Acute sinusitis, unspecified: Secondary | ICD-10-CM | POA: Diagnosis not present

## 2021-03-17 DIAGNOSIS — R309 Painful micturition, unspecified: Secondary | ICD-10-CM

## 2021-03-17 DIAGNOSIS — E559 Vitamin D deficiency, unspecified: Secondary | ICD-10-CM

## 2021-03-17 DIAGNOSIS — N3001 Acute cystitis with hematuria: Secondary | ICD-10-CM | POA: Diagnosis not present

## 2021-03-17 LAB — POCT URINALYSIS DIPSTICK
Bilirubin, UA: NEGATIVE
Glucose, UA: NEGATIVE
Ketones, UA: NEGATIVE
Nitrite, UA: NEGATIVE
Protein, UA: POSITIVE — AB
Spec Grav, UA: 1.02 (ref 1.010–1.025)
Urobilinogen, UA: 0.2 E.U./dL
pH, UA: 5 (ref 5.0–8.0)

## 2021-03-17 MED ORDER — VITAMIN D3 125 MCG (5000 UT) PO CAPS: 5000.0000 [IU] | ORAL_CAPSULE | Freq: Every day | ORAL | 2 refills | Status: DC

## 2021-03-17 MED ORDER — AMOXICILLIN 500 MG PO CAPS: 500.0000 mg | ORAL_CAPSULE | Freq: Two times a day (BID) | ORAL | 0 refills | Status: DC

## 2021-03-17 NOTE — Patient Instructions (Addendum)
Thank you for coming to the office today.  Amoxicillin antibiotic for urinary infection and sinuses  REconsider ENT in future.  May use prednisone that you have if chest tightness worse.  Low Vitamin D previous result Start OTC Vitamin D3 5,000 iu daily for 12 weeks then reduce to OTC Vitamin D3 2,000 iu daily for maintenance   Colon Cancer Screening: - For all adults age 51+ routine colon cancer screening is highly recommended.     - Recent guidelines from [place] recommend starting age of 2 - Early detection of colon cancer is important, because often there are no warning signs or symptoms, also if found early usually it can be cured. Late stage is hard to treat.  - If you are not interested in Colonoscopy screening (if done and normal you could be cleared for 5 to 10 years until next due), then Cologuard is an excellent alternative for screening test for Colon Cancer. It is highly sensitive for detecting DNA of colon cancer from even the earliest stages. Also, there is NO bowel prep required. - If Cologuard is NEGATIVE, then it is good for 3 years before next due - If Cologuard is POSITIVE, then it is strongly advised to get a Colonoscopy, which allows the GI doctor to locate the source of the cancer or polyp (even very early stage) and treat it by removing it. ------------------------- If you would like to proceed with Cologuard (stool DNA test) - FIRST, call your insurance company and tell them you want to check cost of Cologuard tell them CPT Code [phone] (it may be completely covered and you could get for no cost, OR max cost without any coverage is about $600). Also, keep in mind if you do NOT open the kit, and decide not to do the test, you will NOT be charged, you should contact the company if you decide not to do the test. - If you want to proceed, you can notify us (phone message, [place], or at next visit) and we will order it for you. The test kit will be  delivered to you house within about 1 week. Follow instructions to collect sample, you may call the company for any help or questions, 24/7 telephone support at [phone].   Please schedule a Follow-up Appointment to: Return if symptoms worsen or fail to improve.  If you have any other questions or concerns, please feel free to call the office or send a message through [place]. You may also schedule an earlier appointment if necessary.  Additionally, you may be receiving a survey about your experience at our office within a few days to 1 week by e-mail or mail. We value your feedback.  [name], DO [place]

## 2021-03-17 NOTE — Progress Notes (Signed)
Subjective:    Patient ID: Lori Davies, female    DOB: [birth year], 50 y.o.   MRN: [id number]  Lori Davies is a 50 y.o. female presenting on 03/17/2021 for Abdominal Pain, painful urination, and Fatigue  Patient presents for a same day appointment.  HPI  Sinusitis UTI Fatigue Reports symptoms onset last week Wednesday 03/12/21 she describes fatigue, excessive tiredness, chest congestion, some facial sinus pressure. - Home COVID test negative last week Wednesday 03/12/21 then, repeat home covid tests on Friday 6/24 and Sunday 6/26 all NEGATIVE. Has upcoming ENT apt - however she had to cancel apt due to loss in the family. She has re-scheduled cancelled x 2 She has had history of recurrent sinusitis / copd exacerbation in past several years, seems to have multiple per year impacting her unsure cause or triggers. Now additionally with UTI symptoms dysuria burning, she feels like prior UTI. Intolerance to clavulanic acid part of augmentin, levaquin has caused upset stomach as well.   Health Maintenance: Asking about Cologuard  Depression screen [facility] 2/9 03/17/2021 12/29/2019 08/31/2019  Decreased Interest 0 0 0  Down, Depressed, Hopeless 0 0 0  PHQ - 2 Score 0 0 0  Altered sleeping 0 - 0  Tired, decreased energy 1 - 0  Change in appetite 0 - 0  Feeling bad or failure about yourself  0 - 0  Trouble concentrating 0 - 0  Moving slowly or fidgety/restless 0 - 0  Suicidal thoughts 0 - 0  PHQ-9 Score 1 - 0  Difficult doing work/chores Not difficult at all - Not difficult at all  Some recent data might be hidden    Social History   Tobacco Use   Smoking status: Former    Packs/day: 0.50    Years: 20.00    Pack years: 10.00    Types: Cigarettes    Quit date: 03/21/2018    Years since quitting: 2.9   Smokeless tobacco: Former   Tobacco comments:    Few years had breaks in smoking  Vaping Use   Vaping Use: Never used  Substance Use Topics   Alcohol use: No    Alcohol/week:  0.0 standard drinks   Drug use: No    Review of Systems Per HPI unless specifically indicated above     Objective:    BP 110/68   Pulse 81   Ht 5\' 7"  (1.702 m)   Wt 126 lb 12.8 oz (57.5 kg)   LMP  (LMP Unknown)   SpO2 100%   BMI 19.86 kg/m   Wt Readings from Last 3 Encounters:  03/17/21 126 lb 12.8 oz (57.5 kg)  10/11/20 135 lb (61.2 kg)  07/29/20 131 lb 9.6 oz (59.7 kg)    Physical Exam Vitals and nursing note reviewed.  Constitutional:      General: She is not in acute distress.    Appearance: She is well-developed. She is not diaphoretic.     Comments: Well-appearing, comfortable, cooperative  HENT:     Head: Normocephalic and atraumatic.  Eyes:     General:        Right eye: No discharge.        Left eye: No discharge.     Conjunctiva/sclera: Conjunctivae normal.  Neck:     Thyroid: No thyromegaly.  Cardiovascular:     Rate and Rhythm: Normal rate and regular rhythm.     Heart sounds: Normal heart sounds. No murmur heard. Pulmonary:     Effort: Pulmonary effort is  normal. No respiratory distress.     Breath sounds: Normal breath sounds. No wheezing or rales.  Musculoskeletal:        General: Normal range of motion.     Cervical back: Normal range of motion and neck supple.  Lymphadenopathy:     Cervical: No cervical adenopathy.  Skin:    General: Skin is warm and dry.     Findings: No erythema or rash.  Neurological:     Mental Status: She is alert and oriented to person, place, and time.  Psychiatric:        Behavior: Behavior normal.     Comments: Well groomed, good eye contact, normal speech and thoughts   Results for orders placed or performed in visit on 03/17/21  POCT Urinalysis Dipstick  Result Value Ref Range   Color, UA Yellow    Clarity, UA Cloudy    Glucose, UA Negative Negative   Bilirubin, UA Negative    Ketones, UA Negative    Spec Grav, UA 1.020 1.010 - 1.025   Blood, UA Small +    pH, UA 5.0 5.0 - 8.0   Protein, UA Positive (A)  Negative   Urobilinogen, UA 0.2 0.2 or 1.0 E.U./dL   Nitrite, UA Negative    Leukocytes, UA Small (1+) (A) Negative   Appearance     Odor        Assessment & Plan:   Problem List Items Addressed This Visit     Sinusitis   Relevant Medications   amoxicillin (AMOXIL) 500 MG capsule   Other Visit Diagnoses     Acute cystitis with hematuria    -  Primary   Relevant Medications   amoxicillin (AMOXIL) 500 MG capsule   Painful urination       Relevant Orders   POCT Urinalysis Dipstick (Completed)   Urine Culture   Vitamin D deficiency       Relevant Medications   Cholecalciferol (VITAMIN D3) 125 MCG (5000 UT) CAPS       Clinically consistent with UTI and confirmed on UA. No recent UTIs or abx courses.  No concern for pyelo today (no systemic symptoms, neg fever, back pain, n/v).  Plan: 1. Ordered Urine culture 2. Amoxicillin course, cannot take clavulanic acid or levaquin due to intolerance  #Sinusitis Similar to prior episodes, seems to trigger COPD exacerbation as well underlying comorbid condition Has not been able to go to ENT since prior referral due to circumstances COVID test negative multiple times, however suspicion did remain high for covid given constellation of symptoms.  Will order Amoxicillin for sinuses Can use existing prednisone if need Contact / follow up sooner if not improved before weekend.  #Vit D Deficiency previously Start OTC Vitamin D3 5,000 unit daily for 12 weeks then down to 2,000 unit  Future cologuard at physical offered  Orders Placed This Encounter  Procedures   Urine Culture   POCT Urinalysis Dipstick     Meds ordered this encounter  Medications   amoxicillin (AMOXIL) 500 MG capsule    Sig: Take 1 capsule (500 mg total) by mouth 2 (two) times daily. For 10 days    Dispense:  20 capsule    Refill:  0   Cholecalciferol (VITAMIN D3) 125 MCG (5000 UT) CAPS    Sig: Take 1 capsule (5,000 Units total) by mouth daily. For 12 weeks,  then start Vitamin D3 2,000 units daily (OTC)    Dispense:  30 capsule    Refill:  2  Follow up plan: Return if symptoms worsen or fail to improve.    [name], [facility] 03/17/2021, 9:40 AM

## 2021-03-18 LAB — URINE CULTURE
MICRO NUMBER:: 12053911
Result:: NO GROWTH
SPECIMEN QUALITY:: ADEQUATE

## 2021-03-19 DIAGNOSIS — J019 Acute sinusitis, unspecified: Secondary | ICD-10-CM

## 2021-03-20 MED ORDER — AZITHROMYCIN 250 MG PO TABS: ORAL_TABLET | ORAL | 0 refills | Status: DC

## 2021-03-31 ENCOUNTER — Ambulatory Visit (INDEPENDENT_AMBULATORY_CARE_PROVIDER_SITE_OTHER): Payer: 59 | Admitting: Family Medicine

## 2021-03-31 ENCOUNTER — Other Ambulatory Visit: Payer: Self-pay

## 2021-03-31 ENCOUNTER — Encounter: Payer: Self-pay | Admitting: Family Medicine

## 2021-03-31 VITALS — BP 107/66 | HR 71 | Ht 67.0 in | Wt 127.6 lb

## 2021-03-31 DIAGNOSIS — R7309 Other abnormal glucose: Secondary | ICD-10-CM | POA: Diagnosis not present

## 2021-03-31 DIAGNOSIS — Z8349 Family history of other endocrine, nutritional and metabolic diseases: Secondary | ICD-10-CM

## 2021-03-31 DIAGNOSIS — Z0000 Encounter for general adult medical examination without abnormal findings: Secondary | ICD-10-CM | POA: Diagnosis not present

## 2021-03-31 DIAGNOSIS — E7800 Pure hypercholesterolemia, unspecified: Secondary | ICD-10-CM | POA: Diagnosis not present

## 2021-03-31 DIAGNOSIS — J432 Centrilobular emphysema: Secondary | ICD-10-CM

## 2021-03-31 DIAGNOSIS — J3081 Allergic rhinitis due to animal (cat) (dog) hair and dander: Secondary | ICD-10-CM

## 2021-03-31 DIAGNOSIS — J301 Allergic rhinitis due to pollen: Secondary | ICD-10-CM

## 2021-03-31 DIAGNOSIS — Z1231 Encounter for screening mammogram for malignant neoplasm of breast: Secondary | ICD-10-CM | POA: Diagnosis not present

## 2021-03-31 DIAGNOSIS — E559 Vitamin D deficiency, unspecified: Secondary | ICD-10-CM

## 2021-03-31 MED ORDER — MONTELUKAST SODIUM 10 MG PO TABS: 10.0000 mg | ORAL_TABLET | Freq: Every day | ORAL | 3 refills | Status: DC

## 2021-03-31 NOTE — Progress Notes (Signed)
Subjective:    Patient ID: Lori Davies, female    DOB: 26-Mar-1971, 50 y.o.   MRN: [id number]  Lori Davies is a 50 y.o. female presenting on 03/31/2021 for Annual Exam   HPI  Here for Annual Physical and Lab Review.    Vitamin D Deficiency - mild low at 20 on last lab. She will start OTC and now taking Vitamin D3 5,000 unit most days. Due for repeat lab   Elevated A1c Last A1c 6.2, due for repeat. Diet - admits poor diet often still high in diet but adding more vegetables.   Allergies On Singulair, needs refill On Anti histamine   Centrilobular Emphysema (COPD) Chronic problem. Currently without wheezing or flare up. Has Anoro, continues on this   Anxiety, Chronic Reports that she denies any history of depression, primarily symptoms mostly with anxiety, prior wellbutrin but failed this, made her sleepy. Controlled on Sertraline 25mg  daily, not ready for refill yet    Health Maintenance:   Colon CA Screening: Never had colonoscopy or other colon cancer screening. Asymptomatic. No fam history. She said cologuard is covered but has to pay deductible. She will check cost of Cologuard.   Breast CA Screening: Due for mammogram screening. Last mammogram result negative 2006 done at [facility]. No prior history abnormal mammogram. No known family history of breast cancer. Currently asymptomatic. Request order  She was on Depo Provera shot in past that prevented menstrual cycles, now for past 12+ months she has not had menstrual cycle off depo. She feels like she is in menopause and asks about hormone testing for fatigue and other HRT option. She wants to see GYN to discuss this next.     History reviewed. No pertinent surgical history.   Depression screen [facility] 2/9 03/31/2021 03/17/2021 12/29/2019  Decreased Interest 0 0 0  Down, Depressed, Hopeless 0 0 0  PHQ - 2 Score 0 0 0  Altered sleeping 0 0 -  Tired, decreased energy 0 1 -  Change in appetite 0 0 -   Feeling bad or failure about yourself  0 0 -  Trouble concentrating 0 0 -  Moving slowly or fidgety/restless 0 0 -  Suicidal thoughts 0 0 -  PHQ-9 Score 0 1 -  Difficult doing work/chores Not difficult at all Not difficult at all -  Some recent data might be hidden    History reviewed. No pertinent past medical history. History reviewed. No pertinent surgical history. Social History   Socioeconomic History   Marital status: Married    Spouse name: Not on file   Number of children: Not on file   Years of education: Not on file   Highest education level: Not on file  Occupational History   Not on file  Tobacco Use   Smoking status: Former    Packs/day: 0.50    Years: 20.00    Pack years: 10.00    Types: Cigarettes    Quit date: 03/21/2018    Years since quitting: 3.0   Smokeless tobacco: Former   Tobacco comments:    Few years had breaks in smoking  Vaping Use   Vaping Use: Never used  Substance and Sexual Activity   Alcohol use: No    Alcohol/week: 0.0 standard drinks   Drug use: No   Sexual activity: Yes    Birth control/protection: Injection  Other Topics Concern   Not on file  Social History Narrative   Not on file   Social  Determinants of Health   Financial Resource Strain: Not on file  Food Insecurity: Not on file  Transportation Needs: Not on file  Physical Activity: Not on file  Stress: Not on file  Social Connections: Not on file  Intimate Partner Violence: Not on file   Family History  Problem Relation Age of Onset   Heart disease Mother    Lung disease Mother    Heart disease Father    Hypertension Father    Autism Son    Diabetes Maternal Aunt    Breast cancer Neg Hx    Colon cancer Neg Hx    Current Outpatient Medications on File Prior to Visit  Medication Sig   ANORO ELLIPTA 62.5-25 MCG/INH AEPB Inhale 1 puff into the lungs daily.   azelastine (ASTELIN) 0.1 % nasal spray Place 1 spray into both nostrils 2 (two) times daily. Use in each  nostril as directed   Cholecalciferol (VITAMIN D3) 125 MCG (5000 UT) CAPS Take 1 capsule (5,000 Units total) by mouth daily. For 12 weeks, then start Vitamin D3 2,000 units daily (OTC)   fexofenadine (ALLEGRA) 180 MG tablet TAKE 1 TABLET BY MOUTH EVERY DAY   fluticasone (FLONASE) 50 MCG/ACT nasal spray Place 2 sprays into both nostrils daily.   sertraline (ZOLOFT) 25 MG tablet TAKE 1 TABLET BY MOUTH EVERY DAY   No current facility-administered medications on file prior to visit.    Review of Systems  Constitutional:  Negative for activity change, appetite change, chills, diaphoresis, fatigue and fever.  HENT:  Negative for congestion and hearing loss.   Eyes:  Negative for visual disturbance.  Respiratory:  Negative for cough, chest tightness, shortness of breath and wheezing.   Cardiovascular:  Negative for chest pain, palpitations and leg swelling.  Gastrointestinal:  Negative for abdominal pain, constipation, diarrhea, nausea and vomiting.  Genitourinary:  Negative for dysuria, frequency and hematuria.  Musculoskeletal:  Negative for arthralgias and neck pain.  Skin:  Negative for rash.  Neurological:  Negative for dizziness, weakness, light-headedness, numbness and headaches.  Hematological:  Negative for adenopathy.  Psychiatric/Behavioral:  Negative for behavioral problems, dysphoric mood and sleep disturbance.   Per HPI unless specifically indicated above      Objective:    BP 107/66   Pulse 71   Ht 5\' 7"  (1.702 m)   Wt 127 lb 9.6 oz (57.9 kg)   LMP  (LMP Unknown)   SpO2 100%   BMI 19.98 kg/m   Wt Readings from Last 3 Encounters:  03/31/21 127 lb 9.6 oz (57.9 kg)  03/17/21 126 lb 12.8 oz (57.5 kg)  10/11/20 135 lb (61.2 kg)    Physical Exam Vitals and nursing note reviewed.  Constitutional:      General: She is not in acute distress.    Appearance: She is well-developed. She is not diaphoretic.     Comments: Well-appearing, comfortable, cooperative  HENT:      Head: Normocephalic and atraumatic.  Eyes:     General:        Right eye: No discharge.        Left eye: No discharge.     Conjunctiva/sclera: Conjunctivae normal.     Pupils: Pupils are equal, round, and reactive to light.  Neck:     Thyroid: No thyromegaly.  Cardiovascular:     Rate and Rhythm: Normal rate and regular rhythm.     Pulses: Normal pulses.     Heart sounds: Normal heart sounds. No murmur heard. Pulmonary:  Effort: Pulmonary effort is normal. No respiratory distress.     Breath sounds: Normal breath sounds. No wheezing or rales.  Abdominal:     General: Bowel sounds are normal. There is no distension.     Palpations: Abdomen is soft. There is no mass.     Tenderness: There is no abdominal tenderness.  Musculoskeletal:        General: No tenderness. Normal range of motion.     Cervical back: Normal range of motion and neck supple.     Comments: Upper / Lower Extremities: - Normal muscle tone, strength bilateral upper extremities 5/5, lower extremities 5/5  Lymphadenopathy:     Cervical: No cervical adenopathy.  Skin:    General: Skin is warm and dry.     Findings: No erythema or rash.  Neurological:     Mental Status: She is alert and oriented to person, place, and time.     Comments: Distal sensation intact to light touch all extremities  Psychiatric:        Mood and Affect: Mood normal.        Behavior: Behavior normal.        Thought Content: Thought content normal.     Comments: Well groomed, good eye contact, normal speech and thoughts     Results for orders placed or performed in visit on 03/17/21  Urine Culture   Specimen: Urine  Result Value Ref Range   MICRO NUMBER: [id number]    SPECIMEN QUALITY: Adequate    Sample Source NOT GIVEN    STATUS: FINAL    Result: No Growth   POCT Urinalysis Dipstick  Result Value Ref Range   Color, UA Yellow    Clarity, UA Cloudy    Glucose, UA Negative Negative   Bilirubin, UA Negative    Ketones, UA  Negative    Spec Grav, UA 1.020 1.010 - 1.025   Blood, UA Small +    pH, UA 5.0 5.0 - 8.0   Protein, UA Positive (A) Negative   Urobilinogen, UA 0.2 0.2 or 1.0 E.U./dL   Nitrite, UA Negative    Leukocytes, UA Small (1+) (A) Negative   Appearance     Odor        Assessment & Plan:   Problem List Items Addressed This Visit     Elevated hemoglobin A1c   Relevant Orders   Hemoglobin A1c   Centrilobular emphysema (HCC)   Relevant Medications   montelukast (SINGULAIR) 10 MG tablet   Other Visit Diagnoses     Annual physical exam    -  Primary   Relevant Orders   COMPLETE METABOLIC PANEL WITH GFR   Lipid panel   CBC with Differential/Platelet   Hemoglobin A1c   Pure hypercholesterolemia       Relevant Orders   COMPLETE METABOLIC PANEL WITH GFR   Lipid panel   TSH   Encounter for screening mammogram for malignant neoplasm of breast       Relevant Orders   MM DIGITAL SCREENING BILATERAL   Vitamin D deficiency       Relevant Orders   VITAMIN D 25 Hydroxy (Vit-D Deficiency, Fractures)   Family history of thyroid disease       Relevant Orders   TSH   Allergic rhinitis due to animal (cat) (dog) hair and dander       Relevant Medications   montelukast (SINGULAIR) 10 MG tablet   Allergic rhinitis due to pollen       Relevant  Medications   montelukast (SINGULAIR) 10 MG tablet       Updated Health Maintenance information Ordered Mammogram [facility], she will schedule She will check cost of Cologuard, and notify me for order Fasting labs ordered for this week, f/u results Encouraged improvement to lifestyle with diet and exercise Goal maintain weight  COPD Continue on Anoro Without flare now  Allergies Refill Singulair  A1c Last result 6.2, due for repeat Counseling on lifestyle primarily  GAD On Sertraline SSRI   Meds ordered this encounter  Medications   montelukast (SINGULAIR) 10 MG tablet    Sig: Take 1 tablet (10 mg total) by mouth at bedtime.     Dispense:  90 tablet    Refill:  3       Follow up plan: Return in about 3 days (around 04/03/2021) for 3 days fasting lab only Thurs 7/14.  [name], [facility] 03/31/2021, 1:30 PM

## 2021-03-31 NOTE — Patient Instructions (Addendum)
Thank you for coming to the office today.  Refilled Singulair for up to 1 year, Let me know if need refills on others.  For Mammogram screening for breast cancer   Call the [facility] below anytime to schedule your own appointment now that order has been placed.  [facility] [address], [place] [address] Phone: [phone]  Reminder to call and schedule w/ Rayne Du to discuss Post menopausal hormone deficiency / treatment options.  Colon Cancer Screening: - For all adults age 1+ routine colon cancer screening is highly recommended.     - Recent guidelines from [place] recommend starting age of 3 - Early detection of colon cancer is important, because often there are no warning signs or symptoms, also if found early usually it can be cured. Late stage is hard to treat.  - If you are not interested in Colonoscopy screening (if done and normal you could be cleared for 5 to 10 years until next due), then Cologuard is an excellent alternative for screening test for Colon Cancer. It is highly sensitive for detecting DNA of colon cancer from even the earliest stages. Also, there is NO bowel prep required. - If Cologuard is NEGATIVE, then it is good for 3 years before next due - If Cologuard is POSITIVE, then it is strongly advised to get a Colonoscopy, which allows the GI doctor to locate the source of the cancer or polyp (even very early stage) and treat it by removing it. ------------------------- If you would like to proceed with Cologuard (stool DNA test) - FIRST, call your insurance company and tell them you want to check cost of Cologuard tell them CPT Code [id number] (it may be completely covered and you could get for no cost, OR max cost without any coverage is about $600). Also, keep in mind if you do NOT open the kit, and decide not to do the test, you will NOT be charged, you should contact the  company if you decide not to do the test. - If you want to proceed, you can notify us (phone message, [place], or at next visit) and we will order it for you. The test kit will be delivered to you house within about 1 week. Follow instructions to collect sample, you may call the company for any help or questions, 24/7 telephone support at [phone].    DUE for FASTING BLOOD WORK (no food or drink after midnight before the lab appointment, only water or coffee without cream/sugar on the morning of)  SCHEDULE "Lab Only" visit in the morning at the clinic for lab draw in 3-4 days  - Make sure Lab Only appointment is at about 1 week before your next appointment, so that results will be available  For Lab Results, once available within 2-3 days of blood draw, you can can log in to MyChart online to view your results and a brief explanation. Also, we can discuss results at next follow-up visit.   Please schedule a Follow-up Appointment to: Return in about 3 days (around 04/03/2021) for 3 days fasting lab only Thurs 7/14.  If you have any other questions or concerns, please feel free to call the office or send a message through [facility]. You may also schedule an earlier appointment if necessary.  Additionally, you may be receiving a survey about your experience at our office within a few days to 1 week by e-mail or mail. We value your  feedback.  [name], DO [place]

## 2021-04-02 ENCOUNTER — Other Ambulatory Visit: Payer: Self-pay

## 2021-04-02 DIAGNOSIS — Z8349 Family history of other endocrine, nutritional and metabolic diseases: Secondary | ICD-10-CM

## 2021-04-02 DIAGNOSIS — R7309 Other abnormal glucose: Secondary | ICD-10-CM

## 2021-04-02 DIAGNOSIS — E7800 Pure hypercholesterolemia, unspecified: Secondary | ICD-10-CM

## 2021-04-02 DIAGNOSIS — Z0000 Encounter for general adult medical examination without abnormal findings: Secondary | ICD-10-CM

## 2021-04-02 DIAGNOSIS — E559 Vitamin D deficiency, unspecified: Secondary | ICD-10-CM

## 2021-04-03 ENCOUNTER — Other Ambulatory Visit: Payer: 59

## 2021-04-03 ENCOUNTER — Other Ambulatory Visit: Payer: Self-pay

## 2021-04-04 LAB — CBC WITH DIFFERENTIAL/PLATELET
Absolute Monocytes: 460 cells/uL (ref 200–950)
Basophils Absolute: 53 cells/uL (ref 0–200)
Basophils Relative: 0.9 %
Eosinophils Absolute: 325 cells/uL (ref 15–500)
Eosinophils Relative: 5.5 %
HCT: 44.9 % (ref 35.0–45.0)
Hemoglobin: 14.3 g/dL (ref 11.7–15.5)
Lymphs Abs: 1782 cells/uL (ref 850–3900)
MCH: 28.3 pg (ref 27.0–33.0)
MCHC: 31.8 g/dL — ABNORMAL LOW (ref 32.0–36.0)
MCV: 88.7 fL (ref 80.0–100.0)
MPV: 8.6 fL (ref 7.5–12.5)
Monocytes Relative: 7.8 %
Neutro Abs: 3280 cells/uL (ref 1500–7800)
Neutrophils Relative %: 55.6 %
Platelets: 315 10*3/uL (ref 140–400)
RBC: 5.06 10*6/uL (ref 3.80–5.10)
RDW: 12.7 % (ref 11.0–15.0)
Total Lymphocyte: 30.2 %
WBC: 5.9 10*3/uL (ref 3.8–10.8)

## 2021-04-04 LAB — HEMOGLOBIN A1C
Hgb A1c MFr Bld: 6.3 % of total Hgb — ABNORMAL HIGH (ref <5.7)
Mean Plasma Glucose: 134 mg/dL
eAG (mmol/L): 7.4 mmol/L

## 2021-04-04 LAB — LIPID PANEL
Cholesterol: 219 mg/dL — ABNORMAL HIGH (ref <200)
HDL: 66 mg/dL (ref >=50)
LDL Cholesterol (Calc): 127 mg/dL (calc) — ABNORMAL HIGH
Non-HDL Cholesterol (Calc): 153 mg/dL (calc) — ABNORMAL HIGH (ref <130)
Total CHOL/HDL Ratio: 3.3 (calc) (ref <5.0)
Triglycerides: 151 mg/dL — ABNORMAL HIGH (ref <150)

## 2021-04-04 LAB — TSH: TSH: 3.05 mIU/L

## 2021-04-04 LAB — COMPLETE METABOLIC PANEL WITH GFR
AG Ratio: 1.6 (calc) (ref 1.0–2.5)
ALT: 11 U/L (ref 6–29)
AST: 15 U/L (ref 10–35)
Albumin: 4.5 g/dL (ref 3.6–5.1)
Alkaline phosphatase (APISO): 74 U/L (ref 37–153)
BUN: 13 mg/dL (ref 7–25)
CO2: 26 mmol/L (ref 20–32)
Calcium: 10.1 mg/dL (ref 8.6–10.4)
Chloride: 106 mmol/L (ref 98–110)
Creat: 0.83 mg/dL (ref 0.50–1.03)
Globulin: 2.8 g/dL (calc) (ref 1.9–3.7)
Glucose, Bld: 104 mg/dL — ABNORMAL HIGH (ref 65–99)
Potassium: 4.5 mmol/L (ref 3.5–5.3)
Sodium: 140 mmol/L (ref 135–146)
Total Bilirubin: 0.6 mg/dL (ref 0.2–1.2)
Total Protein: 7.3 g/dL (ref 6.1–8.1)
eGFR: 85 mL/min/{1.73_m2} (ref >=60)

## 2021-04-04 LAB — VITAMIN D 25 HYDROXY (VIT D DEFICIENCY, FRACTURES): Vit D, 25-Hydroxy: 33 ng/mL (ref 30–100)

## 2021-05-12 ENCOUNTER — Telehealth: Payer: 59 | Admitting: Physician Assistant

## 2021-05-12 DIAGNOSIS — H66005 Acute suppurative otitis media without spontaneous rupture of ear drum, recurrent, left ear: Secondary | ICD-10-CM | POA: Diagnosis not present

## 2021-05-12 MED ORDER — DOXYCYCLINE HYCLATE 100 MG PO TABS: 100.0000 mg | ORAL_TABLET | Freq: Two times a day (BID) | ORAL | 0 refills | Status: DC

## 2021-05-12 NOTE — Progress Notes (Signed)
Virtual Visit Consent   Lori Davies, you are scheduled for a virtual visit with a [facility] provider today.     Just as with appointments in the office, your consent must be obtained to participate.  Your consent will be active for this visit and any virtual visit you may have with one of our providers in the next 365 days.     If you have a MyChart account, a copy of this consent can be sent to you electronically.  All virtual visits are billed to your insurance company just like a traditional visit in the office.    As this is a virtual visit, video technology does not allow for your provider to perform a traditional examination.  This may limit your provider's ability to fully assess your condition.  If your provider identifies any concerns that need to be evaluated in person or the need to arrange testing (such as labs, EKG, etc.), we will make arrangements to do so.     Although advances in technology are sophisticated, we cannot ensure that it will always work on either your end or our end.  If the connection with a video visit is poor, the visit may have to be switched to a telephone visit.  With either a video or telephone visit, we are not always able to ensure that we have a secure connection.     I need to obtain your verbal consent now.   Are you willing to proceed with your visit today?    Lori Davies has provided verbal consent on 05/12/2021 for a virtual visit (video or telephone).   Mar Daring, PA-C   Date: 05/12/2021 10:13 AM   Virtual Visit via Video Note   I, Mar Daring, connected with  Lori Davies  ([id number], 01/28/1971) on 05/12/21 at 10:00 AM EDT by a video-enabled telemedicine application and verified that I am speaking with the correct person using two identifiers.  Location: Patient: Virtual Visit Location Patient: Home Provider: Virtual Visit Location Provider: Home Office   I discussed the limitations of evaluation and management by  telemedicine and the availability of in person appointments. The patient expressed understanding and agreed to proceed.    History of Present Illness: Lori Davies is a 50 y.o. who identifies as a female who was assigned female at birth, and is being seen today for possible sinusitis.  HPI: Sinusitis This is a new problem. The current episode started in the past 7 days (Thursday night started vomiting after eating dinner out; had fever while vomiting. Had vomiting until 6am Friday. Since has had stuffiness in her sinus issues and left ear pain and pressure). The problem has been gradually worsening since onset. There has been no fever. Associated symptoms include congestion, ear pain (left) and sinus pressure. Pertinent negatives include no chills, coughing, headaches, hoarse voice, shortness of breath or sore throat. (Rhinorrhea,  post nasal drainage, dizziness yesterday) Past treatments include lying down and oral decongestants (sudafed congestion max strength, kaopectate for nasuea). The treatment provided mild relief.     Problems:  Patient Active Problem List   Diagnosis Date Noted   Sinusitis 04/01/2020   Elevated hemoglobin A1c 12/29/2019   GAD (generalized anxiety disorder) 06/13/2018   Centrilobular emphysema ([place]) 06/13/2018   Allergic rhinitis 02/11/2017   GERD (gastroesophageal reflux disease) 10/30/2015   Elevated IgE level 06/17/2015   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 06/17/2015   Restrictive lung disease 06/17/2015   Hyperinflation of lungs  06/17/2015    Allergies:  Allergies  Allergen Reactions   Clavulanic Acid Nausea And Vomiting   Fish Oil Hives   Sulfa Antibiotics    Medications:  Current Outpatient Medications:    doxycycline (VIBRA-TABS) 100 MG tablet, Take 1 tablet (100 mg total) by mouth 2 (two) times daily., Disp: 20 tablet, Rfl: 0   ANORO ELLIPTA 62.5-25 MCG/INH AEPB, Inhale 1 puff into the lungs daily., Disp: 60 each, Rfl: 2   azelastine (ASTELIN) 0.1 % nasal  spray, Place 1 spray into both nostrils 2 (two) times daily. Use in each nostril as directed, Disp: 30 mL, Rfl: 5   Cholecalciferol (VITAMIN D3) 125 MCG (5000 UT) CAPS, Take 1 capsule (5,000 Units total) by mouth daily. For 12 weeks, then start Vitamin D3 2,000 units daily (OTC), Disp: 30 capsule, Rfl: 2   fexofenadine (ALLEGRA) 180 MG tablet, TAKE 1 TABLET BY MOUTH EVERY DAY, Disp: 90 tablet, Rfl: 3   fluticasone (FLONASE) 50 MCG/ACT nasal spray, Place 2 sprays into both nostrils daily., Disp: 16 g, Rfl: 6   montelukast (SINGULAIR) 10 MG tablet, Take 1 tablet (10 mg total) by mouth at bedtime., Disp: 90 tablet, Rfl: 3   sertraline (ZOLOFT) 25 MG tablet, TAKE 1 TABLET BY MOUTH EVERY DAY, Disp: 90 tablet, Rfl: 1  Observations/Objective: Patient is well-developed, well-nourished in no acute distress.  Resting comfortably at home.  Head is normocephalic, atraumatic.  No labored breathing.  Speech is clear and coherent with logical content.  Patient is alert and oriented at baseline.  Patient seen tugging at left ear due to discomfort and pain  Assessment and Plan: 1. Recurrent acute suppurative otitis media without spontaneous rupture of left tympanic membrane - doxycycline (VIBRA-TABS) 100 MG tablet; Take 1 tablet (100 mg total) by mouth 2 (two) times daily.  Dispense: 20 tablet; Refill: 0  - Will treat with Doxycycline as above - Add flonase  - Discussed using EarEase or Calm Ear OTC for ear pain - May use Tylenol and/or Ibuprofen as needed - Push fluids - Rest as needed - Seek in person evaluation if symptoms fail to improve or worsen  Follow Up Instructions: I discussed the assessment and treatment plan with the patient. The patient was provided an opportunity to ask questions and all were answered. The patient agreed with the plan and demonstrated an understanding of the instructions.  A copy of instructions were sent to the patient via MyChart.  The patient was advised to call back  or seek an in-person evaluation if the symptoms worsen or if the condition fails to improve as anticipated.  Time:  I spent 18 minutes with the patient via telehealth technology discussing the above problems/concerns.    Mar Daring, PA-C

## 2021-05-12 NOTE — Patient Instructions (Signed)
Theone Murdoch, thank you for joining Mar Daring, PA-C for today's virtual visit.  While this provider is not your primary care provider (PCP), if your PCP is located in our provider database this encounter information will be shared with them immediately following your visit.  Consent: (Patient) Lori Davies provided verbal consent for this virtual visit at the beginning of the encounter.  Current Medications:  Current Outpatient Medications:    doxycycline (VIBRA-TABS) 100 MG tablet, Take 1 tablet (100 mg total) by mouth 2 (two) times daily., Disp: 20 tablet, Rfl: 0   ANORO ELLIPTA 62.5-25 MCG/INH AEPB, Inhale 1 puff into the lungs daily., Disp: 60 each, Rfl: 2   azelastine (ASTELIN) 0.1 % nasal spray, Place 1 spray into both nostrils 2 (two) times daily. Use in each nostril as directed, Disp: 30 mL, Rfl: 5   Cholecalciferol (VITAMIN D3) 125 MCG (5000 UT) CAPS, Take 1 capsule (5,000 Units total) by mouth daily. For 12 weeks, then start Vitamin D3 2,000 units daily (OTC), Disp: 30 capsule, Rfl: 2   fexofenadine (ALLEGRA) 180 MG tablet, TAKE 1 TABLET BY MOUTH EVERY DAY, Disp: 90 tablet, Rfl: 3   fluticasone (FLONASE) 50 MCG/ACT nasal spray, Place 2 sprays into both nostrils daily., Disp: 16 g, Rfl: 6   montelukast (SINGULAIR) 10 MG tablet, Take 1 tablet (10 mg total) by mouth at bedtime., Disp: 90 tablet, Rfl: 3   sertraline (ZOLOFT) 25 MG tablet, TAKE 1 TABLET BY MOUTH EVERY DAY, Disp: 90 tablet, Rfl: 1   Medications ordered in this encounter:  Meds ordered this encounter  Medications   doxycycline (VIBRA-TABS) 100 MG tablet    Sig: Take 1 tablet (100 mg total) by mouth 2 (two) times daily.    Dispense:  20 tablet    Refill:  0    Order Specific Question:   Supervising Provider    Answer:   Sabra Heck, BRIAN [[id number]]     *If you need refills on other medications prior to your next appointment, please contact your pharmacy*  Follow-Up: Call back or seek an in-person evaluation  if the symptoms worsen or if the condition fails to improve as anticipated.  Other Instructions Otitis Media, Adult  Otitis media is a condition in which the middle ear is red and swollen (inflamed) and full of fluid. The middle ear is the part of the ear that contains bones for hearing as well as air that helps send sounds to the brain. The conditionusually goes away on its own. What are the causes? This condition is caused by a blockage in the eustachian tube. The eustachian tube connects the middle ear to the back of the nose. It normally allows air into the middle ear. The blockage is caused by fluid or swelling. Problems that can cause blockage include: A cold or infection that affects the nose, mouth, or throat. Allergies. An irritant, such as tobacco smoke. Adenoids that have become large. The adenoids are soft tissue located in the back of the throat, behind the nose and the roof of the mouth. Growth or swelling in the upper part of the throat, just behind the nose (nasopharynx). Damage to the ear caused by change in pressure. This is called barotrauma. What are the signs or symptoms? Symptoms of this condition include: Ear pain. Fever. Problems with hearing. Being tired. Fluid leaking from the ear. Ringing in the ear. How is this treated? This condition can go away on its own within 3-5 days. But if the condition  is caused by bacteria or does not go away on its own, or if it keeps coming back, your doctor may: Give you antibiotic medicines. Give you medicines for pain. Follow these instructions at home: Take over-the-counter and prescription medicines only as told by your doctor. If you were prescribed an antibiotic medicine, take it as told by your doctor. Do not stop taking the antibiotic even if you start to feel better. Keep all follow-up visits as told by your doctor. This is important. Contact a doctor if: You have bleeding from your nose. There is a lump on your  neck. You are not feeling better in 5 days. You feel worse instead of better. Get help right away if: You have pain that is not helped with medicine. You have swelling, redness, or pain around your ear. You get a stiff neck. You cannot move part of your face (paralysis). You notice that the bone behind your ear hurts when you touch it. You get a very bad headache. Summary Otitis media means that the middle ear is red, swollen, and full of fluid. This condition usually goes away on its own. If the problem does not go away, treatment may be needed. You may be given medicines to treat the infection or to treat your pain. If you were prescribed an antibiotic medicine, take it as told by your doctor. Do not stop taking the antibiotic even if you start to feel better. Keep all follow-up visits as told by your doctor. This is important. This information is not intended to replace advice given to you by your health care provider. Make sure you discuss any questions you have with your healthcare provider. Document Revised: 08/10/2019 Document Reviewed: 08/10/2019 Elsevier Patient Education  2022 [employer].    If you have been instructed to have an in-person evaluation today at a local Urgent Care facility, please use the link below. It will take you to a list of all of our available [facility], including address, phone number and hours of operation. Please do not delay care.  [facility]  If you or a family member do not have a primary care provider, use the link below to schedule a visit and establish care. When you choose a [place] primary care physician or advanced practice provider, you gain a long-term partner in health. Find a Primary Care Provider  Learn more about [facility] in-office and virtual care options: [facility]

## 2021-06-04 ENCOUNTER — Ambulatory Visit: Payer: 59 | Admitting: Internal Medicine

## 2021-06-12 ENCOUNTER — Other Ambulatory Visit: Payer: Self-pay | Admitting: Family Medicine

## 2021-06-12 DIAGNOSIS — F411 Generalized anxiety disorder: Secondary | ICD-10-CM

## 2021-06-12 NOTE — Telephone Encounter (Signed)
Requested medications are due for refill today requesting early, filled 03/29/21  Requested medications are on the active medication list yes  Last refill 03/29/21  Last visit 03/31/21  Future visit scheduled 07/23/21  Notes to clinic requesting early, please assess.

## 2021-07-07 ENCOUNTER — Ambulatory Visit: Payer: 59 | Admitting: Internal Medicine

## 2021-07-23 ENCOUNTER — Ambulatory Visit: Payer: 59 | Admitting: Internal Medicine

## 2021-09-19 ENCOUNTER — Telehealth: Payer: 59 | Admitting: Physician Assistant

## 2021-09-19 ENCOUNTER — Encounter: Payer: Self-pay | Admitting: Family Medicine

## 2021-09-19 DIAGNOSIS — J019 Acute sinusitis, unspecified: Secondary | ICD-10-CM

## 2021-09-19 DIAGNOSIS — B9689 Other specified bacterial agents as the cause of diseases classified elsewhere: Secondary | ICD-10-CM | POA: Diagnosis not present

## 2021-09-19 MED ORDER — DOXYCYCLINE HYCLATE 100 MG PO TABS: 100.0000 mg | ORAL_TABLET | Freq: Two times a day (BID) | ORAL | 0 refills | Status: DC

## 2021-09-19 MED ORDER — PREDNISONE 10 MG PO TABS: ORAL_TABLET | ORAL | 0 refills | Status: AC

## 2021-09-19 NOTE — Progress Notes (Signed)
I have spent 5 minutes in review of e-visit questionnaire, review and updating patient chart, medical decision making and response to patient.   Jafar Poffenberger Cody Lurine Imel, PA-C    

## 2021-09-19 NOTE — Addendum Note (Signed)
Addended by: Brunetta Jeans on: 09/19/2021 05:21 PM   Modules accepted: Orders

## 2021-09-19 NOTE — Progress Notes (Signed)

## 2021-09-20 MED ORDER — AZITHROMYCIN 250 MG PO TABS: ORAL_TABLET | ORAL | 0 refills | Status: AC

## 2021-09-20 NOTE — Addendum Note (Signed)
Addended by: Brunetta Jeans on: 09/20/2021 12:29 PM   Modules accepted: Orders

## 2021-12-21 ENCOUNTER — Telehealth: Payer: Self-pay | Admitting: Physician Assistant

## 2021-12-21 DIAGNOSIS — B9689 Other specified bacterial agents as the cause of diseases classified elsewhere: Secondary | ICD-10-CM

## 2021-12-21 DIAGNOSIS — J441 Chronic obstructive pulmonary disease with (acute) exacerbation: Secondary | ICD-10-CM

## 2021-12-21 DIAGNOSIS — J019 Acute sinusitis, unspecified: Secondary | ICD-10-CM

## 2021-12-22 MED ORDER — PREDNISONE 10 MG (21) PO TBPK: ORAL_TABLET | ORAL | 0 refills | Status: DC

## 2021-12-22 MED ORDER — DOXYCYCLINE HYCLATE 100 MG PO TABS: 100.0000 mg | ORAL_TABLET | Freq: Two times a day (BID) | ORAL | 0 refills | Status: DC

## 2021-12-22 NOTE — Progress Notes (Signed)
Reorder of Prednisone, as reports Pharmacy did not get it on first round.  ? ?

## 2021-12-22 NOTE — Progress Notes (Signed)
E-Visit for Sinus Problems ? ?We are sorry that you are not feeling well.  Here is how we plan to help! ? ?Based on what you have shared with me it looks like you have sinusitis.  Sinusitis is inflammation and infection in the sinus cavities of the head.  Based on your presentation I believe you most likely have Acute Bacterial Sinusitis.  This is an infection caused by bacteria and is treated with antibiotics. I have prescribed Doxycycline '100mg'$  by mouth twice a day for 10 days. I will also prescribe a 6 day taper of Prednisone for possible COPD exacerbation that may have been triggered by drainage from the sinus infection. You may use an oral decongestant such as Mucinex D or if you have glaucoma or high blood pressure use plain Mucinex. Saline nasal spray help and can safely be used as often as needed for congestion.  If you develop worsening sinus pain, fever or notice severe headache and vision changes, or if symptoms are not better after completion of antibiotic, please schedule an appointment with a health care provider.   ? ?Sinus infections are not as easily transmitted as other respiratory infection, however we still recommend that you avoid close contact with loved ones, especially the very young and elderly.  Remember to wash your hands thoroughly throughout the day as this is the number one way to prevent the spread of infection! ? ?Home Care: ?Only take medications as instructed by your medical team. ?Complete the entire course of an antibiotic. ?Do not take these medications with alcohol. ?A steam or ultrasonic humidifier can help congestion.  You can place a towel over your head and breathe in the steam from hot water coming from a faucet. ?Avoid close contacts especially the very young and the elderly. ?Cover your mouth when you cough or sneeze. ?Always remember to wash your hands. ? ?Get Help Right Away If: ?You develop worsening fever or sinus pain. ?You develop a severe head ache or visual  changes. ?Your symptoms persist after you have completed your treatment plan. ? ?Make sure you ?Understand these instructions. ?Will watch your condition. ?Will get help right away if you are not doing well or get worse. ? ?Thank you for choosing an e-visit. ? ?Your e-visit answers were reviewed by a board certified advanced clinical practitioner to complete your personal care plan. Depending upon the condition, your plan could have included both over the counter or prescription medications. ? ?Please review your pharmacy choice. Make sure the pharmacy is open so you can pick up prescription now. If there is a problem, you may contact your provider through [employer] and have the prescription routed to another pharmacy.  Your safety is important to [place]. If you have drug allergies check your prescription carefully.  ? ?For the next 24 hours you can use MyChart to ask questions about today's visit, request a non-urgent call back, or ask for a work or school excuse. ?You will get an email in the next two days asking about your experience. I hope that your e-visit has been valuable and will speed your recovery. ? ?I provided 5 minutes of non face-to-face time during this encounter for chart review and documentation.  ? ?

## 2021-12-22 NOTE — Addendum Note (Signed)
Addended by: Perlie Mayo on: 12/22/2021 08:36 AM ? ? Modules accepted: Orders ? ?

## 2021-12-29 ENCOUNTER — Other Ambulatory Visit: Payer: Self-pay | Admitting: Family Medicine

## 2021-12-29 DIAGNOSIS — F411 Generalized anxiety disorder: Secondary | ICD-10-CM

## 2021-12-30 NOTE — Telephone Encounter (Signed)
Requested medications are due for refill today.  yes ? ?Requested medications are on the active medications list.  yes ? ?Last refill. 06/13/2021 #90 1 refill ? ?Future visit scheduled.   no ? ?Notes to clinic.  Medication refill is not delegated. ? ? ? ?Requested Prescriptions  ?Pending Prescriptions Disp Refills  ? sertraline (ZOLOFT) 25 MG tablet [Pharmacy Med Name: SERTRALINE HCL 25 MG TABLET] 90 tablet 1  ?  Sig: TAKE 1 TABLET BY MOUTH EVERY DAY  ?  ? Not Delegated - Psychiatry:  Antidepressants - SSRI - sertraline Failed - 12/29/2021 11:02 PM  ?  ?  Failed - This refill cannot be delegated  ?  ?  Failed - Valid encounter within last 6 months  ?  Recent Outpatient Visits   ? ?      ? 9 months ago Annual physical exam  ? [place], DO  ? 9 months ago Acute cystitis with hematuria  ? [place], DO  ? 1 year ago COPD with acute exacerbation [facility])  ? [place], DO  ? 1 year ago Seasonal allergic rhinitis due to other allergic trigger  ? [place], DO  ? 1 year ago Acute recurrent frontal sinusitis  ? [place], DO  ? ?  ?  ? ?  ?  ?  Passed - AST in normal range and within 360 days  ?  AST  ?Date Value Ref Range Status  ?04/03/2021 15 10 - 35 U/L Final  ?  ?  ?  ?  Passed - ALT in normal range and within 360 days  ?  ALT  ?Date Value Ref Range Status  ?04/03/2021 11 6 - 29 U/L Final  ?  ?  ?  ?  Passed - Completed PHQ-2 or PHQ-9 in the last 360 days  ?  ?  ?  ?

## 2022-01-07 ENCOUNTER — Telehealth: Payer: 59 | Admitting: Family Medicine

## 2022-02-26 ENCOUNTER — Other Ambulatory Visit: Payer: Self-pay | Admitting: Family Medicine

## 2022-02-26 DIAGNOSIS — Z1231 Encounter for screening mammogram for malignant neoplasm of breast: Secondary | ICD-10-CM

## 2022-04-11 ENCOUNTER — Telehealth: Payer: Self-pay | Admitting: Nurse Practitioner

## 2022-04-11 DIAGNOSIS — J0101 Acute recurrent maxillary sinusitis: Secondary | ICD-10-CM

## 2022-04-11 MED ORDER — AZITHROMYCIN 250 MG PO TABS: ORAL_TABLET | ORAL | 0 refills | Status: DC

## 2022-04-11 NOTE — Progress Notes (Signed)
E-Visit for Sinus Problems  We are sorry that you are not feeling well.  Here is how we plan to help!  Based on what you have shared with me it looks like you have sinusitis.  Sinusitis is inflammation and infection in the sinus cavities of the head.  Based on your presentation I believe you most likely have Acute Bacterial Sinusitis.  This is an infection caused by bacteria and is treated with antibiotics. I have prescribed z pac as requested. You do not need steroids at this time. You may use an oral decongestant such as Mucinex D or if you have glaucoma or high blood pressure use plain Mucinex. Saline nasal spray help and can safely be used as often as needed for congestion.  If you develop worsening sinus pain, fever or notice severe headache and vision changes, or if symptoms are not better after completion of antibiotic, please schedule an appointment with a health care provider.    Sinus infections are not as easily transmitted as other respiratory infection, however we still recommend that you avoid close contact with loved ones, especially the very young and elderly.  Remember to wash your hands thoroughly throughout the day as this is the number one way to prevent the spread of infection!  Home Care: Only take medications as instructed by your medical team. Complete the entire course of an antibiotic. Do not take these medications with alcohol. A steam or ultrasonic humidifier can help congestion.  You can place a towel over your head and breathe in the steam from hot water coming from a faucet. Avoid close contacts especially the very young and the elderly. Cover your mouth when you cough or sneeze. Always remember to wash your hands.  Get Help Right Away If: You develop worsening fever or sinus pain. You develop a severe head ache or visual changes. Your symptoms persist after you have completed your treatment plan.  Make sure you Understand these instructions. Will watch your  condition. Will get help right away if you are not doing well or get worse.  Thank you for choosing an e-visit.  Your e-visit answers were reviewed by a board certified advanced clinical practitioner to complete your personal care plan. Depending upon the condition, your plan could have included both over the counter or prescription medications.  Please review your pharmacy choice. Make sure the pharmacy is open so you can pick up prescription now. If there is a problem, you may contact your provider through [employer] and have the prescription routed to another pharmacy.  Your safety is important to [place]. If you have drug allergies check your prescription carefully.   For the next 24 hours you can use MyChart to ask questions about today's visit, request a non-urgent call back, or ask for a work or school excuse. You will get an email in the next two days asking about your experience. I hope that your e-visit has been valuable and will speed your recovery.  5-10 minutes spent reviewing and documenting in chart.

## 2022-04-20 ENCOUNTER — Ambulatory Visit (INDEPENDENT_AMBULATORY_CARE_PROVIDER_SITE_OTHER): Payer: 59 | Admitting: Family Medicine

## 2022-04-20 ENCOUNTER — Encounter: Payer: Self-pay | Admitting: Family Medicine

## 2022-04-20 ENCOUNTER — Ambulatory Visit: Payer: Self-pay

## 2022-04-20 VITALS — BP 117/73 | HR 99 | Temp 97.5°F | Ht 67.0 in | Wt 133.0 lb

## 2022-04-20 DIAGNOSIS — J0110 Acute frontal sinusitis, unspecified: Secondary | ICD-10-CM | POA: Diagnosis not present

## 2022-04-20 DIAGNOSIS — J440 Chronic obstructive pulmonary disease with acute lower respiratory infection: Secondary | ICD-10-CM

## 2022-04-20 DIAGNOSIS — J209 Acute bronchitis, unspecified: Secondary | ICD-10-CM | POA: Diagnosis not present

## 2022-04-20 MED ORDER — AMOXICILLIN-POT CLAVULANATE 875-125 MG PO TABS: 1.0000 | ORAL_TABLET | Freq: Two times a day (BID) | ORAL | 0 refills | Status: DC

## 2022-04-20 MED ORDER — PREDNISONE 20 MG PO TABS: ORAL_TABLET | ORAL | 0 refills | Status: DC

## 2022-04-20 NOTE — Patient Instructions (Addendum)
Thank you for coming to the office today.   1. It sounds like you have a Sinusitis (Bacterial Infection) - this most likely started as an Upper Respiratory Virus that has settled into an infection. Allergies can also cause this. - Start Augmentin 1 pill twice daily (breakfast and dinner, with food and plenty of water) for 10 days, complete entire course, do not stop early even if feeling better - Start Prednisone 7 day taper May use OTC spray or previous Atrovent spray  - Improve hydration by drinking plenty of clear fluids (water, gatorade) to reduce secretions and thin congestion - Congestion draining down throat can cause irritation. May try warm herbal tea with honey, cough drops - Can take Tylenol or Ibuprofen as needed for fevers - May continue over the counter cold medicine as you are, I would not use any decongestant or mucinex longer than 7 days.  If not improved can order chest x-ray 48 hours, caution I am out of office Friday and we do not do x-rays here on Fridays.  If you develop persistent fever >101F for at least 3 consecutive days, headaches with sinus pain or pressure or persistent earache, please schedule a follow-up evaluation within next few days to week.   Please schedule a Follow-up Appointment to: Return if symptoms worsen or fail to improve.  If you have any other questions or concerns, please feel free to call the office or send a message through [place]. You may also schedule an earlier appointment if necessary.  Additionally, you may be receiving a survey about your experience at our office within a few days to 1 week by e-mail or mail. We value your feedback.  [name], DO [place]

## 2022-04-20 NOTE — Progress Notes (Signed)
Subjective:    Patient ID: Lori Davies, female    DOB: August 03, 1971, 51 y.o.   MRN: [id number]  Lori Davies is a 51 y.o. female presenting on 04/20/2022 for Sinusitis (X12 days, negative covid test X3 last one 7/29 ,Fever for 9 days 101.6 only goes away with medication and comes back, headache, ears popping, cough, chest congestion, done e-visit 7/22 prescribed zpak did not help, has tries OTC medication sudafed, mucinex, dayquil helps some but not a lot)  Patient presents for a same day appointment.  HPI  Sinusitis Emphysema COPD with acute flare  Prior history of more sinusitis and COPD flare in past. Previously Augmentin has worked well for her. She has also tried Doxycycline in past. Recently did virtual visit given Zpak did not work at all. She admits 12 days of symptoms with fever for 9 days taking tylenol ibuprofen helps fever now seems improved but still has severe sinus symptoms drainage and cough with wheezing. No fever, improved on tylenol ibuprofen Taking Mucinex OTC, Sudafed      04/20/2022   11:19 AM 03/31/2021    1:22 PM 03/17/2021    9:28 AM  Depression screen PHQ 2/9  Decreased Interest 0 0 0  Down, Depressed, Hopeless 0 0 0  PHQ - 2 Score 0 0 0  Altered sleeping 0 0 0  Tired, decreased energy 1 0 1  Change in appetite 0 0 0  Feeling bad or failure about yourself  0 0 0  Trouble concentrating 0 0 0  Moving slowly or fidgety/restless 0 0 0  Suicidal thoughts 0 0 0  PHQ-9 Score 1 0 1  Difficult doing work/chores Not difficult at all Not difficult at all Not difficult at all    Social History   Tobacco Use   Smoking status: Former    Packs/day: 0.50    Years: 20.00    Total pack years: 10.00    Types: Cigarettes    Quit date: 03/21/2018    Years since quitting: 4.0   Smokeless tobacco: Former   Tobacco comments:    Few years had breaks in smoking  Vaping Use   Vaping Use: Never used  Substance Use Topics   Alcohol use: No    Alcohol/week: 0.0  standard drinks of alcohol   Drug use: No    Review of Systems Per HPI unless specifically indicated above     Objective:    BP 117/73   Pulse 99   Temp (!) 97.5 F (36.4 C) (Oral)   Ht _0  (1.702 m)   Wt 133 lb (60.3 kg)   LMP  (LMP Unknown)   SpO2 100%   BMI 20.83 kg/m   Wt Readings from Last 3 Encounters:  04/20/22 133 lb (60.3 kg)  03/31/21 127 lb 9.6 oz (57.9 kg)  03/17/21 126 lb 12.8 oz (57.5 kg)    Physical Exam Vitals and nursing note reviewed.  Constitutional:      General: She is not in acute distress.    Appearance: She is well-developed. She is not diaphoretic.     Comments: Well-appearing, comfortable, cooperative  HENT:     Head: Normocephalic and atraumatic.     Nose: Congestion present.  Eyes:     General:        Right eye: No discharge.        Left eye: No discharge.     Conjunctiva/sclera: Conjunctivae normal.  Neck:     Thyroid: No thyromegaly.  Cardiovascular:  Rate and Rhythm: Normal rate and regular rhythm.     Heart sounds: Normal heart sounds. No murmur heard. Pulmonary:     Effort: Pulmonary effort is normal. No respiratory distress.     Breath sounds: Wheezing present. No rales.     Comments: cough Musculoskeletal:        General: Normal range of motion.     Cervical back: Normal range of motion and neck supple.  Lymphadenopathy:     Cervical: No cervical adenopathy.  Skin:    General: Skin is warm and dry.     Findings: No erythema or rash.  Neurological:     Mental Status: She is alert and oriented to person, place, and time.  Psychiatric:        Behavior: Behavior normal.     Comments: Well groomed, good eye contact, normal speech and thoughts    Results for orders placed or performed in visit on 04/02/21  VITAMIN D 25 Hydroxy (Vit-D Deficiency, Fractures)  Result Value Ref Range   Vit D, 25-Hydroxy 33 30 - 100 ng/mL  TSH  Result Value Ref Range   TSH 3.05 mIU/L  Hemoglobin A1c  Result Value Ref Range   Hgb A1c  MFr Bld 6.3 (H) <5.7 % of total Hgb   Mean Plasma Glucose 134 mg/dL   eAG (mmol/L) 7.4 mmol/L  CBC with Differential/Platelet  Result Value Ref Range   WBC 5.9 3.8 - 10.8 Thousand/uL   RBC 5.06 3.80 - 5.10 Million/uL   Hemoglobin 14.3 11.7 - 15.5 g/dL   HCT 44.9 35.0 - 45.0 %   MCV 88.7 80.0 - 100.0 fL   MCH 28.3 27.0 - 33.0 pg   MCHC 31.8 (L) 32.0 - 36.0 g/dL   RDW 12.7 11.0 - 15.0 %   Platelets 315 140 - 400 Thousand/uL   MPV 8.6 7.5 - 12.5 fL   Neutro Abs 3,280 1,500 - 7,800 cells/uL   Lymphs Abs 1,782 850 - 3,900 cells/uL   Absolute Monocytes 460 200 - 950 cells/uL   Eosinophils Absolute 325 15 - 500 cells/uL   Basophils Absolute 53 0 - 200 cells/uL   Neutrophils Relative % 55.6 %   Total Lymphocyte 30.2 %   Monocytes Relative 7.8 %   Eosinophils Relative 5.5 %   Basophils Relative 0.9 %  Lipid panel  Result Value Ref Range   Cholesterol 219 (H) <200 mg/dL   HDL 66 > OR = 50 mg/dL   Triglycerides 151 (H) <150 mg/dL   LDL Cholesterol (Calc) 127 (H) mg/dL (calc)   Total CHOL/HDL Ratio 3.3 <5.0 (calc)   Non-HDL Cholesterol (Calc) 153 (H) <130 mg/dL (calc)  COMPLETE METABOLIC PANEL WITH GFR  Result Value Ref Range   Glucose, Bld 104 (H) 65 - 99 mg/dL   BUN 13 7 - 25 mg/dL   Creat 0.83 0.50 - 1.03 mg/dL   eGFR 85 > OR = 60 mL/min/1.51m   BUN/Creatinine Ratio NOT APPLICABLE 6 - 22 (calc)   Sodium 140 135 - 146 mmol/L   Potassium 4.5 3.5 - 5.3 mmol/L   Chloride 106 98 - 110 mmol/L   CO2 26 20 - 32 mmol/L   Calcium 10.1 8.6 - 10.4 mg/dL   Total Protein 7.3 6.1 - 8.1 g/dL   Albumin 4.5 3.6 - 5.1 g/dL   Globulin 2.8 1.9 - 3.7 g/dL (calc)   AG Ratio 1.6 1.0 - 2.5 (calc)   Total Bilirubin 0.6 0.2 - 1.2 mg/dL   Alkaline phosphatase (  APISO) 74 37 - 153 U/L   AST 15 10 - 35 U/L   ALT 11 6 - 29 U/L      Assessment & Plan:   Problem List Items Addressed This Visit     Sinusitis - Primary   Relevant Medications   predniSONE (DELTASONE) 20 MG tablet    amoxicillin-clavulanate (AUGMENTIN) 875-125 MG tablet   Other Visit Diagnoses     Acute bronchitis with COPD ([place])       Relevant Medications   predniSONE (DELTASONE) 20 MG tablet       Consistent with acute frontal sinusitis, likely initially viral URI vs allergic rhinitis component with worsening concern for bacterial infection.  Failed Zpak Secondary COPD exacerbation now with bronchospasm wheezing  Plan: 1.  Start Augmentin 875-121m PO BID x 10 days 2. Start Prednisone taper 3. Inhalers PRN 4. OTC meds PRN Return criteria reviewed  Consider Chest X-ray if not improved 48 hours, note I will be out of office Friday.  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   Meds ordered this encounter  Medications   predniSONE (DELTASONE) 20 MG tablet    Sig: Take daily with food. Start with 692m(3 pills) x 2 days, then reduce to 4039m2 pills) x 2 days, then 29m68m pill) x 3 days    Dispense:  13 tablet    Refill:  0   amoxicillin-clavulanate (AUGMENTIN) 875-125 MG tablet    Sig: Take 1 tablet by mouth 2 (two) times daily.    Dispense:  20 tablet    Refill:  0      Follow up plan: Return if symptoms worsen or fail to improve.   AlexNobie Putnam [facility] 04/20/2022, 11:27 AM

## 2022-04-21 ENCOUNTER — Encounter: Payer: Self-pay | Admitting: Family Medicine

## 2022-04-30 ENCOUNTER — Encounter: Payer: Self-pay | Admitting: Family Medicine

## 2022-05-11 ENCOUNTER — Ambulatory Visit
Admission: RE | Admit: 2022-05-11 | Discharge: 2022-05-11 | Disposition: A | Discharge status: Home / Self Care | Payer: 59 | Source: Ambulatory Visit | Attending: Family Medicine | Admitting: Family Medicine

## 2022-05-11 DIAGNOSIS — Z1231 Encounter for screening mammogram for malignant neoplasm of breast: Secondary | ICD-10-CM | POA: Insufficient documentation

## 2022-05-23 ENCOUNTER — Encounter: Payer: Self-pay | Admitting: Family Medicine

## 2022-05-24 ENCOUNTER — Ambulatory Visit
Admission: EM | Admit: 2022-05-24 | Discharge: 2022-05-24 | Disposition: A | Discharge status: Home / Self Care | Payer: 59 | Attending: Emergency Medicine | Admitting: Emergency Medicine

## 2022-05-24 DIAGNOSIS — K643 Fourth degree hemorrhoids: Secondary | ICD-10-CM | POA: Diagnosis not present

## 2022-05-24 DIAGNOSIS — F410 Panic disorder [episodic paroxysmal anxiety] without agoraphobia: Secondary | ICD-10-CM | POA: Insufficient documentation

## 2022-05-24 MED ORDER — HYDROCORTISONE ACETATE 30 MG RE SUPP: 1.0000 | Freq: Two times a day (BID) | RECTAL | 1 refills | Status: AC

## 2022-05-24 NOTE — Discharge Instructions (Addendum)
I sent prescription for Proctocort suppositories to pharmacy.  You are welcome to use external sprays and creams while using Proctocort.  Please reach out to either your primary care provider or to general surgery to schedule an appointment for further evaluation and more definitive treatment of your thrombosed internal hemorrhoid.  Thank you for visiting urgent care today.

## 2022-05-24 NOTE — ED Triage Notes (Signed)
Patient presents to UC for external hemorrhoids  x 4 days. Has notes some bleeding when wiping. She has been treating with preparation x, creams, warm baths, and suppositories with no improvement.   Denies constipation.
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
## 2022-05-25 ENCOUNTER — Encounter: Payer: Self-pay | Admitting: Family Medicine

## 2022-05-25 DIAGNOSIS — K643 Fourth degree hemorrhoids: Secondary | ICD-10-CM

## 2022-05-26 ENCOUNTER — Ambulatory Visit: Payer: Self-pay | Admitting: *Deleted

## 2022-05-26 NOTE — Telephone Encounter (Signed)
Pt and husband called back. Pt is in a lot of pain and husband is frustrated with inability to help her.   I assured them both that notes were sent for Dr. Parks Ranger regarding this issue.   Please advise.

## 2022-05-26 NOTE — Telephone Encounter (Signed)
I sent the message to Dr. Raliegh Ip earlier today for further guidance. She has been seen at [facility] and she's scheduled with Erin Mecum on Friday. If her pain is too severe, she may need to go to the ED.

## 2022-05-26 NOTE — Telephone Encounter (Signed)
I have reviewed her urgent care visit note from 05/24/22, she has called and sent mychart message.  I have contacted [facility] and been advised by them that based on the size grade of the internal hemorrhoid she would need a General Surgery consult first instead of GI for banding.  We will ask patient if she will be okay to proceed w/ this approach and recommendation. I can place urgent referral to [facility] once I hear back from patient.  She has apt on Fri 9/8 with Junie Panning Mecum PA here at our office. That is soonest available  She has already been correctly advised to seek care back at hospital ED or UC if needed for severe symptoms.  [name], [facility] 05/26/2022, 4:40 PM

## 2022-05-26 NOTE — Telephone Encounter (Signed)
  Chief Complaint: Rectal Pain Symptoms: "Hemorrhoid, coming in and out, extremely painful." 12/10 pain. No bleeding. Went to UC, RX hydrocortisone supp, ineffective. Has tried witch hazel, sitz baths, OTC Prep H. States occurred after episodes of diarrhea Friday, no diarrhea presently.  Frequency: Friday Pertinent Negatives: Patient denies bleeding, abdominal pain Disposition: '[]'$ ED /'[]'$ Urgent Care (no appt availability in office) / '[]'$ Appointment(In office/virtual)/ '[]'$  [facility] [facility]/ '[]'$ Home Care/ '[]'$ Refused Recommended Disposition /'[]'$ [place] [facility]/ '[x]'$  Follow-up with PCP Additional Notes: Pt sounds distressed, tearful. States "I tuck it back in, comes back out." No availability at practice, already seen at [facility]. Care advise provided. Assured NT would route to practice for PCPs review and final disposition. Called during practice's lunch break. Please advise. Reason for Disposition  MODERATE-SEVERE rectal pain (i.e., interferes with school, work, or sleep)  Answer Assessment - Initial Assessment Questions 1. SYMPTOM:  "What's the main symptom you're concerned about?" (e.g., pain, itching, swelling, rash)     Rectal pain, hemorrhoids. 2. ONSET: "When did the start?"     Friday 3. RECTAL PAIN: "Do you have any pain around your rectum?" "How bad is the pain?"  (Scale 0-10; or mild, moderate, severe)   - NONE (0): no pain   - MILD (1-3): doesn't interfere with normal activities    - MODERATE (4-7): interferes with normal activities or awakens from sleep, limping    - SEVERE (8-10): excruciating pain, unable to have a bowel movement      12/10 4. RECTAL ITCHING: "Do you have any itching in this area?" "How bad is the itching?"  (Scale 0-10; or mild, moderate, severe)   - NONE: no itching   - MILD: doesn't interfere with normal activities    - MODERATE-SEVERE: interferes with normal activities or awakens from sleep     No 5. CONSTIPATION: "Do you have constipation?" If Yes, ask:  "How bad is it?"     No 6. CAUSE: "What do you think is causing the anus symptoms?"     Hemorrhoids. 7. OTHER SYMPTOMS: "Do you have any other symptoms?"  (e.g., abdomen pain, fever, rectal bleeding, vomiting)     Nausea from pain had diarrhea when occurred.  Protocols used: Rectal Symptoms-A-AH

## 2022-05-28 ENCOUNTER — Encounter: Payer: Self-pay | Admitting: Family Medicine

## 2022-05-28 DIAGNOSIS — K645 Perianal venous thrombosis: Secondary | ICD-10-CM | POA: Insufficient documentation

## 2022-05-29 ENCOUNTER — Ambulatory Visit: Payer: 59 | Admitting: Internal Medicine

## 2022-05-29 ENCOUNTER — Ambulatory Visit: Payer: 59 | Admitting: Physician Assistant

## 2022-06-22 DIAGNOSIS — R109 Unspecified abdominal pain: Secondary | ICD-10-CM | POA: Diagnosis not present

## 2022-06-22 DIAGNOSIS — K640 First degree hemorrhoids: Secondary | ICD-10-CM | POA: Diagnosis not present

## 2022-06-22 DIAGNOSIS — R5383 Other fatigue: Secondary | ICD-10-CM | POA: Diagnosis not present

## 2022-06-26 ENCOUNTER — Telehealth: Payer: 59 | Admitting: Family Medicine

## 2022-07-07 DIAGNOSIS — K640 First degree hemorrhoids: Secondary | ICD-10-CM | POA: Diagnosis not present

## 2022-07-13 ENCOUNTER — Encounter: Payer: Self-pay | Admitting: Family Medicine

## 2022-07-13 DIAGNOSIS — K643 Fourth degree hemorrhoids: Secondary | ICD-10-CM

## 2022-07-14 ENCOUNTER — Telehealth: Payer: Self-pay

## 2022-07-14 DIAGNOSIS — K644 Residual hemorrhoidal skin tags: Secondary | ICD-10-CM

## 2022-07-14 DIAGNOSIS — K643 Fourth degree hemorrhoids: Secondary | ICD-10-CM

## 2022-07-14 NOTE — Telephone Encounter (Signed)
Copied from [place] [id number]. Topic: Referral - Request for Referral >> Jul 14, 2022 11:45 AM Chapman Fitch wrote: Has patient seen PCP for this complaint? Yes.   *If NO, is insurance requiring patient see PCP for this issue before PCP can refer them? Referral for which specialty:  Preferred provider/office: [facility] [place] [facility] in [place]  Reason for referral: pt has been having an issue with Hemorrhoids

## 2022-07-14 NOTE — Telephone Encounter (Signed)
Referral submitted to [facility].  Previously was sent to Gen Surg, now she is requesting Colorectal specialist in [place].  New referral entered  [name], [facility] 07/14/2022, 6:39 PM

## 2022-07-15 ENCOUNTER — Telehealth: Payer: Self-pay | Admitting: Family Medicine

## 2022-07-16 ENCOUNTER — Telehealth: Payer: Self-pay

## 2022-07-16 ENCOUNTER — Encounter: Payer: Self-pay | Admitting: Family Medicine

## 2022-07-16 NOTE — Telephone Encounter (Signed)
Copied from [place] [id number]. Topic: Referral - Request for Referral >> Jul 16, 2022  2:34 PM Cyndi Bender wrote: Has patient seen PCP for this complaint? Yes.   *If NO, is insurance requiring patient see PCP for this issue before PCP can refer them? Referral for which specialty: GI Preferred provider/office: [facility] Dr. Carol Ada ph# [phone]  fax# [phone] Reason for referral: Pt stated she does not want to go to the location that she was referred to she prefers to go to [facility]

## 2022-07-16 NOTE — Telephone Encounter (Signed)
Duplicate request. Patient has already sent mychart message on this.  [name], [facility] 07/16/2022, 5:38 PM

## 2022-07-16 NOTE — Telephone Encounter (Signed)
Please review.  KP

## 2022-07-22 DIAGNOSIS — K6289 Other specified diseases of anus and rectum: Secondary | ICD-10-CM | POA: Diagnosis not present

## 2022-07-22 DIAGNOSIS — R634 Abnormal weight loss: Secondary | ICD-10-CM | POA: Diagnosis not present

## 2022-07-22 DIAGNOSIS — K600 Acute anal fissure: Secondary | ICD-10-CM | POA: Diagnosis not present

## 2022-07-26 ENCOUNTER — Telehealth: Payer: Self-pay | Admitting: Nurse Practitioner

## 2022-07-26 NOTE — Telephone Encounter (Signed)
Patient called. Being treated by Dr. Benson Norway for anal fissure. Was doing well on Diltiazem + lidocaine ointment and metamucil but then had a difficult time expelling a stool today and the rectal pain is excruciating.   Recommended Recticare in between applications of Diltiazem. Warm tub soaks. Can add a stool softener at bedtime if needed. Do not strain. Can use Tylenol if needed. Can use a glycerin supp as needed if having difficult evacuation. Also reviewed toileting techniques such as elevating legs on stool during BM.   Advised to call Dr. Ulyses Amor office tomorrow if not doing better. She wanted advice about taking a valium for the pain. Her SBP is reportedly in the 90's  right now. I advised her against taking the Valium out of concern for further hypotension.

## 2022-07-28 DIAGNOSIS — K600 Acute anal fissure: Secondary | ICD-10-CM | POA: Diagnosis not present

## 2022-07-31 ENCOUNTER — Inpatient Hospital Stay: Admission: RE | Admit: 2022-07-31 | Payer: 59 | Source: Ambulatory Visit

## 2022-08-03 ENCOUNTER — Encounter: Admission: RE | Payer: Self-pay | Source: Ambulatory Visit

## 2022-08-03 ENCOUNTER — Ambulatory Visit: Admission: RE | Admit: 2022-08-03 | Payer: 59 | Source: Ambulatory Visit | Admitting: General Surgery

## 2022-08-03 SURGERY — EXAM UNDER ANESTHESIA, RECTUM
Anesthesia: General | Site: Rectum

## 2022-08-04 ENCOUNTER — Ambulatory Visit: Payer: Self-pay | Admitting: Surgery

## 2022-08-04 DIAGNOSIS — R69 Illness, unspecified: Secondary | ICD-10-CM | POA: Diagnosis not present

## 2022-08-04 DIAGNOSIS — F419 Anxiety disorder, unspecified: Secondary | ICD-10-CM | POA: Insufficient documentation

## 2022-08-04 DIAGNOSIS — K6289 Other specified diseases of anus and rectum: Secondary | ICD-10-CM | POA: Diagnosis not present

## 2022-08-04 DIAGNOSIS — K648 Other hemorrhoids: Secondary | ICD-10-CM | POA: Diagnosis not present

## 2022-08-04 DIAGNOSIS — R634 Abnormal weight loss: Secondary | ICD-10-CM | POA: Diagnosis not present

## 2022-08-04 DIAGNOSIS — K645 Perianal venous thrombosis: Secondary | ICD-10-CM | POA: Diagnosis not present

## 2022-08-05 ENCOUNTER — Encounter: Payer: Self-pay | Admitting: Family Medicine

## 2022-08-05 ENCOUNTER — Ambulatory Visit: Payer: Self-pay | Admitting: General Surgery
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
## 2022-08-07 ENCOUNTER — Ambulatory Visit: Payer: 59 | Admitting: Family Medicine

## 2022-08-07 ENCOUNTER — Encounter: Payer: Self-pay | Admitting: Family Medicine

## 2022-08-07 VITALS — HR 95 | Ht 67.0 in | Wt 110.0 lb

## 2022-08-07 DIAGNOSIS — F411 Generalized anxiety disorder: Secondary | ICD-10-CM

## 2022-08-07 DIAGNOSIS — F410 Panic disorder [episodic paroxysmal anxiety] without agoraphobia: Secondary | ICD-10-CM | POA: Diagnosis not present

## 2022-08-07 DIAGNOSIS — R69 Illness, unspecified: Secondary | ICD-10-CM | POA: Diagnosis not present

## 2022-08-07 MED ORDER — DIAZEPAM 5 MG PO TABS: 5.0000 mg | ORAL_TABLET | Freq: Two times a day (BID) | ORAL | 0 refills | Status: DC | PRN

## 2022-08-07 NOTE — Progress Notes (Signed)
Subjective:    Patient ID: Lori Davies, female    DOB: 12-28-1970, 50 y.o.   MRN: [id number]  Lori Davies is a 51 y.o. female presenting on 08/07/2022 for Anxiety   HPI  GAD, with panic attacks, chronic Previously discussed initially 2019, had chronic issue, episodic flares of anxiety and panic. She had been on Wellbutrin, Sertraline, Escitalopram, Buspar. Currently taking Sertraline 38m daily  Currently major issue now with recent worsening symptoms with hemorrhoids and pain and worsening her anxiety  She has tried a Valium before AS NEEDED with very good results calming panic and anxiety and did not need to repeat dose for few days.  Upcoming Hemorrh[facility]  She is interested in course of valium to help with her anxiety. She has not been on BDZ long term in the past.  She has not setup with psychiatry or therapist either.      08/07/2022   10:06 AM 04/20/2022   11:19 AM 03/31/2021    1:22 PM  Depression screen PHQ 2/9  Decreased Interest 3 0 0  Down, Depressed, Hopeless 3 0 0  PHQ - 2 Score 6 0 0  Altered sleeping 2 0 0  Tired, decreased energy 3 1 0  Change in appetite 3 0 0  Feeling bad or failure about yourself  3 0 0  Trouble concentrating 3 0 0  Moving slowly or fidgety/restless 2 0 0  Suicidal thoughts 2 0 0  PHQ-9 Score 24 1 0  Difficult doing work/chores Extremely dIfficult Not difficult at all Not difficult at all   [place]-Suicide Severity Rating Scale 1) Have you wished you were dead or wished you could go to sleep and not wake up? - Yes  2) Have you had any actual thoughts of killing yourself? - No  Skip questions 3,4, 5  6) Have you ever done anything, started to do anything, or prepared to do anything to end your life? - No      08/07/2022   10:06 AM 04/20/2022   11:19 AM 03/31/2021    1:22 PM 03/17/2021    9:28 AM  GAD 7 : Generalized Anxiety Score  Nervous, Anxious, on Edge 3 0 0 0   Control/stop worrying 3 0 0 0  Worry too much - different things 3 0 0 0  Trouble relaxing 3 0 0 0  Restless 3 0 0 0  Easily annoyed or irritable 3 0 0 0  Afraid - awful might happen 3 0 0 0  Total GAD 7 Score 21 0 0 0  Anxiety Difficulty Not difficult at all Not difficult at all Not difficult at all Not difficult at all      Social History   Tobacco Use   Smoking status: Former    Packs/day: 0.50    Years: 20.00    Total pack years: 10.00    Types: Cigarettes    Quit date: 03/21/2018    Years since quitting: 4.3   Smokeless tobacco: Former   Tobacco comments:    Few years had breaks in smoking  Vaping Use   Vaping Use: Never used  Substance Use Topics   Alcohol use: No    Alcohol/week: 0.0 standard drinks of alcohol   Drug use: No    Review of Systems Per HPI unless specifically indicated above     Objective:    Pulse 95   Ht _0  (1.702 m)   Wt 110 lb (  49.9 kg)   LMP  (LMP Unknown)   SpO2 100%   BMI 17.23 kg/m   Wt Readings from Last 3 Encounters:  08/07/22 110 lb (49.9 kg)  04/20/22 133 lb (60.3 kg)  03/31/21 127 lb 9.6 oz (57.9 kg)    Physical Exam Vitals and nursing note reviewed.  Constitutional:      General: She is not in acute distress.    Appearance: Normal appearance. She is well-developed. She is not diaphoretic.     Comments: Well-appearing, comfortable, cooperative  HENT:     Head: Normocephalic and atraumatic.  Eyes:     General:        Right eye: No discharge.        Left eye: No discharge.     Conjunctiva/sclera: Conjunctivae normal.  Cardiovascular:     Rate and Rhythm: Normal rate.  Pulmonary:     Effort: Pulmonary effort is normal.  Skin:    General: Skin is warm and dry.     Findings: No erythema or rash.  Neurological:     Mental Status: She is alert and oriented to person, place, and time.  Psychiatric:        Mood and Affect: Mood normal.        Behavior: Behavior normal.        Thought Content: Thought content  normal.     Comments: Well groomed, good eye contact, normal speech and thoughts. Anxious today.     Results for orders placed or performed in visit on 04/02/21  VITAMIN D 25 Hydroxy (Vit-D Deficiency, Fractures)  Result Value Ref Range   Vit D, 25-Hydroxy 33 30 - 100 ng/mL  TSH  Result Value Ref Range   TSH 3.05 mIU/L  Hemoglobin A1c  Result Value Ref Range   Hgb A1c MFr Bld 6.3 (H) <5.7 % of total Hgb   Mean Plasma Glucose 134 mg/dL   eAG (mmol/L) 7.4 mmol/L  CBC with Differential/Platelet  Result Value Ref Range   WBC 5.9 3.8 - 10.8 Thousand/uL   RBC 5.06 3.80 - 5.10 Million/uL   Hemoglobin 14.3 11.7 - 15.5 g/dL   HCT 44.9 35.0 - 45.0 %   MCV 88.7 80.0 - 100.0 fL   MCH 28.3 27.0 - 33.0 pg   MCHC 31.8 (L) 32.0 - 36.0 g/dL   RDW 12.7 11.0 - 15.0 %   Platelets 315 140 - 400 Thousand/uL   MPV 8.6 7.5 - 12.5 fL   Neutro Abs 3,280 1,500 - 7,800 cells/uL   Lymphs Abs 1,782 850 - 3,900 cells/uL   Absolute Monocytes 460 200 - 950 cells/uL   Eosinophils Absolute 325 15 - 500 cells/uL   Basophils Absolute 53 0 - 200 cells/uL   Neutrophils Relative % 55.6 %   Total Lymphocyte 30.2 %   Monocytes Relative 7.8 %   Eosinophils Relative 5.5 %   Basophils Relative 0.9 %  Lipid panel  Result Value Ref Range   Cholesterol 219 (H) <200 mg/dL   HDL 66 > OR = 50 mg/dL   Triglycerides 151 (H) <150 mg/dL   LDL Cholesterol (Calc) 127 (H) mg/dL (calc)   Total CHOL/HDL Ratio 3.3 <5.0 (calc)   Non-HDL Cholesterol (Calc) 153 (H) <130 mg/dL (calc)  COMPLETE METABOLIC PANEL WITH GFR  Result Value Ref Range   Glucose, Bld 104 (H) 65 - 99 mg/dL   BUN 13 7 - 25 mg/dL   Creat 0.83 0.50 - 1.03 mg/dL   eGFR 85 > OR =  60 mL/min/1.105m   BUN/Creatinine Ratio NOT APPLICABLE 6 - 22 (calc)   Sodium 140 135 - 146 mmol/L   Potassium 4.5 3.5 - 5.3 mmol/L   Chloride 106 98 - 110 mmol/L   CO2 26 20 - 32 mmol/L   Calcium 10.1 8.6 - 10.4 mg/dL   Total Protein 7.3 6.1 - 8.1 g/dL   Albumin 4.5 3.6 - 5.1  g/dL   Globulin 2.8 1.9 - 3.7 g/dL (calc)   AG Ratio 1.6 1.0 - 2.5 (calc)   Total Bilirubin 0.6 0.2 - 1.2 mg/dL   Alkaline phosphatase (APISO) 74 37 - 153 U/L   AST 15 10 - 35 U/L   ALT 11 6 - 29 U/L      Assessment & Plan:   Problem List Items Addressed This Visit   None Visit Diagnoses     Generalized anxiety disorder with panic attacks    -  Primary   Relevant Medications   diazepam (VALIUM) 5 MG tablet      Significant worsening anxiety with panic attacks lately with hemorrhoid/rectal pain and stressors with worsening acute on chronic anxiety and panic attacks Long history, reviewed past meds. Failed Wellbutrin, SSRI Sertraline, Lexapro, Buspar  She has tolerated Diazepam x 1 dose recently and did very well.  We discussed BDZ usage for safety and benefits risks and dependence. She understands the risks and is agreeable to proceed short term on this therapy to get through upcoming surgery and medical and life stressors.  We agreed to Diazepam 543mTWICE A DAY AS NEEDED short term course #30 pills, 0 refills  Follow up with me in 4-6 weeks to re-eval and adjust.  Discussed that I would prefer to switch to Lorazepam vs CLonazepam going forward if need longer term AS NEEDED treatment.  I gave her AVS with Mental health referral list. In order to manage her anxiety at this point going forward, I recommend she needs to establish with therapist.      Meds ordered this encounter  Medications   diazepam (VALIUM) 5 MG tablet    Sig: Take 1 tablet (5 mg total) by mouth every 12 (twelve) hours as needed for anxiety.    Dispense:  30 tablet    Refill:  0    Follow up plan: Return in about 4 weeks (around 09/04/2022) for 4 weeks follow-up anxiety med.   AlNobie PutnamDOEmmetedical Group 08/07/2022, 10:12 AM

## 2022-08-07 NOTE — Patient Instructions (Addendum)
Thank you for coming to the office today.  STart the Diazepam '5mg'$  as needed max every 12 hours for anxiety / panic attack May adjust accordingly within next few weeks and consider switching to Lorapzeam or Clonazepam  Let me know if locate or therapist or psychiatry office.   These offices have both PSYCHIATRY doctors and [place] (Virtual Available) [facility] [address] [place], [place] [address] Phone: [phone]  [facility] Address: [address] [place], [place] [address] [email or web address] Phone: [phone]  [facility] [place] ([place] [facility]) Address: [address], [place], [place] [address] Hours: 8:30AM-5PM Phone: [phone]  [facility] (Adult, [place], [facility], [facility]ing) [address], [place], [place] [address] Phone: [phone] Fax: [phone]  [facility] [place], [place] [address] Phone: [phone]  [facility] (All ages) [address], [place], [id number] Phone: [phone] (Option 1) [email or web address]  ----------------------------------------------------------------- THERAPIST ONLY  (No Psychiatry)  [facility] [address], [place] [address] [place] P: [phone]  [facility]) [facility], [facility] [address] [place], [place] [address] [phone]  [place].   Address: [address] [place], [place] [address] Hours: Open today  9AM-7PM Phone: [phone]  [facility] [address] [place] Address: [address] [place], [place], [place] [address] Phone: [phone]  Please schedule a Follow-up Appointment to: Return in about 4 weeks (around 09/04/2022) for 4 weeks follow-up anxiety med.  If you  have any other questions or concerns, please feel free to call the office or send a message through [facility]. You may also schedule an earlier appointment if necessary.  Additionally, you may be receiving a survey about your experience at our office within a few days to 1 week by e-mail or mail. We value your feedback.  [name], DO [place]

## 2022-08-10 ENCOUNTER — Encounter
Admission: RE | Admit: 2022-08-10 | Discharge: 2022-08-10 | Disposition: A | Discharge status: Home / Self Care | Payer: 59 | Source: Ambulatory Visit | Attending: General Surgery | Admitting: General Surgery

## 2022-08-10 HISTORY — DX: Prediabetes: R73.03

## 2022-08-10 HISTORY — DX: Other hemorrhoids: K64.8

## 2022-08-10 HISTORY — DX: Personal history of urinary calculi: Z87.442

## 2022-08-10 HISTORY — DX: Chronic anal fissure: K60.1

## 2022-08-10 HISTORY — DX: Anxiety disorder, unspecified: F41.9

## 2022-08-10 HISTORY — DX: Perianal venous thrombosis: K64.5

## 2022-08-10 NOTE — Patient Instructions (Addendum)
Your procedure is scheduled on: Wednesday, November 22 Report to the Registration Desk on the 1st floor of the [facility]. To find out your arrival time, please call [phone] between 1PM - 3PM on: Tuesday, November 21 If your arrival time is 6:00 am, do not arrive prior to that time as the [facility] entrance doors do not open until 6:00 am.  REMEMBER: Instructions that are not followed completely may result in serious medical risk, up to and including death; or upon the discretion of your surgeon and anesthesiologist your surgery may need to be rescheduled.  Do not eat food after midnight the night before surgery.  No gum chewing, lozengers or hard candies.  You may however, drink water up to 2 hours before you are scheduled to arrive for your surgery. Do not drink anything within 2 hours of your scheduled arrival time.  TAKE THESE MEDICATIONS THE MORNING OF SURGERY WITH A SIP OF WATER:  Diazepam (Valium) if needed for anxiety  One week prior to surgery: starting today, November 20 Stop Anti-inflammatories (NSAIDS) such as Advil, Aleve, Ibuprofen, Motrin, Naproxen, Naprosyn and Aspirin based products such as Excedrin, Goodys Powder, BC Powder. Stop ANY OVER THE COUNTER supplements until after surgery. You may however, continue to take Tylenol if needed for pain up until the day of surgery.  No Alcohol for 24 hours before or after surgery.  No Smoking including e-cigarettes for 24 hours prior to surgery.  No chewable tobacco products for at least 6 hours prior to surgery.  No nicotine patches on the day of surgery.  Do not use any "recreational" drugs for at least a week prior to your surgery.  Please be advised that the combination of cocaine and anesthesia may have negative outcomes, up to and including death. If you test positive for cocaine, your surgery will be cancelled.  On the morning of surgery brush your teeth with toothpaste and water, you may rinse your mouth  with mouthwash if you wish. Do not swallow any toothpaste or mouthwash.  Do not wear jewelry, make-up, hairpins, clips or nail polish.  Do not wear lotions, powders, or perfumes.   Do not shave body from the neck down 48 hours prior to surgery just in case you cut yourself which could leave a site for infection.   Contact lenses, hearing aids and dentures may not be worn into surgery.  Do not bring valuables to the hospital. [facility] is not responsible for any missing/lost belongings or valuables.   Notify your doctor if there is any change in your medical condition (cold, fever, infection).  Wear comfortable clothing (specific to your surgery type) to the hospital.  After surgery, you can help prevent lung complications by doing breathing exercises.  Take deep breaths and cough every 1-2 hours. Your doctor may order a device called an Incentive Spirometer to help you take deep breaths.  If you are being discharged the day of surgery, you will not be allowed to drive home. You will need a responsible adult (18 years or older) to drive you home and stay with you that night.   If you are taking public transportation, you will need to have a responsible adult (18 years or older) with you. Please confirm with your physician that it is acceptable to use public transportation.   Please call the [facility] Dept. at [phone] if you have any questions about these instructions.  Surgery Visitation Policy:  Patients undergoing a surgery or procedure may  have two family members or support persons with them as long as the person is not COVID-19 positive or experiencing its symptoms.

## 2022-08-12 ENCOUNTER — Ambulatory Visit: Payer: 59 | Admitting: Certified Registered Nurse Anesthetist

## 2022-08-12 ENCOUNTER — Encounter: Payer: Self-pay | Admitting: General Surgery

## 2022-08-12 ENCOUNTER — Other Ambulatory Visit: Payer: Self-pay

## 2022-08-12 ENCOUNTER — Encounter
Admission: RE | Disposition: A | Discharge status: Home / Self Care | Payer: Self-pay | Source: Home / Self Care | Attending: General Surgery

## 2022-08-12 ENCOUNTER — Ambulatory Visit
Admission: RE | Admit: 2022-08-12 | Discharge: 2022-08-12 | Disposition: A | Discharge status: Home / Self Care | Payer: 59 | Attending: General Surgery | Admitting: General Surgery

## 2022-08-12 DIAGNOSIS — J449 Chronic obstructive pulmonary disease, unspecified: Secondary | ICD-10-CM | POA: Insufficient documentation

## 2022-08-12 DIAGNOSIS — K601 Chronic anal fissure: Secondary | ICD-10-CM | POA: Insufficient documentation

## 2022-08-12 DIAGNOSIS — R69 Illness, unspecified: Secondary | ICD-10-CM | POA: Diagnosis not present

## 2022-08-12 DIAGNOSIS — K602 Anal fissure, unspecified: Secondary | ICD-10-CM | POA: Diagnosis present

## 2022-08-12 DIAGNOSIS — K640 First degree hemorrhoids: Secondary | ICD-10-CM | POA: Insufficient documentation

## 2022-08-12 DIAGNOSIS — K600 Acute anal fissure: Secondary | ICD-10-CM | POA: Diagnosis not present

## 2022-08-12 DIAGNOSIS — Z87891 Personal history of nicotine dependence: Secondary | ICD-10-CM | POA: Diagnosis not present

## 2022-08-12 HISTORY — PX: BOTOX INJECTION: SHX5754

## 2022-08-12 SURGERY — EXAM UNDER ANESTHESIA
Anesthesia: General | Site: Rectum

## 2022-08-12 MED ORDER — LACTATED RINGERS IV SOLN: INTRAVENOUS | Status: DC

## 2022-08-12 MED ORDER — MIDAZOLAM HCL 2 MG/2ML IJ SOLN
INTRAMUSCULAR | Status: AC
  Filled 2022-08-12: qty 2

## 2022-08-12 MED ORDER — ACETAMINOPHEN 500 MG PO TABS
ORAL_TABLET | ORAL | Status: AC
  Administered 2022-08-12: 1000 mg via ORAL
  Filled 2022-08-12: qty 2

## 2022-08-12 MED ORDER — ACETAMINOPHEN 500 MG PO TABS: 1000.0000 mg | ORAL_TABLET | ORAL | Status: AC

## 2022-08-12 MED ORDER — HYDROCODONE-ACETAMINOPHEN 5-325 MG PO TABS: 1.0000 | ORAL_TABLET | ORAL | 0 refills | Status: AC | PRN

## 2022-08-12 MED ORDER — CHLORHEXIDINE GLUCONATE CLOTH 2 % EX PADS
6.0000 | MEDICATED_PAD | Freq: Once | CUTANEOUS | Status: AC
  Administered 2022-08-12: 6 via TOPICAL

## 2022-08-12 MED ORDER — DEXAMETHASONE SODIUM PHOSPHATE 10 MG/ML IJ SOLN
INTRAMUSCULAR | Status: AC
  Filled 2022-08-12: qty 1

## 2022-08-12 MED ORDER — PROPOFOL 10 MG/ML IV BOLUS
INTRAVENOUS | Status: AC
  Filled 2022-08-12: qty 20

## 2022-08-12 MED ORDER — CHLORHEXIDINE GLUCONATE 0.12 % MT SOLN: 15.0000 mL | Freq: Once | OROMUCOSAL | Status: AC

## 2022-08-12 MED ORDER — SODIUM CHLORIDE 0.9 % IV SOLN
2.0000 g | INTRAVENOUS | Status: AC
  Administered 2022-08-12: 1 g via INTRAVENOUS
  Filled 2022-08-12: qty 2

## 2022-08-12 MED ORDER — SODIUM CHLORIDE (PF) 0.9 % IJ SOLN
INTRAMUSCULAR | Status: AC
  Filled 2022-08-12: qty 10

## 2022-08-12 MED ORDER — CEFAZOLIN SODIUM-DEXTROSE 2-4 GM/100ML-% IV SOLN
INTRAVENOUS | Status: AC
  Filled 2022-08-12: qty 100

## 2022-08-12 MED ORDER — ONDANSETRON HCL 4 MG/2ML IJ SOLN
INTRAMUSCULAR | Status: AC
  Filled 2022-08-12: qty 2

## 2022-08-12 MED ORDER — MIDAZOLAM HCL 2 MG/2ML IJ SOLN
INTRAMUSCULAR | Status: DC | PRN
  Administered 2022-08-12 (×2): 1 mg via INTRAVENOUS

## 2022-08-12 MED ORDER — GABAPENTIN 300 MG PO CAPS
ORAL_CAPSULE | ORAL | Status: AC
  Administered 2022-08-12: 300 mg via ORAL
  Filled 2022-08-12: qty 1

## 2022-08-12 MED ORDER — OXYCODONE HCL 5 MG PO TABS: 5.0000 mg | ORAL_TABLET | Freq: Once | ORAL | Status: DC | PRN

## 2022-08-12 MED ORDER — PROPOFOL 500 MG/50ML IV EMUL
INTRAVENOUS | Status: DC | PRN
  Administered 2022-08-12: 85 ug/kg/min via INTRAVENOUS

## 2022-08-12 MED ORDER — FENTANYL CITRATE (PF) 100 MCG/2ML IJ SOLN
INTRAMUSCULAR | Status: AC
  Filled 2022-08-12: qty 2

## 2022-08-12 MED ORDER — CELECOXIB 200 MG PO CAPS: 200.0000 mg | ORAL_CAPSULE | ORAL | Status: AC

## 2022-08-12 MED ORDER — OXYCODONE HCL 5 MG/5ML PO SOLN: 5.0000 mg | Freq: Once | ORAL | Status: DC | PRN

## 2022-08-12 MED ORDER — FAMOTIDINE 20 MG PO TABS: 20.0000 mg | ORAL_TABLET | Freq: Once | ORAL | Status: AC

## 2022-08-12 MED ORDER — LIDOCAINE HCL (CARDIAC) PF 100 MG/5ML IV SOSY
PREFILLED_SYRINGE | INTRAVENOUS | Status: DC | PRN
  Administered 2022-08-12: 50 mg via INTRAVENOUS

## 2022-08-12 MED ORDER — PHENYLEPHRINE HCL-NACL 20-0.9 MG/250ML-% IV SOLN
INTRAVENOUS | Status: AC
  Filled 2022-08-12: qty 250

## 2022-08-12 MED ORDER — BUPIVACAINE LIPOSOME 1.3 % IJ SUSP
INTRAMUSCULAR | Status: DC | PRN
  Administered 2022-08-12: 20 mL

## 2022-08-12 MED ORDER — BUPIVACAINE-EPINEPHRINE (PF) 0.5% -1:200000 IJ SOLN
INTRAMUSCULAR | Status: AC
  Filled 2022-08-12: qty 30

## 2022-08-12 MED ORDER — FENTANYL CITRATE (PF) 100 MCG/2ML IJ SOLN
INTRAMUSCULAR | Status: DC | PRN
  Administered 2022-08-12: 25 ug via INTRAVENOUS

## 2022-08-12 MED ORDER — LIDOCAINE HCL (PF) 2 % IJ SOLN
INTRAMUSCULAR | Status: AC
  Filled 2022-08-12: qty 5

## 2022-08-12 MED ORDER — BUPIVACAINE LIPOSOME 1.3 % IJ SUSP: 20.0000 mL | Freq: Once | INTRAMUSCULAR | Status: DC

## 2022-08-12 MED ORDER — ENSURE PRE-SURGERY PO LIQD
296.0000 mL | Freq: Once | ORAL | Status: DC
  Filled 2022-08-12: qty 296

## 2022-08-12 MED ORDER — CHLORHEXIDINE GLUCONATE 0.12 % MT SOLN
OROMUCOSAL | Status: AC
  Administered 2022-08-12: 15 mL via OROMUCOSAL
  Filled 2022-08-12: qty 15

## 2022-08-12 MED ORDER — ORAL CARE MOUTH RINSE: 15.0000 mL | Freq: Once | OROMUCOSAL | Status: AC

## 2022-08-12 MED ORDER — FENTANYL CITRATE (PF) 100 MCG/2ML IJ SOLN: 25.0000 ug | INTRAMUSCULAR | Status: DC | PRN

## 2022-08-12 MED ORDER — CELECOXIB 200 MG PO CAPS
ORAL_CAPSULE | ORAL | Status: AC
  Administered 2022-08-12: 200 mg via ORAL
  Filled 2022-08-12: qty 1

## 2022-08-12 MED ORDER — ONABOTULINUMTOXINA 100 UNITS IJ SOLR
INTRAMUSCULAR | Status: DC | PRN
  Administered 2022-08-12: 100 [IU] via INTRAMUSCULAR

## 2022-08-12 MED ORDER — CHLORHEXIDINE GLUCONATE CLOTH 2 % EX PADS: 6.0000 | MEDICATED_PAD | Freq: Once | CUTANEOUS | Status: DC

## 2022-08-12 MED ORDER — GABAPENTIN 300 MG PO CAPS: 300.0000 mg | ORAL_CAPSULE | ORAL | Status: AC

## 2022-08-12 MED ORDER — ONDANSETRON HCL 4 MG/2ML IJ SOLN
INTRAMUSCULAR | Status: DC | PRN
  Administered 2022-08-12: 4 mg via INTRAVENOUS

## 2022-08-12 MED ORDER — FAMOTIDINE 20 MG PO TABS
ORAL_TABLET | ORAL | Status: AC
  Administered 2022-08-12: 20 mg via ORAL
  Filled 2022-08-12: qty 1

## 2022-08-12 MED ORDER — BUPIVACAINE LIPOSOME 1.3 % IJ SUSP
INTRAMUSCULAR | Status: AC
  Filled 2022-08-12: qty 20

## 2022-08-12 MED ORDER — 0.9 % SODIUM CHLORIDE (POUR BTL) OPTIME
TOPICAL | Status: DC | PRN
  Administered 2022-08-12: 500 mL

## 2022-08-12 SURGICAL SUPPLY — 38 items
BLADE SURG 15 STRL LF DISP TIS (BLADE) ×2 IMPLANT
BLADE SURG 15 STRL SS (BLADE)
BRIEF MESH DISP 2XL (UNDERPADS AND DIAPERS) ×2 IMPLANT
DRAPE LAPAROTOMY 100X77 ABD (DRAPES) ×2 IMPLANT
DRAPE LEGGINS SURG 28X43 STRL (DRAPES) ×2 IMPLANT
DRAPE UNDER BUTTOCK W/FLU (DRAPES) ×2 IMPLANT
ELECT REM PT RETURN 9FT ADLT (ELECTROSURGICAL) ×1
ELECTRODE REM PT RTRN 9FT ADLT (ELECTROSURGICAL) ×2 IMPLANT
GAUZE 4X4 16PLY ~~LOC~~+RFID DBL (SPONGE) ×2 IMPLANT
GAUZE SPONGE 4X4 12PLY STRL (GAUZE/BANDAGES/DRESSINGS) ×2 IMPLANT
GLOVE BIO SURGEON STRL SZ 6.5 (GLOVE) ×2 IMPLANT
GLOVE BIOGEL PI IND STRL 6.5 (GLOVE) ×2 IMPLANT
GOWN STRL REUS W/ TWL LRG LVL3 (GOWN DISPOSABLE) ×4 IMPLANT
GOWN STRL REUS W/TWL LRG LVL3 (GOWN DISPOSABLE) ×2
HEMOSTAT SURGICEL 2X3 (HEMOSTASIS) IMPLANT
KIT TURNOVER CYSTO (KITS) ×2 IMPLANT
LABEL OR SOLS (LABEL) ×2 IMPLANT
MANIFOLD NEPTUNE II (INSTRUMENTS) ×2 IMPLANT
NEEDLE HYPO 22GX1.5 SAFETY (NEEDLE) ×2 IMPLANT
NS IRRIG 500ML POUR BTL (IV SOLUTION) ×2 IMPLANT
PACK BASIN MINOR ARMC (MISCELLANEOUS) ×2 IMPLANT
PAD ABD DERMACEA PRESS 5X9 (GAUZE/BANDAGES/DRESSINGS) ×2 IMPLANT
PAD PREP 24X41 OB/GYN DISP (PERSONAL CARE ITEMS) ×2 IMPLANT
SHEARS HARMONIC 9CM CVD (BLADE) IMPLANT
SOL PREP PVP 2OZ (MISCELLANEOUS) ×1
SOLUTION PREP PVP 2OZ (MISCELLANEOUS) ×2 IMPLANT
STAPLER PROXIMATE HCS (STAPLE) ×2 IMPLANT
SURGILUBE 2OZ TUBE FLIPTOP (MISCELLANEOUS) ×2 IMPLANT
SUT ETHILON 3-0 FS-10 30 BLK (SUTURE)
SUT VIC AB 2-0 SH 27 (SUTURE)
SUT VIC AB 2-0 SH 27XBRD (SUTURE) ×2 IMPLANT
SUT VIC AB 3-0 SH 27 (SUTURE)
SUT VIC AB 3-0 SH 27X BRD (SUTURE) ×2 IMPLANT
SUTURE EHLN 3-0 FS-10 30 BLK (SUTURE) IMPLANT
SYR 10ML LL (SYRINGE) ×2 IMPLANT
SYR BULB IRRIG 60ML STRL (SYRINGE) ×2 IMPLANT
TRAP FLUID SMOKE EVACUATOR (MISCELLANEOUS) ×2 IMPLANT
WATER STERILE IRR 500ML POUR (IV SOLUTION) ×2 IMPLANT

## 2022-08-12 NOTE — Anesthesia Postprocedure Evaluation (Signed)
Anesthesia Post Note  Patient: Lori Davies  Procedure(s) Performed: [name] UNDER ANESTHESIA (Rectum) BOTOX INJECTION (Rectum)  Patient location during evaluation: PACU Anesthesia Type: General Level of consciousness: awake and alert Pain management: pain level controlled Vital Signs Assessment: post-procedure vital signs reviewed and stable Respiratory status: spontaneous breathing, nonlabored ventilation, respiratory function stable and patient connected to nasal cannula oxygen Cardiovascular status: blood pressure returned to baseline and stable Postop Assessment: no apparent nausea or vomiting Anesthetic complications: no  No notable events documented.   Last Vitals:  Vitals:   08/12/22 1045 08/12/22 1102  BP: 96/64 105/72  Pulse: (!) 51 66  Resp: (!) 21 18  Temp: (!) 36.1 C (!) 36.4 C  SpO2: 98% 99%    Last Pain:  Vitals:   08/12/22 1102  TempSrc: Temporal  PainSc: 0-No pain                 Dimas Millin

## 2022-08-12 NOTE — Transfer of Care (Addendum)
Immediate Anesthesia Transfer of Care Note  Patient: Lori Davies  Procedure(s) Performed: [name] UNDER ANESTHESIA (Rectum) BOTOX INJECTION (Rectum)  Patient Location: PACU  Anesthesia Type:MAC  Level of Consciousness: drowsy  Airway & Oxygen Therapy: Patient Spontanous Breathing and Patient connected to face mask oxygen  Post-op Assessment: Report given to RN and Post -op Vital signs reviewed and stable  Post vital signs: Reviewed and stable  Last Vitals:  Vitals Value Taken Time  BP 106/65 08/12/22 1030  Temp 36.3 C 08/12/22 0930  Pulse 54 08/12/22 1035  Resp 21 08/12/22 1035  SpO2 96 % 08/12/22 1035  Vitals shown include unvalidated device data.  Last Pain:  Vitals:   08/12/22 1015  TempSrc:   PainSc: 0-No pain         Complications: No notable events documented.

## 2022-08-12 NOTE — Op Note (Signed)
Preoperative diagnosis: Anal fissure.   Postoperative diagnosis: Chronic anal fissure  Procedure: Anoscopy, chemodenervation of internal anal sphincter  Surgeon: Dr. Windell Moment  Anesthesia: MAC  Wound classification: Clean Contaminated  Indications: Patient is a 51 y.o. female was found to have severe peri anal pain unable to perform digital rectal exam or anoscopy without sedation.   Findings: 1. Chronic healing posterior anal fissure 2. Grade 1 internal hemorrhoids 3. No bleeding or fistulas identified.   Description of procedure: The patient was brought to the operating room and MAC was induced. Patient was placed in the lithotomy position. A time-out was completed verifying correct patient, procedure, site, positioning, and implant(s) and/or special equipment prior to beginning this procedure. The buttocks were taped apart.  The perineum was prepped and draped in standard sterile fashion. Local anesthetic (Exparel) was injected as a perianal block. An anoscope was introduced a posterior healing fissure was identified.  Using a 25 G needle, 100 IU of botox was injected circumferentially under direct visualization at the internal anal sphincter. No complications or other pathologies identified.  The patient tolerated the procedure well and was taken to the postanesthesia care unit in stable condition.   Specimen: None  Complications: None  EBL: None

## 2022-08-12 NOTE — Interval H&P Note (Signed)
History and Physical Interval Note:  08/12/2022 7:45 AM  Lori Davies  has presented today for surgery, with the diagnosis of K60.0 acute anal fissure.  The various methods of treatment have been discussed with the patient and family. After consideration of risks, benefits and other options for treatment, the patient has consented to  Procedure(s): EXAM UNDER ANESTHESIA (N/A) BOTOX INJECTION (N/A) as a surgical intervention.  The patient's history has been reviewed, patient examined, no change in status, stable for surgery.  I have reviewed the patient's chart and labs.  Questions were answered to the patient's satisfaction.     Herbert Pun

## 2022-08-12 NOTE — Discharge Instructions (Addendum)
  Diet: Resume home High fiber diet.   Activity: Increase activity as tolerated. Light activity and walking are encouraged. Do not drive or drink alcohol if taking narcotic pain medications.  Wound care: May shower normally.   Medications: Resume all home medications. For mild to moderate pain: acetaminophen (Tylenol) or ibuprofen (if no kidney disease). Combining Tylenol with alcohol can substantially increase your risk of causing liver disease. Narcotic pain medications, if prescribed, can be used for severe pain, though may cause nausea, constipation, and drowsiness. Do not combine Tylenol and Norco within a 6 hour period as Norco contains Tylenol. If you do not need the narcotic pain medication, you do not need to fill the prescription.  Call office [phone]) at any time if any questions, worsening pain, fevers/chills, bleeding, drainage from incision site, or other concerns.   AMBULATORY SURGERY  DISCHARGE INSTRUCTIONS   The drugs that you were given will stay in your system until tomorrow so for the next 24 hours you should not:  Drive an automobile Make any legal decisions Drink any alcoholic beverage   You may resume regular meals tomorrow.  Today it is better to start with liquids and gradually work up to solid foods.  You may eat anything you prefer, but it is better to start with liquids, then soup and crackers, and gradually work up to solid foods.   Please notify your doctor immediately if you have any unusual bleeding, trouble breathing, redness and pain at the surgery site, drainage, fever, or pain not relieved by medication.     Your post-operative visit with Dr.                                       is: Date:                        Time:    Please call to schedule your post-operative visit.  Additional Instructions:

## 2022-08-12 NOTE — Anesthesia Preprocedure Evaluation (Signed)
Anesthesia Evaluation  Patient identified by MRN, date of birth, ID band Patient awake    Reviewed: Allergy & Precautions, NPO status , Patient's Chart, lab work & pertinent test results  History of Anesthesia Complications Negative for: history of anesthetic complications  Airway Mallampati: I  TM Distance: >3 FB Neck ROM: full    Dental  (+) Chipped   Pulmonary COPD, former smoker   Pulmonary exam normal        Cardiovascular (-) hypertension(-) Past MI and (-) CABG negative cardio ROS Normal cardiovascular exam     Neuro/Psych  PSYCHIATRIC DISORDERS      negative neurological ROS     GI/Hepatic negative GI ROS, Neg liver ROS,,,  Endo/Other  negative endocrine ROS    Renal/GU negative Renal ROS  negative genitourinary   Musculoskeletal   Abdominal   Peds  Hematology negative hematology ROS (+)   Anesthesia Other Findings Past Medical History: No date: Anxiety No date: Chronic anal fissure 2016: COPD, mild ([place]) 2016: Ectopic pregnancy No date: History of kidney stones No date: Internal and external thrombosed hemorrhoids No date: Pre-diabetes  Past Surgical History: No date: ADENOIDECTOMY  BMI    Body Mass Index: 17.23 kg/m      Reproductive/Obstetrics negative OB ROS                             Anesthesia Physical Anesthesia Plan  ASA: 2  Anesthesia Plan: General   Post-op Pain Management: Minimal or no pain anticipated   Induction: Intravenous  PONV Risk Score and Plan: 3 and Propofol infusion, TIVA and Ondansetron  Airway Management Planned: Nasal Cannula  Additional Equipment: None  Intra-op Plan:   Post-operative Plan:   Informed Consent: I have reviewed the patients History and Physical, chart, labs and discussed the procedure including the risks, benefits and alternatives for the proposed anesthesia with the patient or authorized representative who has  indicated his/her understanding and acceptance.     Dental advisory given  Plan Discussed with: CRNA and Surgeon  Anesthesia Plan Comments: (Discussed risks of anesthesia with patient, including possibility of difficulty with spontaneous ventilation under anesthesia necessitating airway intervention, PONV, and rare risks such as cardiac or respiratory or neurological events, and allergic reactions. Discussed the role of CRNA in patient's perioperative care. Patient understands.)       Anesthesia Quick Evaluation

## 2022-08-16 ENCOUNTER — Telehealth: Payer: Self-pay | Admitting: Surgery

## 2022-08-16 NOTE — Telephone Encounter (Signed)
Returned a call to Lori Davies date of birth January 20/1972 at [id number] at her request.  She reports having Botox injection November 22.  She reports she has had recent onset of rectal/anal pain feeling a sudden fullness and a heaviness as though there was something to move although she is unable to pass any gas or stool to alleviate this discomfort. We spent a prolonged period of time talking about her bowel regimen she takes a half a cup of MiraLAX twice daily with fiber Gummies in the evening.  She reports her stools are usually soft to loose and at least 2 times a day.  She reports she has not missed any bowel movements since her procedure and she reports she did not have any perioperative enemas either. She feels she is maxed out her pain medication by mouth with her oxycodone, would like to take some Valium if it would be helpful. As her pain has apparently an 11 out of 10, I thought she would need to visit the ED being in such severe discomfort.  However she chose to opt out and contact the office in the morning to see if she can be evaluated or discuss her current discomfort at that time. Once again we reviewed her bowel regimen and fiber supplementation and, she thought maybe she would benefit by reapplying some diltiazem or nitroglycerin. Hopefully the Valium will help but she will be able to rest well tonight.

## 2022-08-17 ENCOUNTER — Encounter: Payer: Self-pay | Admitting: Family Medicine

## 2022-08-17 DIAGNOSIS — F410 Panic disorder [episodic paroxysmal anxiety] without agoraphobia: Secondary | ICD-10-CM

## 2022-08-18 MED ORDER — LORAZEPAM 1 MG PO TABS: 0.5000 mg | ORAL_TABLET | Freq: Three times a day (TID) | ORAL | 0 refills | Status: DC

## 2022-08-18 NOTE — Addendum Note (Signed)
Addended by: Olin Hauser on: 08/18/2022 01:08 PM   Modules accepted: Orders

## 2022-08-25 ENCOUNTER — Encounter: Payer: Self-pay | Admitting: Family Medicine

## 2022-08-25 DIAGNOSIS — K594 Anal spasm: Secondary | ICD-10-CM

## 2022-08-25 DIAGNOSIS — F410 Panic disorder [episodic paroxysmal anxiety] without agoraphobia: Secondary | ICD-10-CM

## 2022-08-26 ENCOUNTER — Telehealth: Payer: 59 | Admitting: Family Medicine

## 2022-08-26 ENCOUNTER — Encounter: Payer: Self-pay | Admitting: Family Medicine

## 2022-08-26 VITALS — Wt 110.0 lb

## 2022-08-26 DIAGNOSIS — F331 Major depressive disorder, recurrent, moderate: Secondary | ICD-10-CM | POA: Diagnosis not present

## 2022-08-26 DIAGNOSIS — F410 Panic disorder [episodic paroxysmal anxiety] without agoraphobia: Secondary | ICD-10-CM | POA: Diagnosis not present

## 2022-08-26 DIAGNOSIS — R69 Illness, unspecified: Secondary | ICD-10-CM | POA: Diagnosis not present

## 2022-08-26 DIAGNOSIS — F411 Generalized anxiety disorder: Secondary | ICD-10-CM | POA: Diagnosis not present

## 2022-08-26 MED ORDER — DIAZEPAM 5 MG PO TABS: 5.0000 mg | ORAL_TABLET | Freq: Two times a day (BID) | ORAL | 0 refills | Status: DC | PRN

## 2022-08-26 NOTE — Patient Instructions (Signed)
° °  Please schedule a Follow-up Appointment to: No follow-ups on file. ° °If you have any other questions or concerns, please feel free to call the office or send a message through MyChart. You may also schedule an earlier appointment if necessary. ° °Additionally, you may be receiving a survey about your experience at our office within a few days to 1 week by e-mail or mail. We value your feedback. ° °Reilly Molchan, DO °[facility] °

## 2022-08-26 NOTE — Progress Notes (Signed)
Subjective:    Patient ID: Lori Davies, female    DOB: [birth year], 52 y.o.   MRN: [id number]  Lori Davies is a 51 y.o. female presenting on 08/26/2022 for Anxiety  Virtual / Telehealth Encounter - Video Visit via MyChart The purpose of this virtual visit is to provide medical care while limiting exposure to the novel coronavirus (COVID19) for both patient and office staff.  Consent was obtained for remote visit:  Yes.   Answered questions that patient had about telehealth interaction:  Yes.   I discussed the limitations, risks, security and privacy concerns of performing an evaluation and management service by video/telephone. I also discussed with the patient that there may be a patient responsible charge related to this service. The patient expressed understanding and agreed to proceed.  Patient Location: Home Provider Location: [facility] (Office)  Participants in virtual visit: - Patient: Lori Davies - CMA: Orinda Kenner, CMA - Provider: Dr Parks Ranger   HPI  Generalized Anxiety with Panic Attacks Recurrent Major Depression moderate Rectal muscle spasm s/p anal fissure procedure 08/12/22 Gen Surgery  Was originally on Valium Diazepam 23m AS NEEDED by surgery, then we switched to Lorazepam for anxiety instead for AS NEEDED, but now surgery recommended back to Diazepam for better muscle relaxant results, switched today from Lorazepam to Valium 5 to 164mTWICE A DAY AS NEEDED She request referral to psychiatry for her mental health now with worsening anxiety and mood Crying spells, laying around, limited activity due to pain Continue Escitalopram [id number]      08/26/2022    5:15 PM 08/07/2022   10:06 AM 04/20/2022   11:19 AM  Depression screen PHQ 2/9  Decreased Interest 3 3 0  Down, Depressed, Hopeless 3 3 0  PHQ - 2 Score 6 6 0  Altered sleeping 2 2 0  Tired, decreased energy _0 Change in appetite 3 3 0  Feeling bad or failure about  yourself  3 3 0  Trouble concentrating 3 3 0  Moving slowly or fidgety/restless 2 2 0  Suicidal thoughts 1 2 0  PHQ-9 Score _1 Difficult doing work/chores Extremely dIfficult Extremely dIfficult Not difficult at all   [place]-Suicide Severity Rating Scale 1) Have you wished you were dead or wished you could go to sleep and not wake up? - Yes  2) Have you had any actual thoughts of killing yourself? - No  Skip questions 3,4, 5  6) Have you ever done anything, started to do anything, or prepared to do anything to end your life? - No      08/26/2022    5:15 PM 08/07/2022   10:06 AM 04/20/2022   11:19 AM 03/31/2021    1:22 PM  GAD 7 : Generalized Anxiety Score  Nervous, Anxious, on Edge 3 3 0 0  Control/stop worrying 3 3 0 0  Worry too much - different things 3 3 0 0  Trouble relaxing 3 3 0 0  Restless 3 3 0 0  Easily annoyed or irritable 3 3 0 0  Afraid - awful might happen 3 3 0 0  Total GAD 7 Score 21 21 0 0  Anxiety Difficulty Extremely difficult Not difficult at all Not difficult at all Not difficult at all      Social History   Tobacco Use   Smoking status: Former    Packs/day: 0.50    Years: 20.00    Total  pack years: 10.00    Types: Cigarettes    Quit date: 03/21/2018    Years since quitting: 4.4   Smokeless tobacco: Never  Vaping Use   Vaping Use: Some days   Substances: Nicotine, Flavoring  Substance Use Topics   Alcohol use: No    Alcohol/week: 0.0 standard drinks of alcohol   Drug use: No    Review of Systems Per HPI unless specifically indicated above     Objective:    Wt 110 lb (49.9 kg)   LMP  (LMP Unknown)   BMI 17.23 kg/m   Wt Readings from Last 3 Encounters:  08/26/22 110 lb (49.9 kg)  08/12/22 110 lb (49.9 kg)  08/10/22 110 lb (49.9 kg)    Physical Exam  Note examination was completely remotely via video observation objective data only  Gen - well-appearing, no acute distress or apparent pain, comfortable HEENT - eyes  appear clear without discharge or redness Heart/Lungs - cannot examine virtually - observed no evidence of coughing or labored breathing. Abd - cannot examine virtually  Skin - face visible today- no rash Neuro - awake, alert, oriented Psych - anxious appearing   Results for orders placed or performed in visit on 04/02/21  VITAMIN D 25 Hydroxy (Vit-D Deficiency, Fractures)  Result Value Ref Range   Vit D, 25-Hydroxy 33 30 - 100 ng/mL  TSH  Result Value Ref Range   TSH 3.05 mIU/L  Hemoglobin A1c  Result Value Ref Range   Hgb A1c MFr Bld 6.3 (H) <5.7 % of total Hgb   Mean Plasma Glucose 134 mg/dL   eAG (mmol/L) 7.4 mmol/L  CBC with Differential/Platelet  Result Value Ref Range   WBC 5.9 3.8 - 10.8 Thousand/uL   RBC 5.06 3.80 - 5.10 Million/uL   Hemoglobin 14.3 11.7 - 15.5 g/dL   HCT 44.9 35.0 - 45.0 %   MCV 88.7 80.0 - 100.0 fL   MCH 28.3 27.0 - 33.0 pg   MCHC 31.8 (L) 32.0 - 36.0 g/dL   RDW 12.7 11.0 - 15.0 %   Platelets 315 140 - 400 Thousand/uL   MPV 8.6 7.5 - 12.5 fL   Neutro Abs 3,280 1,500 - 7,800 cells/uL   Lymphs Abs 1,782 850 - 3,900 cells/uL   Absolute Monocytes 460 200 - 950 cells/uL   Eosinophils Absolute 325 15 - 500 cells/uL   Basophils Absolute 53 0 - 200 cells/uL   Neutrophils Relative % 55.6 %   Total Lymphocyte 30.2 %   Monocytes Relative 7.8 %   Eosinophils Relative 5.5 %   Basophils Relative 0.9 %  Lipid panel  Result Value Ref Range   Cholesterol 219 (H) <200 mg/dL   HDL 66 > OR = 50 mg/dL   Triglycerides 151 (H) <150 mg/dL   LDL Cholesterol (Calc) 127 (H) mg/dL (calc)   Total CHOL/HDL Ratio 3.3 <5.0 (calc)   Non-HDL Cholesterol (Calc) 153 (H) <130 mg/dL (calc)  COMPLETE METABOLIC PANEL WITH GFR  Result Value Ref Range   Glucose, Bld 104 (H) 65 - 99 mg/dL   BUN 13 7 - 25 mg/dL   Creat 0.83 0.50 - 1.03 mg/dL   eGFR 85 > OR = 60 mL/min/1.29m   BUN/Creatinine Ratio NOT APPLICABLE 6 - 22 (calc)   Sodium 140 135 - 146 mmol/L   Potassium 4.5 3.5  - 5.3 mmol/L   Chloride 106 98 - 110 mmol/L   CO2 26 20 - 32 mmol/L   Calcium 10.1 8.6 - 10.4  mg/dL   Total Protein 7.3 6.1 - 8.1 g/dL   Albumin 4.5 3.6 - 5.1 g/dL   Globulin 2.8 1.9 - 3.7 g/dL (calc)   AG Ratio 1.6 1.0 - 2.5 (calc)   Total Bilirubin 0.6 0.2 - 1.2 mg/dL   Alkaline phosphatase (APISO) 74 37 - 153 U/L   AST 15 10 - 35 U/L   ALT 11 6 - 29 U/L      Assessment & Plan:   Problem List Items Addressed This Visit   None Visit Diagnoses     Generalized anxiety disorder with panic attacks    -  Primary   Relevant Medications   escitalopram (LEXAPRO) 20 MG tablet   Other Relevant Orders   Ambulatory referral to Psychiatry   Moderate episode of recurrent major depressive disorder ([place])       Relevant Medications   escitalopram (LEXAPRO) 20 MG tablet   Other Relevant Orders   Ambulatory referral to Psychiatry      Changed Lorazepam 0.5 to Diazepam 5-7m TWICE A DAY AS NEEDED, better for her anxiety based on results and also improved for muscle spasm rectal  /per Gen Surgery.  Continue Escitalopram 238m Referral to Psychiatry for management of chronic generalized anxiety with panic disorder and major depression recurrent, has tried several medications in past, has had some complicated health situation recently with pain and required hemorrhoid surgery, she has been on diazepam for muscle relaxant and anxiety for panic recently and will need further management and therapy / CBT counseling    Orders Placed This Encounter  Procedures   Ambulatory referral to Psychiatry    Referral Priority:   Routine    Referral Type:   Psychiatric    Referral Reason:   Specialty Services Required    Requested Specialty:   Psychiatry    Number of Visits Requested:   1    No orders of the defined types were placed in this encounter.     Follow up plan: Return if symptoms worsen or fail to improve.   Patient verbalizes understanding with the above medical recommendations  including the limitation of remote medical advice.  Specific follow-up and call-back criteria were given for patient to follow-up or seek medical care more urgently if needed.  Total duration of direct patient care provided via video conference: 10 minutes   AlNobie PutnamDOGradyroup 08/26/2022, 4:33 PM

## 2022-09-01 ENCOUNTER — Encounter: Payer: Self-pay | Admitting: Family Medicine

## 2022-09-01 DIAGNOSIS — K6289 Other specified diseases of anus and rectum: Secondary | ICD-10-CM | POA: Diagnosis not present

## 2022-09-01 DIAGNOSIS — K649 Unspecified hemorrhoids: Secondary | ICD-10-CM | POA: Diagnosis not present

## 2022-09-01 DIAGNOSIS — K602 Anal fissure, unspecified: Secondary | ICD-10-CM | POA: Diagnosis not present

## 2022-09-02 ENCOUNTER — Encounter: Payer: Self-pay | Admitting: Emergency Medicine

## 2022-09-02 ENCOUNTER — Emergency Department
Admission: EM | Admit: 2022-09-02 | Discharge: 2022-09-03 | Disposition: A | Discharge status: Home / Self Care | Payer: 59 | Attending: Emergency Medicine | Admitting: Emergency Medicine

## 2022-09-02 DIAGNOSIS — F410 Panic disorder [episodic paroxysmal anxiety] without agoraphobia: Secondary | ICD-10-CM | POA: Insufficient documentation

## 2022-09-02 DIAGNOSIS — Z87891 Personal history of nicotine dependence: Secondary | ICD-10-CM | POA: Diagnosis not present

## 2022-09-02 DIAGNOSIS — E876 Hypokalemia: Secondary | ICD-10-CM | POA: Insufficient documentation

## 2022-09-02 DIAGNOSIS — F411 Generalized anxiety disorder: Secondary | ICD-10-CM | POA: Insufficient documentation

## 2022-09-02 DIAGNOSIS — F329 Major depressive disorder, single episode, unspecified: Secondary | ICD-10-CM | POA: Diagnosis present

## 2022-09-02 DIAGNOSIS — R69 Illness, unspecified: Secondary | ICD-10-CM | POA: Diagnosis not present

## 2022-09-02 DIAGNOSIS — J449 Chronic obstructive pulmonary disease, unspecified: Secondary | ICD-10-CM | POA: Insufficient documentation

## 2022-09-02 DIAGNOSIS — F32A Depression, unspecified: Secondary | ICD-10-CM | POA: Diagnosis not present

## 2022-09-02 LAB — COMPREHENSIVE METABOLIC PANEL
ALT: 15 U/L (ref 0–44)
AST: 21 U/L (ref 15–41)
Albumin: 4.3 g/dL (ref 3.5–5.0)
Alkaline Phosphatase: 52 U/L (ref 38–126)
Anion gap: 10 (ref 5–15)
BUN: 11 mg/dL (ref 6–20)
CO2: 24 mmol/L (ref 22–32)
Calcium: 9.1 mg/dL (ref 8.9–10.3)
Chloride: 106 mmol/L (ref 98–111)
Creatinine, Ser: 0.76 mg/dL (ref 0.44–1.00)
GFR, Estimated: >60 mL/min (ref >60)
Glucose, Bld: 109 mg/dL — ABNORMAL HIGH (ref 70–99)
Potassium: 3.1 mmol/L — ABNORMAL LOW (ref 3.5–5.1)
Sodium: 140 mmol/L (ref 135–145)
Total Bilirubin: 1 mg/dL (ref 0.3–1.2)
Total Protein: 6.7 g/dL (ref 6.5–8.1)

## 2022-09-02 LAB — CBC
HCT: 43.6 % (ref 36.0–46.0)
Hemoglobin: 14.3 g/dL (ref 12.0–15.0)
MCH: 28.8 pg (ref 26.0–34.0)
MCHC: 32.8 g/dL (ref 30.0–36.0)
MCV: 87.7 fL (ref 80.0–100.0)
Platelets: 355 10*3/uL (ref 150–400)
RBC: 4.97 MIL/uL (ref 3.87–5.11)
RDW: 13.5 % (ref 11.5–15.5)
WBC: 6.5 10*3/uL (ref 4.0–10.5)
nRBC: 0 % (ref 0.0–0.2)

## 2022-09-02 LAB — ACETAMINOPHEN LEVEL: Acetaminophen (Tylenol), Serum: <10 ug/mL — ABNORMAL LOW (ref 10–30)

## 2022-09-02 LAB — ETHANOL: Alcohol, Ethyl (B): <10 mg/dL (ref <10)

## 2022-09-02 LAB — SALICYLATE LEVEL: Salicylate Lvl: <7 mg/dL — ABNORMAL LOW (ref 7.0–30.0)

## 2022-09-02 NOTE — ED Triage Notes (Signed)
Pt presents via POV with complaints of an anal fissure causing her severe anxiety and depression. Pt states that her anxiety has gotten so bad that she has had some suicidal thoughts. She denies having a plan but she wants "be done" and "end it all". Pt tearful in triage.

## 2022-09-02 NOTE — ED Notes (Signed)
Pt was provided with blanket, remote, and water. Door closed, light remained on. Pt denies further needs

## 2022-09-02 NOTE — ED Provider Notes (Signed)
[facility] Provider Note    Event Date/Time   First MD Initiated Contact with Patient 09/02/22 2303     (approximate)   History   Psychiatric Evaluation   HPI  KYLIE SIMMONDS is a 51 y.o. female who presents to the ED for evaluation of Psychiatric Evaluation   I reviewed operative note from 11/22.  Anoscopy done for chronic anal fissure, chemodenervation of internal anal sphincter.  Patient presents to the ED tonight for evaluation of depression over her chronic discomfort and associated decreased mobility from her anal fissures.  Reports that she has difficulty doing anything but lying on her side.  Reports yesterday thinking that if she was not here then she would not have this discomfort.  She denies ever wanting to hurt herself and does not think that she would ever hurt herself, noting her children and grandchildren.  No current plans to harm herself and her husband want her to get checked out tonight so he drove her here to make sure she did not get "more depressed."  Physical Exam   Triage Vital Signs: ED Triage Vitals  Enc Vitals Group     BP 09/02/22 2243 (!) 132/90     Pulse Rate 09/02/22 2243 91     Resp 09/02/22 2243 18     Temp 09/02/22 2243 98.1 F (36.7 C)     Temp Source 09/02/22 2243 Oral     SpO2 09/02/22 2243 98 %     Weight 09/02/22 2246 110 lb (49.9 kg)     Height 09/02/22 2246 '5\' 7"'$  (1.702 m)     Head Circumference --      Peak Flow --      Pain Score 09/02/22 2246 9     Pain Loc --      Pain Edu? --      Excl. in [place]? --     Most recent vital signs: Vitals:   09/02/22 2243  BP: (!) 132/90  Pulse: 91  Resp: 18  Temp: 98.1 F (36.7 C)  SpO2: 98%    General: Awake, no distress.  Linear thoughts.  Looks well CV:  Good peripheral perfusion.  Resp:  Normal effort.  Abd:  No distention.  Soft and benign MSK:  No deformity noted.  Neuro:  No focal deficits appreciated. Other:     ED Results / Procedures /  Treatments   Labs (all labs ordered are listed, but only abnormal results are displayed) Labs Reviewed  COMPREHENSIVE METABOLIC PANEL - Abnormal; Notable for the following components:      Result Value   Potassium 3.1 (*)    Glucose, Bld 109 (*)    All other components within normal limits  SALICYLATE LEVEL - Abnormal; Notable for the following components:   Salicylate Lvl <5.0 (*)    All other components within normal limits  ACETAMINOPHEN LEVEL - Abnormal; Notable for the following components:   Acetaminophen (Tylenol), Serum <10 (*)    All other components within normal limits  ETHANOL  CBC  URINE DRUG SCREEN, QUALITATIVE ([facility] ONLY)    EKG   RADIOLOGY   Official radiology report(s): No results found.  PROCEDURES and INTERVENTIONS:  Procedures  Medications - No data to display   IMPRESSION / MDM / [place] / ED COURSE  I reviewed the triage vital signs and the nursing notes.  Differential diagnosis includes, but is not limited to, acute depressive episode, malingering, suicidal, polysubstance abuse  51 year old woman presents  with anhedonia and acute depression associated with chronic discomfort.  No complaints of suicidal intent or plans.  I see no indications for IVC at this patient.  No signs or particular toxidromes or ingestions.  Noted to have a mild hypokalemia that we will repleted orally.  Otherwise normal CBC and tox screen.  Negative ethanol.  Will consult psychiatry.  I anticipate she will be suitable for outpatient management.  Clinical Course as of 09/02/22 2357  Wed Sep 02, 2022  2354 The patient has been placed in psychiatric observation due to the need to provide a safe environment for the patient while obtaining psychiatric consultation and evaluation, as well as ongoing medical and medication management to treat the patient's condition.  The patient has not been placed under full IVC at this time.   [DS]    Clinical Course User  Index [DS] Vladimir Crofts, MD     FINAL CLINICAL IMPRESSION(S) / ED DIAGNOSES   Final diagnoses:  None     Rx / DC Orders   ED Discharge Orders     None        Note:  This document was prepared using Dragon voice recognition software and may include unintentional dictation errors.   Vladimir Crofts, MD 09/02/22 [phone]

## 2022-09-02 NOTE — ED Notes (Signed)
Pt dressed out by this RN & EDTLattie Haw), belongings include:  1 white jacket 1 pair of black boots 1 pair of pink pants 1 pink sweater  1 black/gray bra 1 pair of black underwear 1 gray bracelet 3 gray rings 1 yellow ring 1 black hair tie 1 black cell phone 1 orange cup 1 pair of pink/orange socks

## 2022-09-02 NOTE — ED Notes (Signed)
Pt reports an anal fissure that causes constant pain. Reports history of depression and anxiety, takes medication for 18 years for depression and valium which is new for anxiety. Pt reports she had suicidal thought of the pain being over if she were dead yesterday. Denies plan or intent. Reports reasons to live - family, son, husband, grandkids. States she just is searching for more help with her anxiety while she deals with her medical problems.

## 2022-09-03 DIAGNOSIS — R69 Illness, unspecified: Secondary | ICD-10-CM | POA: Diagnosis not present

## 2022-09-03 DIAGNOSIS — F32A Depression, unspecified: Secondary | ICD-10-CM

## 2022-09-03 MED ORDER — ACETAMINOPHEN 500 MG PO TABS
1000.0000 mg | ORAL_TABLET | Freq: Once | ORAL | Status: AC
  Administered 2022-09-03: 1000 mg via ORAL
  Filled 2022-09-03: qty 2

## 2022-09-03 MED ORDER — OXYCODONE HCL 5 MG PO TABS
5.0000 mg | ORAL_TABLET | Freq: Once | ORAL | Status: AC
  Administered 2022-09-03: 5 mg via ORAL
  Filled 2022-09-03: qty 1

## 2022-09-03 MED ORDER — POTASSIUM CHLORIDE CRYS ER 20 MEQ PO TBCR
40.0000 meq | EXTENDED_RELEASE_TABLET | Freq: Once | ORAL | Status: AC
  Administered 2022-09-03: 40 meq via ORAL
  Filled 2022-09-03: qty 2

## 2022-09-03 NOTE — Consult Note (Signed)
[facility] Psychiatry Consult   Reason for Consult:Psychiatric Evaluation  Referring Physician: Dr. Tamala Julian Patient Identification: Lori Davies MRN:  [id number] Principal Diagnosis: <principal problem not specified> Diagnosis:  Active Problems:   GAD (generalized anxiety disorder)   Panic attacks   Total Time spent with patient: 1 hour  Subjective: "I have an anal fissure and it is not getting any better."  Lori Davies is a 51 y.o. female patient presented to [facility] ED via POV voluntarily due to chronic health problems. The patient has an anal fissure that is not healing which kept her depressed and anxious. The patient states she has had long bouts of anxiety and panic attacks since her mother's death. She is currently on Zoloft 25 mg daily. The patient said that she has an autistic child, and today, her 66 year old daughter told her she was relocating to [place] in two weeks. The patient shared she has been sad and has no desire to do anything. "All I do is just lay on my side. I am in a lot of pain most of the time."  This provider saw The patient face-to-face; the chart was reviewed, and Dr. Tamala Julian was consulted on 09/03/2022 due to the patient's care. It was discussed with the EDP that the patient does not meet the criteria to be admitted to the psychiatric inpatient unit. The patient was given a referral to [facility] for outpatient therapy.  Upon evaluation, the patient is alert and oriented x 4, calm, cooperative, and mood-congruent with affect. The patient does not appear to be responding to internal or external stimuli. Neither is the patient presenting with any delusional thinking. The patient denies auditory or visual hallucinations. The patient denies any suicidal, homicidal, or self-harm ideations. The patient is not presenting with any psychotic or paranoid behaviors. During an encounter with the patient, she could answer questions appropriately.  HPI: Per Dr. Tamala Julian, Lori Davies  is a 51 y.o. female who presents to the ED for evaluation of Psychiatric Evaluation   I reviewed operative note from 11/22.  Anoscopy done for chronic anal fissure, chemodenervation of internal anal sphincter.   Patient presents to the ED tonight for evaluation of depression over her chronic discomfort and associated decreased mobility from her anal fissures.  Reports that she has difficulty doing anything but lying on her side.  Reports yesterday thinking that if she was not here then she would not have this discomfort.  She denies ever wanting to hurt herself and does not think that she would ever hurt herself, noting her children and grandchildren.  No current plans to harm herself and her husband want her to get checked out tonight so he drove her here to make sure she did not get "more depressed."  Past Psychiatric History:  Anxiety  Risk to Self:   Risk to Others:   Prior Inpatient Therapy:   Prior Outpatient Therapy:    Past Medical History:  Past Medical History:  Diagnosis Date   Anxiety    Chronic anal fissure    COPD, mild ([place]) 2016   Ectopic pregnancy 2016   History of kidney stones    Internal and external thrombosed hemorrhoids    Pre-diabetes     Past Surgical History:  Procedure Laterality Date   ADENOIDECTOMY     BOTOX INJECTION N/A 08/12/2022   Procedure: BOTOX INJECTION;  Surgeon: Herbert Pun, MD;  Location: [facility] ORS;  Service: General;  Laterality: N/A;   Family History:  Family History  Problem  Relation Age of Onset   Heart disease Mother    Lung disease Mother    Heart disease Father    Hypertension Father    Breast cancer Maternal Aunt    Diabetes Maternal Aunt    Autism Son    Colon cancer Neg Hx    Family Psychiatric  History: History reviewed. No pertinent family psychiatric history Social History:  Social History   Substance and Sexual Activity  Alcohol Use No   Alcohol/week: 0.0 standard drinks of alcohol     Social History    Substance and Sexual Activity  Drug Use No    Social History   Socioeconomic History   Marital status: Married    Spouse name: Annie Main   Number of children: 3   Years of education: Not on file   Highest education level: Not on file  Occupational History   Not on file  Tobacco Use   Smoking status: Former    Packs/day: 0.50    Years: 20.00    Total pack years: 10.00    Types: Cigarettes    Quit date: 03/21/2018    Years since quitting: 4.4   Smokeless tobacco: Never  Vaping Use   Vaping Use: Some days   Substances: Nicotine, Flavoring  Substance and Sexual Activity   Alcohol use: No    Alcohol/week: 0.0 standard drinks of alcohol   Drug use: No   Sexual activity: Yes  Other Topics Concern   Not on file  Social History Narrative   Not on file   Social Determinants of Health   Financial Resource Strain: Not on file  Food Insecurity: Not on file  Transportation Needs: Not on file  Physical Activity: Not on file  Stress: Not on file  Social Connections: Not on file   Additional Social History:    Allergies:   Allergies  Allergen Reactions   Clavulanic Acid Nausea And Vomiting   Fish Oil Hives   Sulfa Antibiotics Nausea Only    Labs:  Results for orders placed or performed during the hospital encounter of 09/02/22 (from the past 48 hour(s))  Comprehensive metabolic panel     Status: Abnormal   Collection Time: 09/02/22 10:47 PM  Result Value Ref Range   Sodium 140 135 - 145 mmol/L   Potassium 3.1 (L) 3.5 - 5.1 mmol/L   Chloride 106 98 - 111 mmol/L   CO2 24 22 - 32 mmol/L   Glucose, Bld 109 (H) 70 - 99 mg/dL    Comment: Glucose reference range applies only to samples taken after fasting for at least 8 hours.   BUN 11 6 - 20 mg/dL   Creatinine, Ser 0.76 0.44 - 1.00 mg/dL   Calcium 9.1 8.9 - 10.3 mg/dL   Total Protein 6.7 6.5 - 8.1 g/dL   Albumin 4.3 3.5 - 5.0 g/dL   AST 21 15 - 41 U/L   ALT 15 0 - 44 U/L   Alkaline Phosphatase 52 38 - 126 U/L    Total Bilirubin 1.0 0.3 - 1.2 mg/dL   GFR, Estimated >60 >60 mL/min    Comment: (NOTE) Calculated using the CKD-EPI Creatinine Equation (2021)    Anion gap 10 5 - 15    Comment: Performed at [facility], [address] [place], [place] [address]  Ethanol     Status: None   Collection Time: 09/02/22 10:47 PM  Result Value Ref Range   Alcohol, Ethyl (B) <10 <10 mg/dL    Comment: (NOTE)  Lowest detectable limit for serum alcohol is 10 mg/dL.  For medical purposes only. Performed at [facility], [address] [place], [place] [address]   Salicylate level     Status: Abnormal   Collection Time: 09/02/22 10:47 PM  Result Value Ref Range   Salicylate Lvl <1.6 (L) 7.0 - 30.0 mg/dL    Comment: Performed at [facility], [address] [place], [place] [address]  Acetaminophen level     Status: Abnormal   Collection Time: 09/02/22 10:47 PM  Result Value Ref Range   Acetaminophen (Tylenol), Serum <10 (L) 10 - 30 ug/mL    Comment: (NOTE) Therapeutic concentrations vary significantly. A range of 10-30 ug/mL  may be an effective concentration for many patients. However, some  are best treated at concentrations outside of this range. Acetaminophen concentrations >150 ug/mL at 4 hours after ingestion  and >50 ug/mL at 12 hours after ingestion are often associated with  toxic reactions.  Performed at [facility], [address] [place], [place] [address]   cbc     Status: None   Collection Time: 09/02/22 10:47 PM  Result Value Ref Range   WBC 6.5 4.0 - 10.5 K/uL   RBC 4.97 3.87 - 5.11 MIL/uL   Hemoglobin 14.3 12.0 - 15.0 g/dL   HCT 43.6 36.0 - 46.0 %   MCV 87.7 80.0 - 100.0 fL   MCH 28.8 26.0 - 34.0 pg   MCHC 32.8 30.0 - 36.0 g/dL   RDW 13.5 11.5 - 15.5 %   Platelets 355 150 - 400 K/uL   nRBC 0.0 0.0 - 0.2 %    Comment: Performed at [facility], [address] [place], [place] [address]    No current facility-administered  medications for this encounter.   Current Outpatient Medications  Medication Sig Dispense Refill   diazepam (VALIUM) 5 MG tablet Take 1-2 tablets (5-10 mg total) by mouth every 12 (twelve) hours as needed for anxiety (muscle spasm). 60 tablet 0   escitalopram (LEXAPRO) 20 MG tablet Take 20 mg by mouth daily.     montelukast (SINGULAIR) 10 MG tablet Take 1 tablet (10 mg total) by mouth at bedtime. 90 tablet 3   nitroGLYCERIN (NITROGLYN) 2 % OINT ointment Apply 1 Application topically as needed (with 0.2% lidocaine to rectum).     UNABLE TO FIND Place 1 application  rectally 4 (four) times daily as needed. Med Name: diltiazem cream      Musculoskeletal: Strength & Muscle Tone: within normal limits Gait & Station: normal Patient leans: Right  Psychiatric Specialty Exam:  Presentation  General Appearance:  Appropriate for Environment  Eye Contact: Good  Speech: Clear and Coherent  Speech Volume: Normal  Handedness: Right   Mood and Affect  Mood: Depressed  Affect: Depressed; Flat   Thought Process  Thought Processes: Coherent  Descriptions of Associations:Intact  Orientation:Full (Time, Place and Person)  Thought Content:Logical  History of Schizophrenia/Schizoaffective disorder:No data recorded Duration of Psychotic Symptoms:No data recorded Hallucinations:Hallucinations: None  Ideas of Reference:None  Suicidal Thoughts:Suicidal Thoughts: Yes, Passive SI Passive Intent and/or Plan: Without Intent; Without Plan  Homicidal Thoughts:Homicidal Thoughts: No   Sensorium  Memory: Immediate Good; Recent Good; Remote Good  Judgment: Good  Insight: Good   Executive Functions  Concentration: Good  Attention Span: Good  Recall: Good  Fund of Knowledge: Good  Language: Good   Psychomotor Activity  Psychomotor Activity: Psychomotor Activity: Normal   Assets  Assets: Communication Skills; Desire for  Improvement; Physical Health;  Resilience; Social Support   Sleep  Sleep: Sleep: Fair Number of Hours of Sleep: 6   Physical Exam: Physical Exam Vitals and nursing note reviewed.  Constitutional:      Appearance: Normal appearance. She is ill-appearing.  HENT:     Head: Normocephalic and atraumatic.     Right Ear: External ear normal.     Left Ear: External ear normal.     Nose: Nose normal.  Cardiovascular:     Rate and Rhythm: Normal rate.     Pulses: Normal pulses.  Pulmonary:     Effort: Pulmonary effort is normal.  Musculoskeletal:        General: Normal range of motion.     Cervical back: Normal range of motion and neck supple.  Neurological:     General: No focal deficit present.     Mental Status: She is alert and oriented to person, place, and time.  Psychiatric:        Attention and Perception: Attention and perception normal.        Mood and Affect: Mood is anxious and depressed. Affect is blunt.        Speech: Speech normal.        Behavior: Behavior normal. Behavior is cooperative.        Thought Content: Thought content includes suicidal ideation.        Cognition and Memory: Cognition and memory normal.        Judgment: Judgment normal.    Review of Systems  Psychiatric/Behavioral:  Positive for depression and suicidal ideas. The patient is nervous/anxious.   All other systems reviewed and are negative.  Blood pressure (!) 95/56, pulse 65, temperature 97.8 F (36.6 C), temperature source Oral, resp. rate 18, height '5\' 7"'$  (1.702 m), weight 49.9 kg, SpO2 95 %. Body mass index is 17.23 kg/m.  Treatment Plan Summary: Plan   Patient does not meet the criteria for psychiatric inpatient admission  Disposition: No evidence of imminent risk to self or others at present.   Patient does not meet criteria for psychiatric inpatient admission. Supportive therapy provided about ongoing stressors. Refer to IOP. Discussed crisis plan, support from social network, calling 911, coming to the  Emergency Department, and calling Suicide Hotline.  Caroline Sauger, NP 09/03/2022 1:54 AM
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
## 2022-09-08 ENCOUNTER — Emergency Department: Payer: 59

## 2022-09-08 ENCOUNTER — Other Ambulatory Visit: Payer: Self-pay

## 2022-09-08 ENCOUNTER — Encounter: Payer: Self-pay | Admitting: Emergency Medicine

## 2022-09-08 ENCOUNTER — Emergency Department
Admission: EM | Admit: 2022-09-08 | Discharge: 2022-09-08 | Disposition: A | Discharge status: Home / Self Care | Payer: 59 | Attending: Emergency Medicine | Admitting: Emergency Medicine

## 2022-09-08 DIAGNOSIS — K640 First degree hemorrhoids: Secondary | ICD-10-CM | POA: Insufficient documentation

## 2022-09-08 DIAGNOSIS — K600 Acute anal fissure: Secondary | ICD-10-CM | POA: Diagnosis not present

## 2022-09-08 DIAGNOSIS — N9489 Other specified conditions associated with female genital organs and menstrual cycle: Secondary | ICD-10-CM | POA: Diagnosis not present

## 2022-09-08 DIAGNOSIS — K6289 Other specified diseases of anus and rectum: Secondary | ICD-10-CM

## 2022-09-08 DIAGNOSIS — R102 Pelvic and perineal pain: Secondary | ICD-10-CM | POA: Diagnosis not present

## 2022-09-08 DIAGNOSIS — N200 Calculus of kidney: Secondary | ICD-10-CM | POA: Diagnosis not present

## 2022-09-08 DIAGNOSIS — K602 Anal fissure, unspecified: Secondary | ICD-10-CM | POA: Diagnosis not present

## 2022-09-08 LAB — COMPREHENSIVE METABOLIC PANEL
ALT: 22 U/L (ref 0–44)
AST: 25 U/L (ref 15–41)
Albumin: 5 g/dL (ref 3.5–5.0)
Alkaline Phosphatase: 62 U/L (ref 38–126)
Anion gap: 13 (ref 5–15)
BUN: 9 mg/dL (ref 6–20)
CO2: 24 mmol/L (ref 22–32)
Calcium: 10.3 mg/dL (ref 8.9–10.3)
Chloride: 103 mmol/L (ref 98–111)
Creatinine, Ser: 0.61 mg/dL (ref 0.44–1.00)
GFR, Estimated: >60 mL/min (ref >60)
Glucose, Bld: 94 mg/dL (ref 70–99)
Potassium: 3.5 mmol/L (ref 3.5–5.1)
Sodium: 140 mmol/L (ref 135–145)
Total Bilirubin: 1 mg/dL (ref 0.3–1.2)
Total Protein: 8.3 g/dL — ABNORMAL HIGH (ref 6.5–8.1)

## 2022-09-08 LAB — LIPASE, BLOOD: Lipase: 36 U/L (ref 11–51)

## 2022-09-08 LAB — URINALYSIS, ROUTINE W REFLEX MICROSCOPIC
Bilirubin Urine: NEGATIVE
Glucose, UA: NEGATIVE mg/dL
Hgb urine dipstick: NEGATIVE
Ketones, ur: 5 mg/dL — AB
Leukocytes,Ua: NEGATIVE
Nitrite: NEGATIVE
Protein, ur: NEGATIVE mg/dL
Specific Gravity, Urine: 1.002 — ABNORMAL LOW (ref 1.005–1.030)
pH: 8 (ref 5.0–8.0)

## 2022-09-08 LAB — CBC WITH DIFFERENTIAL/PLATELET
Abs Immature Granulocytes: 0.01 10*3/uL (ref 0.00–0.07)
Basophils Absolute: 0.1 10*3/uL (ref 0.0–0.1)
Basophils Relative: 1 %
Eosinophils Absolute: 0.3 10*3/uL (ref 0.0–0.5)
Eosinophils Relative: 5 %
HCT: 48.2 % — ABNORMAL HIGH (ref 36.0–46.0)
Hemoglobin: 15.8 g/dL — ABNORMAL HIGH (ref 12.0–15.0)
Immature Granulocytes: 0 %
Lymphocytes Relative: 27 %
Lymphs Abs: 1.8 10*3/uL (ref 0.7–4.0)
MCH: 28.5 pg (ref 26.0–34.0)
MCHC: 32.8 g/dL (ref 30.0–36.0)
MCV: 87 fL (ref 80.0–100.0)
Monocytes Absolute: 0.5 10*3/uL (ref 0.1–1.0)
Monocytes Relative: 8 %
Neutro Abs: 3.9 10*3/uL (ref 1.7–7.7)
Neutrophils Relative %: 59 %
Platelets: 353 10*3/uL (ref 150–400)
RBC: 5.54 MIL/uL — ABNORMAL HIGH (ref 3.87–5.11)
RDW: 13.5 % (ref 11.5–15.5)
WBC: 6.6 10*3/uL (ref 4.0–10.5)
nRBC: 0 % (ref 0.0–0.2)

## 2022-09-08 MED ORDER — IOHEXOL 300 MG/ML  SOLN
100.0000 mL | Freq: Once | INTRAMUSCULAR | Status: AC | PRN
  Administered 2022-09-08: 100 mL via INTRAVENOUS

## 2022-09-08 NOTE — ED Triage Notes (Addendum)
Pt pov for rectal/ abd pain that started 2 months ago. Pt had been seen multiple times for same, last was 12/13 here. Pt states that the pain has increased to 9/10 and she just needs relief. Pt denies S/I at this time. Pt states that she has lost 40+/- lbs since last month.

## 2022-09-08 NOTE — ED Provider Notes (Signed)
[facility] Provider Note  Patient Contact: 7:12 PM (approximate)   History   Abdominal Pain   HPI  Lori Davies is a 51 y.o. female who presents emergency department complaining of pelvic/rectal/anal pain.  Patient has been dealing with a tight rectal sphincter, has seen multiple specialist including surgery for endoscopy.  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  Patient has presented for further evaluation of ongoing pelvic pain.  She has been largely bedbound due to the pain.  States that if she sits her rectum hurts, if she is up and moving it is more deep pelvic pain.  No vaginal bleeding or discharge.  No fevers, chills, URI symptoms.  She is on stool softeners, 2 topical medications for fissure and hemorrhoids.     Physical Exam   Triage Vital Signs: ED Triage Vitals  Enc Vitals Group     BP 09/08/22 1630 91/70     Pulse Rate 09/08/22 1630 91     Resp 09/08/22 1630 16     Temp 09/08/22 1630 98.2 F (36.8 C)     Temp Source 09/08/22 1630 Oral     SpO2 09/08/22 1630 98 %     Weight 09/08/22 1632 110 lb (49.9 kg)     Height 09/08/22 1632 '5\' 7"'$  (1.702 m)     Head Circumference --      Peak Flow --      Pain Score 09/08/22 1632 9     Pain Loc --      Pain Edu? --      Excl. in [place]? --     Most recent vital signs: Vitals:   09/08/22 1630  BP: 91/70  Pulse: 91  Resp: 16  Temp: 98.2 F (36.8 C)  SpO2: 98%     General: Alert and in no acute distress.  Cardiovascular:  Good peripheral perfusion Respiratory: Normal respiratory effort without tachypnea or retractions. Lungs CTAB. Good air entry to the bases with no decreased or absent breath sounds. Gastrointestinal: Bowel sounds 4 quadrants.  Soft to palpation.  Patient has what appears to be deep tenderness over the suprapubic region.  No guarding or rigidity. No palpable masses. No distention. No CVA tenderness. Musculoskeletal: Full  range of motion to all extremities.  Neurologic:  No gross focal neurologic deficits are appreciated.  Skin:   No rash noted Other:   ED Results / Procedures / Treatments   Labs (all labs ordered are listed, but only abnormal results are displayed) Labs Reviewed  URINALYSIS, ROUTINE W REFLEX MICROSCOPIC - Abnormal; Notable for the following components:      Result Value   Color, Urine STRAW (*)    APPearance CLEAR (*)    Specific Gravity, Urine 1.002 (*)    Ketones, ur 5 (*)    All other components within normal limits  CBC WITH DIFFERENTIAL/PLATELET - Abnormal; Notable for the following components:   RBC 5.54 (*)    Hemoglobin 15.8 (*)    HCT 48.2 (*)    All other components within normal limits  COMPREHENSIVE METABOLIC PANEL - Abnormal; Notable for the following components:   Total Protein 8.3 (*)    All other components within normal limits  LIPASE, BLOOD     EKG     RADIOLOGY  I personally viewed, evaluated, and interpreted these images as part of my medical decision making, as well as reviewing the written report by the radiologist.  ED Provider Interpretation: No evidence of perirectal or perianal abscess.  Patient has some vascular congestion around the anal canal consistent with hemorrhoids.  Patient has filling with prominent gonadal and pelvic collateral vessels consistent with pelvic venous insufficiency.  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  Benign hemangioma in the liver  CT ABDOMEN PELVIS W CONTRAST  Result Date: 09/08/2022 CLINICAL DATA:  Left lower quadrant abdominal pain with weight loss x2 months. Severe rectal pressure. Decreased appetite. EXAM: CT ABDOMEN AND PELVIS WITH CONTRAST TECHNIQUE: Multidetector CT imaging of the abdomen and pelvis was performed using the standard protocol following bolus administration of intravenous contrast. RADIATION DOSE REDUCTION: This exam was performed according to the departmental dose-optimization  program which includes automated exposure control, adjustment of the mA and/or kV according to patient size and/or use of iterative reconstruction technique. CONTRAST:  146m OMNIPAQUE IOHEXOL 300 MG/ML  SOLN COMPARISON:  CT chest August 27, 2015. FINDINGS: Lower chest: Linear bands of atelectasis versus scarring in the bilateral lung bases. Hepatobiliary: Hypodense 2.9 cm lesion in the right lobe of the liver on image 9/2 which demonstrates a peripheral nodular focus of enhancement with some filling in of the lesion with contrast on delayed imaging, present dating back to at least September 06, 2015 chest CT when it measured 2.7 cm compatible with a benign hepatic hemangioma. Additional lesion in the anterior right lobe of the liver measuring 7 mm on image 8/2 was also subtly evident on in retrospect dating back to CT August 27, 2015 likely reflecting a benign hepatic hemangioma. Gallbladder is unremarkable. No biliary ductal dilation. Pancreas: No pancreatic ductal dilation or evidence of acute inflammation. Spleen: No splenomegaly or focal splenic lesion. Adrenals/Urinary Tract: Bilateral adrenal glands appear normal. No hydronephrosis. Nonobstructive 8 mm left lower pole renal stone. Kidneys demonstrate symmetric enhancement and excretion of contrast material. Urinary bladder is nondistended limiting evaluation. Stomach/Bowel: No radiopaque enteric contrast material was administered. Stomach is minimally distended limiting evaluation. No pathologic dilation of small or large bowel. Normal appendix. There are redundant loops of sigmoid and transverse colon. Colon is predominately decompressed limiting evaluation. Possible wall thickening through the anal canal with hyperdense serpiginous bands extending through the anal canal for instance on image 15/5 possibly reflecting hemorrhoidal vessels. Vascular/Lymphatic: Normal caliber abdominal aorta. Early filling of a prominent left gonadal vein and left pelvic  collateral vessels. Reproductive: Lobular uterine contour commonly reflects leiomyomas. Other: Trace pelvic free fluid is within physiologic normal limits. Musculoskeletal: No aggressive lytic or blastic lesion of bone. No acute osseous abnormality. IMPRESSION: 1. Possible wall thickening through the anal canal with hyperdense serpiginous bands extending through the anal canal, possibly reflecting hemorrhoidal vessels. Suggest further evaluation with direct visualization. 2. Early filling of a prominent left gonadal vein and left pelvic collateral vessels, which can be seen in the setting of pelvic venous insufficiency. 3. Nonobstructive 8 mm left lower pole renal stone. 4. Lobular uterine contour commonly reflects leiomyomas. Consider further evaluation with nonemergent pelvic ultrasound. 5. Stable hypodense 2.9 cm lesion in the right lobe of the liver which demonstrates a peripheral nodular focus of enhancement with some filling in of the lesion with contrast on delayed imaging, present dating back to at least September 06, 2015 chest CT, compatible with a benign hepatic hemangioma. Additional 7 mm lesion in the anterior right lobe of the liver was also subtly evident on in retrospect dating back to CT August 27, 2015 likely reflecting a benign hepatic hemangioma. Electronically Signed  By: Dahlia Bailiff M.D.   On: 09/08/2022 18:07    PROCEDURES:  Critical Care performed: No  Procedures   MEDICATIONS ORDERED IN ED: Medications  iohexol (OMNIPAQUE) 300 MG/ML solution 100 mL (100 mLs Intravenous Contrast Given 09/08/22 1740)     IMPRESSION / MDM / ASSESSMENT AND PLAN / ED COURSE  I reviewed the triage vital signs and the nursing notes.                              Differential diagnosis includes, but is not limited to, pelvic pain, rectal pain, rectal abscess, pelvic abscess, anal fissure, hemorrhoids, ovarian cancer, rectal cancer  Patient's presentation is most consistent with acute  presentation with potential threat to life or bodily function.   Patient's diagnosis is consistent with pelvic pain, pelvic venous signs of urgency, hemorrhoid, rectal fissure.  Patient has had direct visualization of the rectum from general surgery.  She has what appears to be a chronic healing fissure as well as hemorrhoids.  Patient is having this deep pelvic pain that is worsened with ambulation and movement, however sitting create increases her pain from her fissure and hemorrhoids.  Patient has been largely sedentary for the last 6 weeks, losing weight at this time.  No fevers or chills.  Labs are overall reassuring.  Patient had imaging which reveals some perirectal vascular congestion concerning for hemorrhoid.  No evidence of perirectal abscess or perianal abscess.  Patient has findings in the gonadal and the pelvic collateral vessels consistent with pelvic venous insufficiency.  Given the symptoms of worsening pelvic pain, I will refer her to vascular surgery for further evaluation of possible pelvic venous insufficiency.  Continue prescribed medications for fissure and hemorrhoids.  Return precautions discussed with the patient.  Patient is given ED precautions to return to the ED for any worsening or new symptoms.        FINAL CLINICAL IMPRESSION(S) / ED DIAGNOSES   Final diagnoses:  Pelvic pain  Rectal pain  Rectal fissure  Grade I hemorrhoids  Pelvic congestion syndrome     Rx / DC Orders   ED Discharge Orders     None        Note:  This document was prepared using Dragon voice recognition software and may include unintentional dictation errors.   Brynda Peon 09/08/22 Karsten Fells, MD 09/08/22 [phone]

## 2022-09-08 NOTE — ED Provider Triage Note (Signed)
Emergency Medicine Provider Triage Evaluation Note  Lori Davies , a 51 y.o. female  was evaluated in triage.  Pt complains of abdominal pain x2 months. Patient reports 40 lb weight loss since that time. Never had colonscopy. Feels nauseous, no vomiting. No fever. Normal BM  Review of Systems  Positive: Abd pain, weight loss Negative: Vomiting, fever  Physical Exam  LMP  (LMP Unknown)  Gen:   Awake, no distress   Resp:  Normal effort  MSK:   Moves extremities without difficulty  Other:    Medical Decision Making  Medically screening exam initiated at 4:29 PM.  Appropriate orders placed.  Lori Davies was informed that the remainder of the evaluation will be completed by another provider, this initial triage assessment does not replace that evaluation, and the importance of remaining in the ED until their evaluation is complete.     Marquette Old, PA-C 09/08/22 [phone]

## 2022-09-10 ENCOUNTER — Telehealth: Payer: Self-pay | Admitting: Family Medicine

## 2022-09-10 ENCOUNTER — Ambulatory Visit: Payer: 59 | Admitting: Physical Therapy

## 2022-09-10 NOTE — Telephone Encounter (Signed)
Could you please call patient to follow-up status of her follow-up with Colorectal Surgeons?  I talked to Dr Windell Moment tonight 12/21 and the latest update is that the patient's symptoms ultimately have still persisted with severe rectal pain impacting her function.  She was advised by Dr Windell Moment and his office to pursue Colorectal Surgery specialist next, since the Botox injection did not resolve her problem.  She has seen Dr Johney Maine at [facility] in [place], but it sounds like she prefers to not follow up with them.  She is scheduled with a Dr Berdine Addison at [facility] in [place] on 10/15/22.  She also has reported that she has seen a DIFFERENT Colorectal specialist in [place] and they recommended an MRI.  If you can help clarify which Colorectal specialist she saw in [place], and what her follow-up plan is that would be helpful going forward.  She should see if they can order the MRI and testing that they need if they can address her problem. If not then she may need to return to Dr Johney Maine in [place] or keep upcoming apt with Dr Berdine Addison in [place] in January.  I am happy to follow up with her more on her mood and anxiety in regards to this situation if that is worsening as well.  [name], DO [facility] 09/10/2022, 6:05 PM

## 2022-09-11 ENCOUNTER — Ambulatory Visit: Payer: Self-pay | Admitting: *Deleted

## 2022-09-11 NOTE — Telephone Encounter (Signed)
Summary: poss mri   Pt called 11:58 am wanting to get K to order her an MRI per Dr Peyton Najjar office.  However, it looks like they advised her to see Dr Jennette Banker that he could order MRI. Pt would like a call back to go over the information.  B/c she said Dr Peyton Najjar told her she could go on over to the hospital and they could get in touch w/ her dr and do the MRI         Call to patient- she is aware the office is closed for holiday and will not be open until Tuesday. Patient states she has contacted her insurance and they  will cover if ordered at ED and PA is received so she is going to go ahead and go that route. Patient advised her providers have spoken and are working to get her referral in place as she requested. Reason for Disposition  General information question, no triage required and triager able to answer question  Answer Assessment - Initial Assessment Questions 1. REASON FOR CALL or QUESTION: "What is your reason for calling today?" or "How can I best help you?" or "What question do you have that I can help answer?"     Patient has called office hoping to get order for MRI. See note  Protocols used: Information Only Call - No Triage-A-AH

## 2022-09-12 ENCOUNTER — Other Ambulatory Visit: Payer: Self-pay

## 2022-09-12 ENCOUNTER — Encounter: Payer: Self-pay | Admitting: Emergency Medicine

## 2022-09-12 ENCOUNTER — Emergency Department: Payer: 59

## 2022-09-12 ENCOUNTER — Emergency Department
Admission: EM | Admit: 2022-09-12 | Discharge: 2022-09-12 | Disposition: A | Discharge status: Home / Self Care | Payer: 59 | Attending: Emergency Medicine | Admitting: Emergency Medicine

## 2022-09-12 DIAGNOSIS — R102 Pelvic and perineal pain unspecified side: Secondary | ICD-10-CM

## 2022-09-12 DIAGNOSIS — Z8719 Personal history of other diseases of the digestive system: Secondary | ICD-10-CM

## 2022-09-12 DIAGNOSIS — N9489 Other specified conditions associated with female genital organs and menstrual cycle: Secondary | ICD-10-CM

## 2022-09-12 DIAGNOSIS — K7689 Other specified diseases of liver: Secondary | ICD-10-CM | POA: Diagnosis not present

## 2022-09-12 DIAGNOSIS — R55 Syncope and collapse: Secondary | ICD-10-CM | POA: Diagnosis not present

## 2022-09-12 LAB — COMPREHENSIVE METABOLIC PANEL
ALT: 15 U/L (ref 0–44)
AST: 18 U/L (ref 15–41)
Albumin: 3.4 g/dL — ABNORMAL LOW (ref 3.5–5.0)
Alkaline Phosphatase: 39 U/L (ref 38–126)
Anion gap: 6 (ref 5–15)
BUN: 7 mg/dL (ref 6–20)
CO2: 23 mmol/L (ref 22–32)
Calcium: 8.5 mg/dL — ABNORMAL LOW (ref 8.9–10.3)
Chloride: 110 mmol/L (ref 98–111)
Creatinine, Ser: 0.7 mg/dL (ref 0.44–1.00)
GFR, Estimated: >60 mL/min (ref >60)
Glucose, Bld: 101 mg/dL — ABNORMAL HIGH (ref 70–99)
Potassium: 3.5 mmol/L (ref 3.5–5.1)
Sodium: 139 mmol/L (ref 135–145)
Total Bilirubin: 0.8 mg/dL (ref 0.3–1.2)
Total Protein: 5.5 g/dL — ABNORMAL LOW (ref 6.5–8.1)

## 2022-09-12 LAB — CBC WITH DIFFERENTIAL/PLATELET
Abs Immature Granulocytes: 0.01 10*3/uL (ref 0.00–0.07)
Basophils Absolute: 0.1 10*3/uL (ref 0.0–0.1)
Basophils Relative: 1 %
Eosinophils Absolute: 0.4 10*3/uL (ref 0.0–0.5)
Eosinophils Relative: 10 %
HCT: 35.4 % — ABNORMAL LOW (ref 36.0–46.0)
Hemoglobin: 11.4 g/dL — ABNORMAL LOW (ref 12.0–15.0)
Immature Granulocytes: 0 %
Lymphocytes Relative: 32 %
Lymphs Abs: 1.4 10*3/uL (ref 0.7–4.0)
MCH: 29.3 pg (ref 26.0–34.0)
MCHC: 32.2 g/dL (ref 30.0–36.0)
MCV: 91 fL (ref 80.0–100.0)
Monocytes Absolute: 0.3 10*3/uL (ref 0.1–1.0)
Monocytes Relative: 7 %
Neutro Abs: 2.2 10*3/uL (ref 1.7–7.7)
Neutrophils Relative %: 50 %
Platelets: 292 10*3/uL (ref 150–400)
RBC: 3.89 MIL/uL (ref 3.87–5.11)
RDW: 15.3 % (ref 11.5–15.5)
WBC: 4.4 10*3/uL (ref 4.0–10.5)
nRBC: 0 % (ref 0.0–0.2)

## 2022-09-12 LAB — TROPONIN I (HIGH SENSITIVITY): Troponin I (High Sensitivity): 3 ng/L (ref <18)

## 2022-09-12 MED ORDER — SODIUM CHLORIDE 0.9 % IV BOLUS
1000.0000 mL | Freq: Once | INTRAVENOUS | Status: AC
  Administered 2022-09-12: 1000 mL via INTRAVENOUS

## 2022-09-12 MED ORDER — LORAZEPAM 2 MG/ML IJ SOLN
1.0000 mg | Freq: Once | INTRAMUSCULAR | Status: AC
  Administered 2022-09-12: 1 mg via INTRAMUSCULAR
  Filled 2022-09-12: qty 1

## 2022-09-12 MED ORDER — LORAZEPAM 2 MG/ML IJ SOLN
0.5000 mg | Freq: Once | INTRAMUSCULAR | Status: AC
  Administered 2022-09-12: 0.5 mg via INTRAVENOUS
  Filled 2022-09-12: qty 1

## 2022-09-12 MED ORDER — GADOBUTROL 1 MMOL/ML IV SOLN
4.0000 mL | Freq: Once | INTRAVENOUS | Status: AC | PRN
  Administered 2022-09-12: 4 mL via INTRAVENOUS

## 2022-09-12 MED ORDER — OXYCODONE-ACETAMINOPHEN 5-325 MG PO TABS: 1.0000 | ORAL_TABLET | ORAL | 0 refills | Status: DC | PRN

## 2022-09-12 NOTE — ED Triage Notes (Signed)
Pt via POV from home. Pt states she was seen 12/19 and states that she has been having rectal pain for the past 2 months. States that's now she is having near syncopal episodes when she stand and consistently after for the past few days. Pt is A&OX4 and NAD

## 2022-09-12 NOTE — ED Provider Notes (Signed)
[facility] Provider Note   Event Date/Time   First MD Initiated Contact with Patient 09/12/22 [phone]     (approximate) History  [place]  HPI Lori Davies is a 50 y.o. female with a stated past medical history of rectal fissure and painful hemorrhoids currently being followed by outpatient general surgery as well has vascular surgery who presents for dizziness that has been progressive over the last 2 days.  Patient states that she has had poor p.o. intake over this time as she is "scared to go to the bathroom".  Patient states that she has significant bilateral lower abdominal quadrant pain whenever she eats or has a bowel movement that lasts all day and only relieves when she sleeps at night.  Patient states that she had a CT recently of the abdomen and pelvis that was concerning for pelvic congestion syndrome ROS: Patient currently denies any vision changes, tinnitus, difficulty speaking, facial droop, sore throat, chest pain, shortness of breath, nausea/vomiting/diarrhea, dysuria, or weakness/numbness/paresthesias in any extremity   Physical Exam  Triage Vital Signs: ED Triage Vitals  Enc Vitals Group     BP 09/12/22 0729 (!) 78/52     Pulse Rate 09/12/22 0729 88     Resp 09/12/22 0729 18     Temp 09/12/22 0729 98.2 F (36.8 C)     Temp Source 09/12/22 0729 Oral     SpO2 09/12/22 0729 100 %     Weight 09/12/22 0725 105 lb (47.6 kg)     Height 09/12/22 0725 '5\' 6"'$  (1.676 m)     Head Circumference --      Peak Flow --      Pain Score 09/12/22 0725 0     Pain Loc --      Pain Edu? --      Excl. in [place]? --    Most recent vital signs: Vitals:   09/12/22 1430 09/12/22 1500  BP: 125/72 101/78  Pulse: (!) 46 (!) 45  Resp: 11 17  Temp:    SpO2: 100% 99%   General: Awake, oriented x4. CV:  Good peripheral perfusion.  Resp:  Normal effort.  Abd:  No distention.  Other:  Cachectic middle-aged Caucasian female laying in bed in no acute distress ED  Results / Procedures / Treatments  Labs (all labs ordered are listed, but only abnormal results are displayed) Labs Reviewed  CBC WITH DIFFERENTIAL/PLATELET - Abnormal; Notable for the following components:      Result Value   Hemoglobin 11.4 (*)    HCT 35.4 (*)    All other components within normal limits  COMPREHENSIVE METABOLIC PANEL - Abnormal; Notable for the following components:   Glucose, Bld 101 (*)    Calcium 8.5 (*)    Total Protein 5.5 (*)    Albumin 3.4 (*)    All other components within normal limits  TROPONIN I (HIGH SENSITIVITY)   EKG ED ECG REPORT I, Naaman Plummer, the attending physician, personally viewed and interpreted this ECG. Date: 09/12/2022 EKG Time: 0745 Rate: 56 Rhythm: normal sinus rhythm QRS Axis: normal Intervals: normal ST/T Wave abnormalities: normal Narrative Interpretation: no evidence of acute ischemia RADIOLOGY ED MD interpretation: MR angiography of the abdomen and pelvis interpreted independently by me shows prominent left greater than right uterine and adnexal varices similar to findings seen in the setting of pelvic congestion syndrome -Agree with radiology assessment Official radiology report(s): MR ANGIO ABDOMEN W WO CONTRAST  Result Date: 09/12/2022 CLINICAL  DATA:  Concern for pelvic blood clots following buttocks injection into rectal area due to history of anal fistula EXAM: MRI ABDOMEN AND PELVIS WITHOUT AND WITH CONTRAST TECHNIQUE: Multiplanar multisequence MR imaging of the abdomen and pelvis was performed both before and after the administration of intravenous contrast. CONTRAST:  17m GADAVIST GADOBUTROL 1 MMOL/ML IV SOLN COMPARISON:  CT abdomen pelvis, 09/08/2022 FINDINGS: COMBINED FINDINGS FOR BOTH MR ABDOMEN AND PELVIS VASCULAR FINDINGS Normal contour and caliber of the abdominal aorta and pelvic iliac branch vessels. No evidence of aneurysm, dissection, or other acute aortic findings. No significant atherosclerosis by MR.  Standard branching pattern of the abdominal aorta with solitary bilateral renal arteries. Prominent, left-greater-than-right uterine and adnexal varices (series 20, image 39). Limited assessment of the left ovarian vein on this angiographic examination, which is not tailored for the assessment of the abdominal venous structures, however it appears somewhat tortuous and dilated, measuring up to 0.8 cm superiorly (series 24, image 39). Lower chest: No acute abnormality. Hepatobiliary: No obvious solid liver abnormality is seen. Slowly filling hemangioma of the liver dome, not optimally assessed on this arterial phase angiographic examination. No gallstones, gallbladder wall thickening, or biliary dilatation. Pancreas: Unremarkable. No pancreatic ductal dilatation or surrounding inflammatory changes. Spleen: Normal in size without significant abnormality. Adrenals/Urinary Tract: Adrenal glands are unremarkable. Kidneys are normal, without renal calculi, solid lesion, or hydronephrosis. Bladder is unremarkable. Stomach/Bowel: Stomach is within normal limits. Appendix appears normal. No evidence of bowel wall thickening, distention, or inflammatory changes. Specifically, no inflammatory findings of the low rectum on this examination, which does not include the anal verge and is not tailored for the evaluation of anal fistula. Lymphatic: No enlarged abdominal or pelvic lymph nodes. Reproductive: No mass. Other: No abdominal wall hernia or abnormality. No ascites. Musculoskeletal: No acute or significant osseous findings. IMPRESSION: 1. No obvious abdominal or pelvic venous thrombosis, on this angiographic examination which is not tailored for the evaluation of the veins. 2. No inflammatory findings of the low rectum on this examination, which does not include the anal verge and is not tailored for the evaluation of anal fistula. 3. Prominent, left-greater-than-right uterine and adnexal varices. Limited assessment of the  left ovarian vein on this angiographic examination, which is not tailored for the assessment of the abdominal veins, however it appears somewhat tortuous and dilated, measuring up to 0.8 cm superiorly. Findings can be seen in the setting of pelvic congestion. 4. Slowly filling hemangioma of the liver dome, not optimally assessed on this arterial phase angiographic examination. Electronically Signed   By: ADelanna AhmadiM.D.   On: 09/12/2022 14:10   MR ANGIO PELVIS W WO CONTRAST  Result Date: 09/12/2022 CLINICAL DATA:  Concern for pelvic blood clots following buttocks injection into rectal area due to history of anal fistula EXAM: MRI ABDOMEN AND PELVIS WITHOUT AND WITH CONTRAST TECHNIQUE: Multiplanar multisequence MR imaging of the abdomen and pelvis was performed both before and after the administration of intravenous contrast. CONTRAST:  452mGADAVIST GADOBUTROL 1 MMOL/ML IV SOLN COMPARISON:  CT abdomen pelvis, 09/08/2022 FINDINGS: COMBINED FINDINGS FOR BOTH MR ABDOMEN AND PELVIS VASCULAR FINDINGS Normal contour and caliber of the abdominal aorta and pelvic iliac branch vessels. No evidence of aneurysm, dissection, or other acute aortic findings. No significant atherosclerosis by MR. Standard branching pattern of the abdominal aorta with solitary bilateral renal arteries. Prominent, left-greater-than-right uterine and adnexal varices (series 20, image 39). Limited assessment of the left ovarian vein on this angiographic examination, which is not  tailored for the assessment of the abdominal venous structures, however it appears somewhat tortuous and dilated, measuring up to 0.8 cm superiorly (series 24, image 39). Lower chest: No acute abnormality. Hepatobiliary: No obvious solid liver abnormality is seen. Slowly filling hemangioma of the liver dome, not optimally assessed on this arterial phase angiographic examination. No gallstones, gallbladder wall thickening, or biliary dilatation. Pancreas: Unremarkable.  No pancreatic ductal dilatation or surrounding inflammatory changes. Spleen: Normal in size without significant abnormality. Adrenals/Urinary Tract: Adrenal glands are unremarkable. Kidneys are normal, without renal calculi, solid lesion, or hydronephrosis. Bladder is unremarkable. Stomach/Bowel: Stomach is within normal limits. Appendix appears normal. No evidence of bowel wall thickening, distention, or inflammatory changes. Specifically, no inflammatory findings of the low rectum on this examination, which does not include the anal verge and is not tailored for the evaluation of anal fistula. Lymphatic: No enlarged abdominal or pelvic lymph nodes. Reproductive: No mass. Other: No abdominal wall hernia or abnormality. No ascites. Musculoskeletal: No acute or significant osseous findings. IMPRESSION: 1. No obvious abdominal or pelvic venous thrombosis, on this angiographic examination which is not tailored for the evaluation of the veins. 2. No inflammatory findings of the low rectum on this examination, which does not include the anal verge and is not tailored for the evaluation of anal fistula. 3. Prominent, left-greater-than-right uterine and adnexal varices. Limited assessment of the left ovarian vein on this angiographic examination, which is not tailored for the assessment of the abdominal veins, however it appears somewhat tortuous and dilated, measuring up to 0.8 cm superiorly. Findings can be seen in the setting of pelvic congestion. 4. Slowly filling hemangioma of the liver dome, not optimally assessed on this arterial phase angiographic examination. Electronically Signed   By: Delanna Ahmadi M.D.   On: 09/12/2022 14:10   PROCEDURES: Critical Care performed: No Procedures MEDICATIONS ORDERED IN ED: Medications  sodium chloride 0.9 % bolus 1,000 mL (0 mLs Intravenous Stopped 09/12/22 0840)  LORazepam (ATIVAN) injection 0.5 mg (0.5 mg Intravenous Given 09/12/22 0944)  sodium chloride 0.9 % bolus  1,000 mL (0 mLs Intravenous Stopped 09/12/22 1356)  LORazepam (ATIVAN) injection 1 mg (1 mg Intramuscular Given 09/12/22 1223)  gadobutrol (GADAVIST) 1 MMOL/ML injection 4 mL (4 mLs Intravenous Contrast Given 09/12/22 1341)   IMPRESSION / MDM / ASSESSMENT AND PLAN / ED COURSE  I reviewed the triage vital signs and the nursing notes.                             The patient is on the cardiac monitor to evaluate for evidence of arrhythmia and/or significant heart rate changes. Patient's presentation is most consistent with acute presentation with potential threat to life or bodily function.  This patient presents to the ED for concern of dizziness and abdominal pain, this involves an extensive number of treatment options, and is a complaint that carries with it a high risk of complications and morbidity.  The differential diagnosis includes rectal fissure, dehydration, malnutrition, ACS, arrhythmia Co morbidities that complicate the patient evaluation  Rectal fissure, poor p.o. intake Additional history obtained:  Additional history obtained from husband at bedside  External records from outside source obtained and reviewed including recent telephone call to Dr. Peyton Najjar on 09/11/2022 Lab Tests:  I Ordered, and personally interpreted labs.  The pertinent results include: Stable anemia at H/H11.4/35.4, hypoproteinemia 5.5, hypoalbuminemia at 3.4, hypocalcemia at 8.5 likely consistent with the patient's poor p.o. intake Imaging Studies  ordered:  I ordered imaging studies including MRA of the abdomen/pelvis  I independently visualized and interpreted imaging which showed likely findings of pelvic congestion syndrome  I agree with the radiologist interpretation Cardiac Monitoring: / EKG:  The patient was maintained on a cardiac monitor.  I personally viewed and interpreted the cardiac monitored which showed an underlying rhythm of: Normal sinus rhythm Problem List / ED Course / Critical  interventions / Medication management  Orthostatic dizziness, abdominal pain  I ordered medication including Vicodin for abdominal pain  Reevaluation of the patient after these medicines showed that the patient improved  I have reviewed the patients home medicines and have made adjustments as needed  Test / Admission - Considered:  Considered admission for this patient however pain was decently controlled and they requested discharge and follow-up with OB/GYN as well as surgery for further treatment of her likely pelvic congestion syndrome Dispo: Discharge home with surgical follow-up       FINAL CLINICAL IMPRESSION(S) / ED DIAGNOSES   Final diagnoses:  Pelvic pain  Female pelvic congestion syndrome  History of anal fissures   Rx / DC Orders   ED Discharge Orders          Ordered    oxyCODONE-acetaminophen (PERCOCET) 5-325 MG tablet  Every 4 hours PRN        09/12/22 1448           Note:  This document was prepared using Dragon voice recognition software and may include unintentional dictation errors.   Naaman Plummer, MD 09/12/22 [phone]

## 2022-09-17 ENCOUNTER — Ambulatory Visit: Payer: 59 | Attending: General Surgery | Admitting: Physical Therapy

## 2022-09-17 ENCOUNTER — Encounter (INDEPENDENT_AMBULATORY_CARE_PROVIDER_SITE_OTHER): Payer: Self-pay | Admitting: Vascular Surgery

## 2022-09-17 ENCOUNTER — Encounter: Payer: Self-pay | Admitting: Physical Therapy

## 2022-09-17 ENCOUNTER — Encounter: Payer: Self-pay | Admitting: Family Medicine

## 2022-09-17 DIAGNOSIS — M6281 Muscle weakness (generalized): Secondary | ICD-10-CM | POA: Diagnosis not present

## 2022-09-17 DIAGNOSIS — M533 Sacrococcygeal disorders, not elsewhere classified: Secondary | ICD-10-CM | POA: Insufficient documentation

## 2022-09-17 DIAGNOSIS — R278 Other lack of coordination: Secondary | ICD-10-CM | POA: Diagnosis not present

## 2022-09-17 DIAGNOSIS — R2689 Other abnormalities of gait and mobility: Secondary | ICD-10-CM | POA: Diagnosis not present

## 2022-09-17 NOTE — Patient Instructions (Signed)
Follow up with colonoscopy asap  Consider nutritionist referral to increase nutrition intake
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
## 2022-09-22 ENCOUNTER — Ambulatory Visit (INDEPENDENT_AMBULATORY_CARE_PROVIDER_SITE_OTHER): Payer: 59 | Admitting: Vascular Surgery

## 2022-09-22 ENCOUNTER — Encounter: Payer: Self-pay | Admitting: Family Medicine

## 2022-09-22 ENCOUNTER — Encounter (INDEPENDENT_AMBULATORY_CARE_PROVIDER_SITE_OTHER): Payer: Self-pay | Admitting: Vascular Surgery

## 2022-09-22 VITALS — BP 87/67 | HR 89 | Resp 16 | Wt 103.0 lb

## 2022-09-22 DIAGNOSIS — N9489 Other specified conditions associated with female genital organs and menstrual cycle: Secondary | ICD-10-CM | POA: Diagnosis not present

## 2022-09-22 DIAGNOSIS — R7309 Other abnormal glucose: Secondary | ICD-10-CM

## 2022-09-22 NOTE — Assessment & Plan Note (Signed)
blood glucose control important in reducing the progression of atherosclerotic disease. Also, involved in wound healing. On appropriate medications.  

## 2022-09-22 NOTE — Assessment & Plan Note (Signed)
As part of her emergency room visit she underwent a CT scan and an MRI of the abdomen pelvis which I have independently reviewed.  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  Had a long discussion with she and her partner here today.  Her symptoms are not necessarily classic for pelvic congestion syndrome, but they are left pelvic pressure and pain and with her fissure healing there is not another obvious cause of her symptoms.  I have discussed the treatment for pelvic congestion syndrome which would generally be left gonadal vein embolization.  I discussed the procedure in detail including the risks and the benefits.  I discussed that I cannot ensure her that this will improve her symptoms, but I think it fairly likely given the characteristic of her symptoms.  She voices her understanding and desires to proceed with left gonadal vein embolization.

## 2022-09-22 NOTE — Progress Notes (Signed)
Patient ID: Lori Davies, female   DOB: Aug 06, 1971, 52 y.o.   MRN: [id number]  Chief Complaint  Patient presents with   New Patient (Initial Visit)    [facility] follow up pelvis pain    HPI Lori Davies is a 52 y.o. female.  I am asked to see the patient by Dr. Parks Ranger for evaluation of pelvic congestion syndrome.  The patient reports about a 74-monthhistory of problems that originated with hemorrhoid and anal fissure that required procedures over the past couple months.  These have been worsening and she has actually had emergency room visits as recently as a week ago for pelvic pain.  She feels a large amount of pressure in her left pelvis and rectal area.  This is in spite of of the fissure healing well and not having any obvious abnormalities noted by her surgeon.  She has been eating poorly and losing weight.  This has been progressively debilitating process and she is essentially unable to get up from a lying position at this point.  She denies any chest pain or shortness of breath.  Prior to 3 or 4 months ago, she was not really having any issues.  As part of her emergency room visit she underwent a CT scan and an MRI of the abdomen pelvis which I have independently reviewed.  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  Given this finding, she is referred for further evaluation and treatment   Past Medical History:  Diagnosis Date   Anxiety    Chronic anal fissure    [place]   Ectopic pregnancy 2016   History of kidney stones    Internal and external thrombosed hemorrhoids    Pre-diabetes     Past Surgical History:  Procedure Laterality Date   ADENOIDECTOMY     BOTOX INJECTION N/A 08/12/2022   Procedure: BOTOX INJECTION;  Surgeon: CHerbert Pun MD;  Location: [facility] ORS;  Service: General;  Laterality: N/A;     Family History  Problem Relation Age of  Onset   Heart disease Mother    Lung disease Mother    Heart disease Father    Hypertension Father    Breast cancer Maternal Aunt    Diabetes Maternal Aunt    Autism Son    Colon cancer Neg Hx       Social History   Tobacco Use   Smoking status: Former    Packs/day: 0.50    Years: 20.00    Total pack years: 10.00    Types: Cigarettes    Quit date: 03/21/2018    Years since quitting: 4.5   Smokeless tobacco: Never  Vaping Use   Vaping Use: Some days   Substances: Nicotine, Flavoring  Substance Use Topics   Alcohol use: No    Alcohol/week: 0.0 standard drinks of alcohol   Drug use: No     Allergies  Allergen Reactions   Clavulanic Acid Nausea And Vomiting   Fish Oil Hives   Sulfa Antibiotics Nausea Only    Current Outpatient Medications  Medication Sig Dispense Refill   diazepam (VALIUM) 5 MG tablet Take 1-2 tablets (5-10 mg total) by mouth every 12 (twelve) hours as needed for anxiety (muscle spasm). 60 tablet 0   montelukast (SINGULAIR) 10 MG tablet Take 1 tablet (10 mg total) by mouth at bedtime. 90 tablet 3  nitroGLYCERIN (NITROGLYN) 2 % OINT ointment Apply 1 Application topically as needed (with 0.2% lidocaine to rectum).     oxyCODONE-acetaminophen (PERCOCET) 5-325 MG tablet Take 1 tablet by mouth every 4 (four) hours as needed for severe pain. 20 tablet 0   UNABLE TO FIND Place 1 application  rectally 4 (four) times daily as needed. Med Name: diltiazem cream     escitalopram (LEXAPRO) 20 MG tablet Take 20 mg by mouth daily. (Patient not taking: Reported on 09/22/2022)     No current facility-administered medications for this visit.      REVIEW OF SYSTEMS (Negative unless checked)  Constitutional: '[]'$ Weight loss  '[]'$ Fever  '[]'$ Chills Cardiac: '[]'$ Chest pain   '[]'$ Chest pressure   '[]'$ Palpitations   '[]'$ Shortness of breath when laying flat   '[]'$ Shortness of breath at rest   '[]'$ Shortness of breath with exertion. Vascular:  '[]'$ Pain in legs with walking   '[]'$ Pain in legs at  rest   '[]'$ Pain in legs when laying flat   '[]'$ Claudication   '[]'$ Pain in feet when walking  '[]'$ Pain in feet at rest  '[]'$ Pain in feet when laying flat   '[]'$ History of DVT   '[]'$ Phlebitis   '[]'$ Swelling in legs   '[]'$ Varicose veins   '[]'$ Non-healing ulcers Pulmonary:   '[]'$ Uses home oxygen   '[]'$ Productive cough   '[]'$ Hemoptysis   '[]'$ Wheeze  '[x]'$ COPD   '[x]'$ Asthma Neurologic:  '[]'$ Dizziness  '[]'$ Blackouts   '[]'$ Seizures   '[]'$ History of stroke   '[]'$ History of TIA  '[]'$ Aphasia   '[]'$ Temporary blindness   '[]'$ Dysphagia   '[]'$ Weakness or numbness in arms   '[]'$ Weakness or numbness in legs Musculoskeletal:  '[]'$ Arthritis   '[]'$ Joint swelling   '[]'$ Joint pain   '[]'$ Low back pain Hematologic:  '[]'$ Easy bruising  '[]'$ Easy bleeding   '[]'$ Hypercoagulable state   '[]'$ Anemic  '[]'$ Hepatitis Gastrointestinal:  '[x]'$ Blood in stool   '[]'$ Vomiting blood  '[]'$ Gastroesophageal reflux/heartburn   '[x]'$ Abdominal pain Genitourinary:  '[]'$ Chronic kidney disease   '[]'$ Difficult urination  '[]'$ Frequent urination  '[]'$ Burning with urination   '[]'$ Hematuria Skin:  '[]'$ Rashes   '[]'$ Ulcers   '[]'$ Wounds Psychological:  '[x]'$ History of anxiety   '[]'$  History of major depression.    Physical Exam BP (!) 87/67 (BP Location: Left Arm)   Pulse 89   Resp 16   Wt 103 lb (46.7 kg)   LMP  (LMP Unknown)   BMI 16.62 kg/m  Gen:  thin, NAD but clearly in pain. Head: /AT, No temporalis wasting. Ear/Nose/Throat: Hearing grossly intact, nares w/o erythema or drainage, oropharynx w/o Erythema/Exudate Eyes: Conjunctiva clear, sclera non-icteric  Neck: trachea midline.  No JVD.  Pulmonary:  Good air movement, respirations not labored, no use of accessory muscles  Cardiac: RRR, no JVD Vascular:  Vessel Right Left  Radial Palpable Palpable                                   Gastrointestinal:. No masses, surgical incisions, or scars. Musculoskeletal: M/S 5/5 throughout.  Extremities without ischemic changes.  No deformity or atrophy. No edema. Neurologic: Sensation grossly intact in extremities.  Symmetrical.   Speech is fluent. Motor exam as listed above. Psychiatric: Judgment intact, somewhat anxious appearing Dermatologic: No rashes or ulcers noted.  No cellulitis or open wounds.    Radiology MR ANGIO ABDOMEN W WO CONTRAST  Result Date: 09/12/2022 CLINICAL DATA:  Concern for pelvic blood clots following buttocks injection into rectal area due to history of anal fistula EXAM: MRI ABDOMEN AND PELVIS WITHOUT  AND WITH CONTRAST TECHNIQUE: Multiplanar multisequence MR imaging of the abdomen and pelvis was performed both before and after the administration of intravenous contrast. CONTRAST:  79m GADAVIST GADOBUTROL 1 MMOL/ML IV SOLN COMPARISON:  CT abdomen pelvis, 09/08/2022 FINDINGS: COMBINED FINDINGS FOR BOTH MR ABDOMEN AND PELVIS VASCULAR FINDINGS Normal contour and caliber of the abdominal aorta and pelvic iliac branch vessels. No evidence of aneurysm, dissection, or other acute aortic findings. No significant atherosclerosis by MR. Standard branching pattern of the abdominal aorta with solitary bilateral renal arteries. Prominent, left-greater-than-right uterine and adnexal varices (series 20, image 39). Limited assessment of the left ovarian vein on this angiographic examination, which is not tailored for the assessment of the abdominal venous structures, however it appears somewhat tortuous and dilated, measuring up to 0.8 cm superiorly (series 24, image 39). Lower chest: No acute abnormality. Hepatobiliary: No obvious solid liver abnormality is seen. Slowly filling hemangioma of the liver dome, not optimally assessed on this arterial phase angiographic examination. No gallstones, gallbladder wall thickening, or biliary dilatation. Pancreas: Unremarkable. No pancreatic ductal dilatation or surrounding inflammatory changes. Spleen: Normal in size without significant abnormality. Adrenals/Urinary Tract: Adrenal glands are unremarkable. Kidneys are normal, without renal calculi, solid lesion, or hydronephrosis.  Bladder is unremarkable. Stomach/Bowel: Stomach is within normal limits. Appendix appears normal. No evidence of bowel wall thickening, distention, or inflammatory changes. Specifically, no inflammatory findings of the low rectum on this examination, which does not include the anal verge and is not tailored for the evaluation of anal fistula. Lymphatic: No enlarged abdominal or pelvic lymph nodes. Reproductive: No mass. Other: No abdominal wall hernia or abnormality. No ascites. Musculoskeletal: No acute or significant osseous findings. IMPRESSION: 1. No obvious abdominal or pelvic venous thrombosis, on this angiographic examination which is not tailored for the evaluation of the veins. 2. No inflammatory findings of the low rectum on this examination, which does not include the anal verge and is not tailored for the evaluation of anal fistula. 3. Prominent, left-greater-than-right uterine and adnexal varices. Limited assessment of the left ovarian vein on this angiographic examination, which is not tailored for the assessment of the abdominal veins, however it appears somewhat tortuous and dilated, measuring up to 0.8 cm superiorly. Findings can be seen in the setting of pelvic congestion. 4. Slowly filling hemangioma of the liver dome, not optimally assessed on this arterial phase angiographic examination. Electronically Signed   By: ADelanna AhmadiM.D.   On: 09/12/2022 14:10   MR ANGIO PELVIS W WO CONTRAST  Result Date: 09/12/2022 CLINICAL DATA:  Concern for pelvic blood clots following buttocks injection into rectal area due to history of anal fistula EXAM: MRI ABDOMEN AND PELVIS WITHOUT AND WITH CONTRAST TECHNIQUE: Multiplanar multisequence MR imaging of the abdomen and pelvis was performed both before and after the administration of intravenous contrast. CONTRAST:  449mGADAVIST GADOBUTROL 1 MMOL/ML IV SOLN COMPARISON:  CT abdomen pelvis, 09/08/2022 FINDINGS: COMBINED FINDINGS FOR BOTH MR ABDOMEN AND PELVIS  VASCULAR FINDINGS Normal contour and caliber of the abdominal aorta and pelvic iliac branch vessels. No evidence of aneurysm, dissection, or other acute aortic findings. No significant atherosclerosis by MR. Standard branching pattern of the abdominal aorta with solitary bilateral renal arteries. Prominent, left-greater-than-right uterine and adnexal varices (series 20, image 39). Limited assessment of the left ovarian vein on this angiographic examination, which is not tailored for the assessment of the abdominal venous structures, however it appears somewhat tortuous and dilated, measuring up to 0.8 cm superiorly (series 24, image  39). Lower chest: No acute abnormality. Hepatobiliary: No obvious solid liver abnormality is seen. Slowly filling hemangioma of the liver dome, not optimally assessed on this arterial phase angiographic examination. No gallstones, gallbladder wall thickening, or biliary dilatation. Pancreas: Unremarkable. No pancreatic ductal dilatation or surrounding inflammatory changes. Spleen: Normal in size without significant abnormality. Adrenals/Urinary Tract: Adrenal glands are unremarkable. Kidneys are normal, without renal calculi, solid lesion, or hydronephrosis. Bladder is unremarkable. Stomach/Bowel: Stomach is within normal limits. Appendix appears normal. No evidence of bowel wall thickening, distention, or inflammatory changes. Specifically, no inflammatory findings of the low rectum on this examination, which does not include the anal verge and is not tailored for the evaluation of anal fistula. Lymphatic: No enlarged abdominal or pelvic lymph nodes. Reproductive: No mass. Other: No abdominal wall hernia or abnormality. No ascites. Musculoskeletal: No acute or significant osseous findings. IMPRESSION: 1. No obvious abdominal or pelvic venous thrombosis, on this angiographic examination which is not tailored for the evaluation of the veins. 2. No inflammatory findings of the low rectum  on this examination, which does not include the anal verge and is not tailored for the evaluation of anal fistula. 3. Prominent, left-greater-than-right uterine and adnexal varices. Limited assessment of the left ovarian vein on this angiographic examination, which is not tailored for the assessment of the abdominal veins, however it appears somewhat tortuous and dilated, measuring up to 0.8 cm superiorly. Findings can be seen in the setting of pelvic congestion. 4. Slowly filling hemangioma of the liver dome, not optimally assessed on this arterial phase angiographic examination. Electronically Signed   By: Delanna Ahmadi M.D.   On: 09/12/2022 14:10   CT ABDOMEN PELVIS W CONTRAST  Result Date: 09/08/2022 CLINICAL DATA:  Left lower quadrant abdominal pain with weight loss x2 months. Severe rectal pressure. Decreased appetite. EXAM: CT ABDOMEN AND PELVIS WITH CONTRAST TECHNIQUE: Multidetector CT imaging of the abdomen and pelvis was performed using the standard protocol following bolus administration of intravenous contrast. RADIATION DOSE REDUCTION: This exam was performed according to the departmental dose-optimization program which includes automated exposure control, adjustment of the mA and/or kV according to patient size and/or use of iterative reconstruction technique. CONTRAST:  14m OMNIPAQUE IOHEXOL 300 MG/ML  SOLN COMPARISON:  CT chest August 27, 2015. FINDINGS: Lower chest: Linear bands of atelectasis versus scarring in the bilateral lung bases. Hepatobiliary: Hypodense 2.9 cm lesion in the right lobe of the liver on image 9/2 which demonstrates a peripheral nodular focus of enhancement with some filling in of the lesion with contrast on delayed imaging, present dating back to at least September 06, 2015 chest CT when it measured 2.7 cm compatible with a benign hepatic hemangioma. Additional lesion in the anterior right lobe of the liver measuring 7 mm on image 8/2 was also subtly evident on in  retrospect dating back to CT August 27, 2015 likely reflecting a benign hepatic hemangioma. Gallbladder is unremarkable. No biliary ductal dilation. Pancreas: No pancreatic ductal dilation or evidence of acute inflammation. Spleen: No splenomegaly or focal splenic lesion. Adrenals/Urinary Tract: Bilateral adrenal glands appear normal. No hydronephrosis. Nonobstructive 8 mm left lower pole renal stone. Kidneys demonstrate symmetric enhancement and excretion of contrast material. Urinary bladder is nondistended limiting evaluation. Stomach/Bowel: No radiopaque enteric contrast material was administered. Stomach is minimally distended limiting evaluation. No pathologic dilation of small or large bowel. Normal appendix. There are redundant loops of sigmoid and transverse colon. Colon is predominately decompressed limiting evaluation. Possible wall thickening through the anal canal  with hyperdense serpiginous bands extending through the anal canal for instance on image 15/5 possibly reflecting hemorrhoidal vessels. Vascular/Lymphatic: Normal caliber abdominal aorta. Early filling of a prominent left gonadal vein and left pelvic collateral vessels. Reproductive: Lobular uterine contour commonly reflects leiomyomas. Other: Trace pelvic free fluid is within physiologic normal limits. Musculoskeletal: No aggressive lytic or blastic lesion of bone. No acute osseous abnormality. IMPRESSION: 1. Possible wall thickening through the anal canal with hyperdense serpiginous bands extending through the anal canal, possibly reflecting hemorrhoidal vessels. Suggest further evaluation with direct visualization. 2. Early filling of a prominent left gonadal vein and left pelvic collateral vessels, which can be seen in the setting of pelvic venous insufficiency. 3. Nonobstructive 8 mm left lower pole renal stone. 4. Lobular uterine contour commonly reflects leiomyomas. Consider further evaluation with nonemergent pelvic ultrasound. 5.  Stable hypodense 2.9 cm lesion in the right lobe of the liver which demonstrates a peripheral nodular focus of enhancement with some filling in of the lesion with contrast on delayed imaging, present dating back to at least September 06, 2015 chest CT, compatible with a benign hepatic hemangioma. Additional 7 mm lesion in the anterior right lobe of the liver was also subtly evident on in retrospect dating back to CT August 27, 2015 likely reflecting a benign hepatic hemangioma. Electronically Signed   By: Dahlia Bailiff M.D.   On: 09/08/2022 18:07    Labs Recent Results (from the past 2160 hour(s))  Comprehensive metabolic panel     Status: Abnormal   Collection Time: 09/02/22 10:47 PM  Result Value Ref Range   Sodium 140 135 - 145 mmol/L   Potassium 3.1 (L) 3.5 - 5.1 mmol/L   Chloride 106 98 - 111 mmol/L   CO2 24 22 - 32 mmol/L   Glucose, Bld 109 (H) 70 - 99 mg/dL    Comment: Glucose reference range applies only to samples taken after fasting for at least 8 hours.   BUN 11 6 - 20 mg/dL   Creatinine, Ser 0.76 0.44 - 1.00 mg/dL   Calcium 9.1 8.9 - 10.3 mg/dL   Total Protein 6.7 6.5 - 8.1 g/dL   Albumin 4.3 3.5 - 5.0 g/dL   AST 21 15 - 41 U/L   ALT 15 0 - 44 U/L   Alkaline Phosphatase 52 38 - 126 U/L   Total Bilirubin 1.0 0.3 - 1.2 mg/dL   GFR, Estimated >60 >60 mL/min    Comment: (NOTE) Calculated using the CKD-EPI Creatinine Equation (2021)    Anion gap 10 5 - 15    Comment: Performed at [facility], [address] [place], [place] [address]  Ethanol     Status: None   Collection Time: 09/02/22 10:47 PM  Result Value Ref Range   Alcohol, Ethyl (B) <10 <10 mg/dL    Comment: (NOTE) Lowest detectable limit for serum alcohol is 10 mg/dL.  For medical purposes only. Performed at [facility], [address] [place], [place] [address]   Salicylate level     Status: Abnormal   Collection Time: 09/02/22 10:47 PM  Result Value Ref Range   Salicylate Lvl <1.8  (L) 7.0 - 30.0 mg/dL    Comment: Performed at [facility], [address] [place], [place] [address]  Acetaminophen level     Status: Abnormal   Collection Time: 09/02/22 10:47 PM  Result Value Ref Range   Acetaminophen (Tylenol), Serum <10 (L) 10 - 30 ug/mL    Comment: (NOTE) Therapeutic  concentrations vary significantly. A range of 10-30 ug/mL  may be an effective concentration for many patients. However, some  are best treated at concentrations outside of this range. Acetaminophen concentrations >150 ug/mL at 4 hours after ingestion  and >50 ug/mL at 12 hours after ingestion are often associated with  toxic reactions.  Performed at [facility], [address] [place], [place] [address]   cbc     Status: None   Collection Time: 09/02/22 10:47 PM  Result Value Ref Range   WBC 6.5 4.0 - 10.5 K/uL   RBC 4.97 3.87 - 5.11 MIL/uL   Hemoglobin 14.3 12.0 - 15.0 g/dL   HCT 43.6 36.0 - 46.0 %   MCV 87.7 80.0 - 100.0 fL   MCH 28.8 26.0 - 34.0 pg   MCHC 32.8 30.0 - 36.0 g/dL   RDW 13.5 11.5 - 15.5 %   Platelets 355 150 - 400 K/uL   nRBC 0.0 0.0 - 0.2 %    Comment: Performed at [facility], [address] [place], [place] [address]  Urinalysis, Routine w reflex microscopic     Status: Abnormal   Collection Time: 09/08/22  4:37 PM  Result Value Ref Range   Color, Urine STRAW (A) YELLOW   APPearance CLEAR (A) CLEAR   Specific Gravity, Urine 1.002 (L) 1.005 - 1.030   pH 8.0 5.0 - 8.0   Glucose, UA NEGATIVE NEGATIVE mg/dL   Hgb urine dipstick NEGATIVE NEGATIVE   Bilirubin Urine NEGATIVE NEGATIVE   Ketones, ur 5 (A) NEGATIVE mg/dL   Protein, ur NEGATIVE NEGATIVE mg/dL   Nitrite NEGATIVE NEGATIVE   Leukocytes,Ua NEGATIVE NEGATIVE    Comment: Performed at [facility], [address] [place], [place] [address]  CBC with Differential     Status: Abnormal   Collection Time: 09/08/22  4:37 PM  Result Value Ref Range   WBC 6.6 4.0 - 10.5 K/uL    RBC 5.54 (H) 3.87 - 5.11 MIL/uL   Hemoglobin 15.8 (H) 12.0 - 15.0 g/dL   HCT 48.2 (H) 36.0 - 46.0 %   MCV 87.0 80.0 - 100.0 fL   MCH 28.5 26.0 - 34.0 pg   MCHC 32.8 30.0 - 36.0 g/dL   RDW 13.5 11.5 - 15.5 %   Platelets 353 150 - 400 K/uL   nRBC 0.0 0.0 - 0.2 %   Neutrophils Relative % 59 %   Neutro Abs 3.9 1.7 - 7.7 K/uL   Lymphocytes Relative 27 %   Lymphs Abs 1.8 0.7 - 4.0 K/uL   Monocytes Relative 8 %   Monocytes Absolute 0.5 0.1 - 1.0 K/uL   Eosinophils Relative 5 %   Eosinophils Absolute 0.3 0.0 - 0.5 K/uL   Basophils Relative 1 %   Basophils Absolute 0.1 0.0 - 0.1 K/uL   Immature Granulocytes 0 %   Abs Immature Granulocytes 0.01 0.00 - 0.07 K/uL    Comment: Performed at [facility], [address] [place], [place] [address]  Comprehensive metabolic panel     Status: Abnormal   Collection Time: 09/08/22  4:37 PM  Result Value Ref Range   Sodium 140 135 - 145 mmol/L   Potassium 3.5 3.5 - 5.1 mmol/L   Chloride 103 98 - 111 mmol/L   CO2 24 22 - 32 mmol/L   Glucose, Bld 94 70 - 99 mg/dL    Comment: Glucose reference range applies only to samples taken after fasting for at least 8 hours.  BUN 9 6 - 20 mg/dL   Creatinine, Ser 0.61 0.44 - 1.00 mg/dL   Calcium 10.3 8.9 - 10.3 mg/dL   Total Protein 8.3 (H) 6.5 - 8.1 g/dL   Albumin 5.0 3.5 - 5.0 g/dL   AST 25 15 - 41 U/L   ALT 22 0 - 44 U/L   Alkaline Phosphatase 62 38 - 126 U/L   Total Bilirubin 1.0 0.3 - 1.2 mg/dL   GFR, Estimated >60 >60 mL/min    Comment: (NOTE) Calculated using the CKD-EPI Creatinine Equation (2021)    Anion gap 13 5 - 15    Comment: Performed at [facility], [address] [place], [place] [address]  Lipase, blood     Status: None   Collection Time: 09/08/22  4:37 PM  Result Value Ref Range   Lipase 36 11 - 51 U/L    Comment: Performed at [facility], [address] [place], [place] [address]  CBC with Differential     Status: Abnormal   Collection Time:  09/12/22  7:30 AM  Result Value Ref Range   WBC 4.4 4.0 - 10.5 K/uL   RBC 3.89 3.87 - 5.11 MIL/uL   Hemoglobin 11.4 (L) 12.0 - 15.0 g/dL   HCT 35.4 (L) 36.0 - 46.0 %   MCV 91.0 80.0 - 100.0 fL   MCH 29.3 26.0 - 34.0 pg   MCHC 32.2 30.0 - 36.0 g/dL   RDW 15.3 11.5 - 15.5 %   Platelets 292 150 - 400 K/uL   nRBC 0.0 0.0 - 0.2 %   Neutrophils Relative % 50 %   Neutro Abs 2.2 1.7 - 7.7 K/uL   Lymphocytes Relative 32 %   Lymphs Abs 1.4 0.7 - 4.0 K/uL   Monocytes Relative 7 %   Monocytes Absolute 0.3 0.1 - 1.0 K/uL   Eosinophils Relative 10 %   Eosinophils Absolute 0.4 0.0 - 0.5 K/uL   Basophils Relative 1 %   Basophils Absolute 0.1 0.0 - 0.1 K/uL   Immature Granulocytes 0 %   Abs Immature Granulocytes 0.01 0.00 - 0.07 K/uL    Comment: Performed at [facility], [place], [place] [address]  Troponin I (High Sensitivity)     Status: None   Collection Time: 09/12/22  7:30 AM  Result Value Ref Range   Troponin I (High Sensitivity) 3 <18 ng/L    Comment: (NOTE) Elevated high sensitivity troponin I (hsTnI) values and significant  changes across serial measurements may suggest ACS but many other  chronic and acute conditions are known to elevate hsTnI results.  Refer to the "Links" section for chest pain algorithms and additional  guidance. Performed at [facility], [address] [place], [place] [address]   Comprehensive metabolic panel     Status: Abnormal   Collection Time: 09/12/22  8:16 AM  Result Value Ref Range   Sodium 139 135 - 145 mmol/L   Potassium 3.5 3.5 - 5.1 mmol/L   Chloride 110 98 - 111 mmol/L   CO2 23 22 - 32 mmol/L   Glucose, Bld 101 (H) 70 - 99 mg/dL    Comment: Glucose reference range applies only to samples taken after fasting for at least 8 hours.   BUN 7 6 - 20 mg/dL   Creatinine, Ser 0.70 0.44 - 1.00 mg/dL   Calcium 8.5 (L) 8.9 - 10.3 mg/dL   Total Protein 5.5 (L) 6.5 - 8.1 g/dL   Albumin 3.4 (L)  3.5 - 5.0 g/dL    AST 18 15 - 41 U/L   ALT 15 0 - 44 U/L   Alkaline Phosphatase 39 38 - 126 U/L   Total Bilirubin 0.8 0.3 - 1.2 mg/dL   GFR, Estimated >60 >60 mL/min    Comment: (NOTE) Calculated using the CKD-EPI Creatinine Equation (2021)    Anion gap 6 5 - 15    Comment: Performed at [facility], [address], [place], [place] [address]    Assessment/Plan:  Pelvic congestive syndrome As part of her emergency room visit she underwent a CT scan and an MRI of the abdomen pelvis which I have independently reviewed.  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  Had a long discussion with she and her partner here today.  Her symptoms are not necessarily classic for pelvic congestion syndrome, but they are left pelvic pressure and pain and with her fissure healing there is not another obvious cause of her symptoms.  I have discussed the treatment for pelvic congestion syndrome which would generally be left gonadal vein embolization.  I discussed the procedure in detail including the risks and the benefits.  I discussed that I cannot ensure her that this will improve her symptoms, but I think it fairly likely given the characteristic of her symptoms.  She voices her understanding and desires to proceed with left gonadal vein embolization.  Elevated hemoglobin A1c blood glucose control important in reducing the progression of atherosclerotic disease. Also, involved in wound healing. On appropriate medications.      Leotis Pain 09/22/2022, 10:13 AM   This note was created with Dragon medical transcription system.  Any errors from dictation are unintentional.

## 2022-09-23 ENCOUNTER — Ambulatory Visit: Payer: 59 | Attending: General Surgery | Admitting: Physical Therapy

## 2022-09-23 DIAGNOSIS — R278 Other lack of coordination: Secondary | ICD-10-CM

## 2022-09-23 DIAGNOSIS — R2689 Other abnormalities of gait and mobility: Secondary | ICD-10-CM | POA: Diagnosis not present

## 2022-09-23 DIAGNOSIS — M6281 Muscle weakness (generalized): Secondary | ICD-10-CM | POA: Diagnosis not present

## 2022-09-23 DIAGNOSIS — M533 Sacrococcygeal disorders, not elsewhere classified: Secondary | ICD-10-CM

## 2022-09-23 NOTE — Patient Instructions (Addendum)
  Frog stretch: laying on belly with pillow under hips, knees bent, inhale do nothing, exhale let ankles fall apart  ___  Modified  pillow under belly,   Exhale :   L arm up  at a "V" thumbs up, shoulder down , chin tuck   Lengthen whole spine as if yard stick is balanced on spine, chin tucked    R leg lifts 15 deg up    L + R = 1 rep 10 reps   ____  1 min marching with both hands on counter, planting foot under  hips  Stand tall   Rest    Repeat 2nd set  ___  Face counter, side step , foot under hips L and then R  2 sets   ___   Sit on half folded towel to sit on sitting bones not tailbone
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
## 2022-09-25 ENCOUNTER — Telehealth (INDEPENDENT_AMBULATORY_CARE_PROVIDER_SITE_OTHER): Payer: Self-pay

## 2022-09-25 NOTE — Telephone Encounter (Addendum)
I attempted to contact the patient to schedule a left gonadal vein embolization with Dr. Lucky Cowboy. A message was left for a return call. Patient called back and was offered 09/28/22 to have her procedure, patient declined and wanted another date. Patient was offered 10/01/22 and stated that would work. Patient will [facility] at 8:30 am. Pre-procedure instructions were discussed and will be mailed.

## 2022-09-28 ENCOUNTER — Other Ambulatory Visit: Payer: Self-pay

## 2022-09-28 ENCOUNTER — Emergency Department: Payer: 59

## 2022-09-28 ENCOUNTER — Emergency Department
Admission: EM | Admit: 2022-09-28 | Discharge: 2022-09-28 | Disposition: A | Discharge status: Home / Self Care | Payer: 59 | Attending: Emergency Medicine | Admitting: Emergency Medicine

## 2022-09-28 DIAGNOSIS — R102 Pelvic and perineal pain: Secondary | ICD-10-CM | POA: Diagnosis not present

## 2022-09-28 DIAGNOSIS — K6289 Other specified diseases of anus and rectum: Secondary | ICD-10-CM

## 2022-09-28 DIAGNOSIS — R0789 Other chest pain: Secondary | ICD-10-CM | POA: Diagnosis not present

## 2022-09-28 LAB — BASIC METABOLIC PANEL
Anion gap: 9 (ref 5–15)
BUN: 10 mg/dL (ref 6–20)
CO2: 21 mmol/L — ABNORMAL LOW (ref 22–32)
Calcium: 9.6 mg/dL (ref 8.9–10.3)
Chloride: 108 mmol/L (ref 98–111)
Creatinine, Ser: 0.78 mg/dL (ref 0.44–1.00)
GFR, Estimated: >60 mL/min (ref >60)
Glucose, Bld: 100 mg/dL — ABNORMAL HIGH (ref 70–99)
Potassium: 3.4 mmol/L — ABNORMAL LOW (ref 3.5–5.1)
Sodium: 138 mmol/L (ref 135–145)

## 2022-09-28 LAB — CBC
HCT: 42 % (ref 36.0–46.0)
Hemoglobin: 13.4 g/dL (ref 12.0–15.0)
MCH: 29 pg (ref 26.0–34.0)
MCHC: 31.9 g/dL (ref 30.0–36.0)
MCV: 90.9 fL (ref 80.0–100.0)
Platelets: 334 10*3/uL (ref 150–400)
RBC: 4.62 MIL/uL (ref 3.87–5.11)
RDW: 13.3 % (ref 11.5–15.5)
WBC: 5.6 10*3/uL (ref 4.0–10.5)
nRBC: 0 % (ref 0.0–0.2)

## 2022-09-28 LAB — TROPONIN I (HIGH SENSITIVITY)
Troponin I (High Sensitivity): <2 ng/L (ref <18)
Troponin I (High Sensitivity): 3 ng/L (ref <18)

## 2022-09-28 MED ORDER — SODIUM CHLORIDE 0.9 % IV BOLUS
1000.0000 mL | Freq: Once | INTRAVENOUS | Status: AC
  Administered 2022-09-28: 1000 mL via INTRAVENOUS

## 2022-09-28 NOTE — ED Provider Triage Note (Signed)
Emergency Medicine Provider Triage Evaluation Note  Lori Davies , a 52 y.o. female  was evaluated in triage.  Pt complains of chest pain or shortness of, low blood pressure.  Patient called the surgeon about her rectal pain and they told her to come to the emergency department..  Review of Systems  Positive:  Negative:   Physical Exam  BP 96/71   Pulse 79   Temp 98 F (36.7 C)   Resp 16   Ht '5\' 6"'$  (1.676 m)   Wt 45 kg   LMP  (LMP Unknown)   SpO2 99%   BMI 16.01 kg/m  Gen:   Awake, no distress   Resp:  Normal effort  MSK:   Moves extremities without difficulty  Other:    Medical Decision Making  Medically screening exam initiated at 12:47 PM.  Appropriate orders placed.  Lori Davies was informed that the remainder of the evaluation will be completed by another provider, this initial triage assessment does not replace that evaluation, and the importance of remaining in the ED until their evaluation is complete.     Versie Starks, PA-C 09/28/22 1247

## 2022-09-28 NOTE — ED Notes (Signed)
E signature pad not working. Pt educated on discharge instructions and verbalized understanding.  

## 2022-09-28 NOTE — ED Provider Notes (Signed)
[facility] Provider Note    Event Date/Time   First MD Initiated Contact with Patient 09/28/22 1620     (approximate)   History   Chest Pain   HPI  Lori Davies is a 52 y.o. female with a history of hemorrhoids and suspected anal fissure who presents with chronic and ongoing rectal pain for the last several months.  The patient states that she had a panic attack today due to the pain which is why she was registered as chest pain, but denies any actual chest pain at this time.  The patient states that she has been seen by multiple physicians for the rectal pain including several specialist but has not gotten any definitive solutions.  She states that she has not been eating much due to her fear over the pain when she has a bowel movement and as a result feels weak and lightheaded.  She states she felt lightheaded when she stood up today.  The patient denies any abdominal pain, vomiting, fever or chills, rectal bleeding, or other acute symptoms.  She has been hesitant to take pain medication due to the potential for constipation.  I reviewed the past medical records.  The patient was seen in the ED on 12/23 and 12/19 due to this rectal pain.  She was evaluated by Dr. Peyton Najjar from general surgery and had a Botox treatment.  She also then followed up in November with colorectal surgery in [place]; exam under anesthesia and surgery was recommended but she declined at that time.  She was also seen by vascular surgery and has a vascular procedure planned for possible pelvic congestion syndrome on 1/11.  The patient states that she called the nurse for Dr. Deniece Ree office and was told to come to the ED for further evaluation.   Physical Exam   Triage Vital Signs: ED Triage Vitals  Enc Vitals Group     BP 09/28/22 1243 96/71     Pulse Rate 09/28/22 1243 79     Resp 09/28/22 1243 16     Temp 09/28/22 1243 98 F (36.7 C)     Temp src --      SpO2 09/28/22 1243 99  %     Weight 09/28/22 1245 99 lb 3.3 oz (45 kg)     Height 09/28/22 1245 '5\' 6"'$  (1.676 m)     Head Circumference --      Peak Flow --      Pain Score 09/28/22 1244 4     Pain Loc --      Pain Edu? --      Excl. in [place]? --     Most recent vital signs: Vitals:   09/28/22 1616 09/28/22 1830  BP: (!) 81/65 99/69  Pulse: 86 88  Resp: 18 18  Temp: 98 F (36.7 C) 98 F (36.7 C)  SpO2: 100% 100%    General: Awake, no distress.  CV:  Good peripheral perfusion.  Resp:  Normal effort.  Abd:  Soft and nontender.  No distention.  Other:  External rectal exam reveals small nonthrombosed hemorrhoid, no visible fissure or other lesion, and no bleeding.   ED Results / Procedures / Treatments   Labs (all labs ordered are listed, but only abnormal results are displayed) Labs Reviewed  BASIC METABOLIC PANEL - Abnormal; Notable for the following components:      Result Value   Potassium 3.4 (*)    CO2 21 (*)    Glucose, Bld  100 (*)    All other components within normal limits  CBC  TROPONIN I (HIGH SENSITIVITY)  TROPONIN I (HIGH SENSITIVITY)     EKG  ED ECG REPORT I, Arta Silence, the attending physician, personally viewed and interpreted this ECG.  Date: 09/28/2022 EKG Time: 1259 Rate: 79 Rhythm: normal sinus rhythm QRS Axis: Right axis Intervals: normal ST/T Wave abnormalities: normal Narrative Interpretation: no evidence of acute ischemia; no significant change when compared to EKG of 09/12/2022    RADIOLOGY   PROCEDURES:  Critical Care performed: No  Procedures   MEDICATIONS ORDERED IN ED: Medications  sodium chloride 0.9 % bolus 1,000 mL (0 mLs Intravenous Stopped 09/28/22 1949)     IMPRESSION / MDM / ASSESSMENT AND PLAN / ED COURSE  I reviewed the triage vital signs and the nursing notes.  52 year old female with PMH as noted above presents with persistent ongoing rectal pain for the last several months with no acute change today.  The patient has  been diagnosed with pelvic congestion syndrome and there is also concern for anal fissure.  She is planned for a vascular surgery procedure in a few days but declined exam under anesthesia and surgery by colorectal surgery.  Physical exam is unremarkable at this time.  The patient's blood pressure is borderline low although she states that it run somewhat low at baseline.  She appears well-perfused.  Initial labs reveal normal electrolytes and no leukocytosis.  Cardiac enzymes were ordered due to the patient's initial report of panic attacks/chest discomfort, but her troponins are negative x 2 and EKG is nonischemic.  Differential diagnosis includes, but is not limited to, anal fissure, pelvic syndrome, other chronic anorectal condition.  There is no evidence of any acute process today.  Patient's presentation is most consistent with exacerbation of chronic illness.  I consulted Dr. Lysle Pearl from general surgery and discussed the case with him.  He advised that there were no further recommendations for any ED treatment or workup given the patient has already had multiple imaging studies and has already seen colorectal surgery.  The patient is borderline orthostatic likely due to some dehydration from decreased p.o. intake.  We will give fluids.  However there is no medication for additional imaging or workup in the ED.  I anticipate discharge home with continued plan for outpatient follow-up.  ----------------------------------------- 9:23 PM on 09/28/2022 -----------------------------------------  The patient is feeling significantly better after fluids and appears well.  I had an extensive discussion with her about the workup she has received so far, the recommendations of the surgery consultant, the follow-up plan with vascular, and how best to obtain a consultation and second opinion from another colorectal surgeon; I answered all of her questions.  At this time the patient is stable for discharge  home; there is no indication for inpatient admission.  She has a follow-up plan.  I gave her strict return precautions and she expressed understanding.   FINAL CLINICAL IMPRESSION(S) / ED DIAGNOSES   Final diagnoses:  Rectal pain     Rx / DC Orders   ED Discharge Orders     None        Note:  This document was prepared using Dragon voice recognition software and may include unintentional dictation errors.    Arta Silence, MD 09/28/22 2204

## 2022-09-28 NOTE — Discharge Instructions (Signed)
Follow-up with the vascular surgeon as planned as well as with a gastroenterologist and colorectal surgeon as discussed.  Return to the ER for new, worsening, or persistent severe pain, a change in the pattern or location of the pain, new or worsening abdominal pain, vomiting, fevers, blood in the stool or bleeding from the rectum, weakness or lightheadedness, or any other new or worsening symptoms that concern you.

## 2022-09-28 NOTE — ED Triage Notes (Signed)
Pt to ED for right sided chest pain that started this morning. +shob. Denies n/v. NAD noted Also reports continued pain to rectum, has been seen multiple times for same.

## 2022-09-30 ENCOUNTER — Telehealth (INDEPENDENT_AMBULATORY_CARE_PROVIDER_SITE_OTHER): Payer: Self-pay

## 2022-09-30 ENCOUNTER — Encounter: Payer: 59 | Admitting: Physical Therapy

## 2022-09-30 DIAGNOSIS — K6289 Other specified diseases of anus and rectum: Secondary | ICD-10-CM | POA: Diagnosis not present

## 2022-09-30 DIAGNOSIS — R634 Abnormal weight loss: Secondary | ICD-10-CM | POA: Diagnosis not present

## 2022-09-30 DIAGNOSIS — R933 Abnormal findings on diagnostic imaging of other parts of digestive tract: Secondary | ICD-10-CM | POA: Diagnosis not present

## 2022-09-30 DIAGNOSIS — K600 Acute anal fissure: Secondary | ICD-10-CM | POA: Diagnosis not present

## 2022-09-30 NOTE — Telephone Encounter (Signed)
Patient called in stating that she was seen by another provider and was told that she should not have the  left gonadal vein embolization with Dr. Lucky Cowboy at this time. Patient stated she wanted her procedure canceled. The procedure has been canceled.

## 2022-10-01 ENCOUNTER — Encounter: Admission: RE | Payer: Self-pay | Source: Ambulatory Visit

## 2022-10-01 ENCOUNTER — Encounter: Payer: Self-pay | Admitting: Family Medicine

## 2022-10-01 ENCOUNTER — Ambulatory Visit: Admission: RE | Admit: 2022-10-01 | Payer: 59 | Source: Ambulatory Visit | Admitting: Vascular Surgery

## 2022-10-01 DIAGNOSIS — K602 Anal fissure, unspecified: Secondary | ICD-10-CM

## 2022-10-01 DIAGNOSIS — N9489 Other specified conditions associated with female genital organs and menstrual cycle: Secondary | ICD-10-CM

## 2022-10-01 SURGERY — EMBOLIZATION
Anesthesia: Moderate Sedation

## 2022-10-01 MED ORDER — CEPHALEXIN 500 MG PO CAPS: 500.0000 mg | ORAL_CAPSULE | Freq: Three times a day (TID) | ORAL | 0 refills | Status: DC

## 2022-10-05 ENCOUNTER — Encounter: Payer: Self-pay | Admitting: Family Medicine

## 2022-10-07 ENCOUNTER — Telehealth: Payer: Self-pay | Admitting: Physical Therapy

## 2022-10-07 ENCOUNTER — Ambulatory Visit: Payer: 59 | Admitting: Physical Therapy

## 2022-10-07 NOTE — Telephone Encounter (Signed)
Physical Therapist called pt to check up on her missing past 2 weeks.   Pt reported since her last PT session which addressed her tailbone, pt has not had any tailbone pain anymore. Pt has been able to perform the upright and walking and in bed exercises. Pt has been walking up to 2 miles a day at 10 min increments. Pt has started eating 3 meals again . Aunt is staying with her because her husband has to work. Pt experiences pain with anal fissure now.   Pt was encouraged and reinforced to have hope and patience . Therapist discussed the importance of maintaining nutrition and muscle mass to get better and advised pt to continue with her mobility and upright activities to avoid being bedridden.  Plan to assess anal fissure and pelvic floor at next session. Wished pt a Happy Rudene Anda which is in 3 days.   Pt agreed to be at next appt.

## 2022-10-14 ENCOUNTER — Ambulatory Visit: Payer: 59 | Admitting: Physical Therapy

## 2022-10-15 ENCOUNTER — Encounter: Payer: Self-pay | Admitting: Family Medicine

## 2022-10-15 DIAGNOSIS — K640 First degree hemorrhoids: Secondary | ICD-10-CM | POA: Diagnosis not present

## 2022-10-15 DIAGNOSIS — K594 Anal spasm: Secondary | ICD-10-CM | POA: Diagnosis not present

## 2022-10-16 ENCOUNTER — Encounter: Payer: Self-pay | Admitting: Family Medicine

## 2022-10-16 ENCOUNTER — Telehealth: Payer: 59 | Admitting: Family Medicine

## 2022-10-16 VITALS — Ht 66.0 in | Wt 99.0 lb

## 2022-10-16 DIAGNOSIS — F410 Panic disorder [episodic paroxysmal anxiety] without agoraphobia: Secondary | ICD-10-CM

## 2022-10-16 DIAGNOSIS — K594 Anal spasm: Secondary | ICD-10-CM

## 2022-10-16 DIAGNOSIS — G8929 Other chronic pain: Secondary | ICD-10-CM

## 2022-10-16 DIAGNOSIS — F411 Generalized anxiety disorder: Secondary | ICD-10-CM

## 2022-10-16 DIAGNOSIS — R69 Illness, unspecified: Secondary | ICD-10-CM | POA: Diagnosis not present

## 2022-10-16 DIAGNOSIS — K6289 Other specified diseases of anus and rectum: Secondary | ICD-10-CM

## 2022-10-16 MED ORDER — DIAZEPAM 5 MG PO TABS: 5.0000 mg | ORAL_TABLET | Freq: Two times a day (BID) | ORAL | 2 refills | Status: DC | PRN

## 2022-10-16 MED ORDER — DULOXETINE HCL 30 MG PO CPEP: 30.0000 mg | ORAL_CAPSULE | Freq: Every day | ORAL | 2 refills | Status: DC

## 2022-10-16 NOTE — Patient Instructions (Addendum)
Thank you for coming to the office today.  Let me now if any of these locations accept your insurance and would be able to see you for Rectal / Anal Pain and which you prefer, I can contact them directly if there is still any question if they can treat it or not. I can refer ASAP after that.  Diazepam and Duloxetine (Cymbalta) have been ordered.  CHRONIC PAIN MANAGEMENT  [facility] Address: [address], [place], [place] [address] Phone: [phone]  [facility] Ph: [phone] Fax: [phone]  [facility] [place] [address] [place], [place] [address] Phone: [phone] Fax: [phone]  [facility] [place], [place] [address] Ph: [phone] (Multiple locations in [place] / [place])  [facility] [address] [place], [place]  [address] Appointments: [phone]  [facility] Address: [address] [place], [place] [address] Phone: [phone]   Please schedule a Follow-up Appointment to: No follow-ups on file.  If you have any other questions or concerns, please feel free to call the office or send a message through [facility] [place]. You may also schedule an earlier appointment if necessary.  Additionally, you may be receiving a survey about your experience at our office within a few days to 1 week by e-mail or mail. We value your feedback.  [name], DO [place]

## 2022-10-16 NOTE — Telephone Encounter (Signed)
See other duplicate mychart message  [name], DO [facility] 10/16/2022, 6:52 PM

## 2022-10-16 NOTE — Telephone Encounter (Signed)
See documentation from office visit today. Closing this mychart visit  [name], [facility] 10/16/2022, 6:44 PM

## 2022-10-16 NOTE — Progress Notes (Signed)
Subjective:    Patient ID: [name], female    DOB: [birth year], 52 y.o.   MRN: [id number]  Lori Davies is a 52 y.o. female presenting on 10/16/2022 for Rectal Pain  Virtual / Telehealth Encounter - Video Visit via MyChart The purpose of this virtual visit is to provide medical care while limiting exposure to the novel coronavirus (COVID19) for both patient and office staff.  Consent was obtained for remote visit:  Yes.   Answered questions that patient had about telehealth interaction:  Yes.   I discussed the limitations, risks, security and privacy concerns of performing an evaluation and management service by video/telephone. I also discussed with the patient that there may be a patient responsible charge related to this service. The patient expressed understanding and agreed to proceed.  Patient Location: Home Provider Location: [facility] (Office)  Participants in virtual visit: - Patient: [name] - CMA: Orinda Kenner, [place] - Provider: Dr Parks Ranger   HPI  Rectal / Anal Pain Rectal Spasms Anxiety  She saw Colorectal Surgeon yesterday Marijo File) [facility]  He did an internal checkup, he did not see a fissure anymore  Her pain is localized 3 inches into rectum. Did not believe it was abscess or fistula or infection.  Rectal specialist does not know why she is having the pain. They recommended a rectal MRI, not a pelvic MRI.  Asking about Diazepam longer course to continue since it is only thing that works for pain / rectal spasms and something else for anxiety instead of Lexapro, no longer working. Also failed Sertraline Buspar in the past.  She usually takes Diazepam '5mg'$  x 2 '10mg'$  nightly AS NEEDED  Additionally BP 80/60s  She is asking about referrals to Pain Management specialist or Pelvic Floor Specialist.  Admits weight loss and low BP due to some poor appetite at times due to pain.  Followed by Physical  Therapy - [facility] > ask her if she can do Pelvic floor therapy or someone else, new referral?  She admits cannot move after morning bowel movement due to pain.  Future colonoscopy for routine screening, unrelated to her rectal pain.      08/26/2022    5:15 PM 08/07/2022   10:06 AM 04/20/2022   11:19 AM  Depression screen PHQ 2/9  Decreased Interest 3 3 0  Down, Depressed, Hopeless 3 3 0  PHQ - 2 Score 6 6 0  Altered sleeping 2 2 0  Tired, decreased energy '3 3 1  '$ Change in appetite 3 3 0  Feeling bad or failure about yourself  3 3 0  Trouble concentrating 3 3 0  Moving slowly or fidgety/restless 2 2 0  Suicidal thoughts 1 2 0  PHQ-9 Score '23 24 1  '$ Difficult doing work/chores Extremely dIfficult Extremely dIfficult Not difficult at all    Social History   Tobacco Use   Smoking status: Former    Packs/day: 0.50    Years: 20.00    Total pack years: 10.00    Types: Cigarettes    Quit date: 03/21/2018    Years since quitting: 4.5   Smokeless tobacco: Never  Vaping Use   Vaping Use: Some days   Substances: Nicotine, Flavoring  Substance Use Topics   Alcohol use: No    Alcohol/week: 0.0 standard drinks of alcohol   Drug use: No    Review of Systems Per HPI unless specifically indicated above  Objective:    Ht '5\' 6"'$  (1.676 m)   Wt 99 lb (44.9 kg)   LMP  (LMP Unknown)   BMI 15.98 kg/m   Wt Readings from Last 3 Encounters:  10/16/22 99 lb (44.9 kg)  09/28/22 99 lb 3.3 oz (45 kg)  09/22/22 103 lb (46.7 kg)    Physical Exam  Note examination was completely remotely via video observation objective data only  Gen - well-appearing, no acute distress or apparent pain, comfortable HEENT - eyes appear clear without discharge or redness Heart/Lungs - cannot examine virtually - observed no evidence of coughing or labored breathing. Abd - cannot examine virtually  Skin - face visible today- no rash Neuro - awake, alert, oriented Psych - not anxious  appearing   Last MR Pelvis 09/12/22  CLINICAL DATA:  Concern for pelvic blood clots following buttocks injection into rectal area due to history of anal fistula   EXAM: MRI ABDOMEN AND PELVIS WITHOUT AND WITH CONTRAST   TECHNIQUE: Multiplanar multisequence MR imaging of the abdomen and pelvis was performed both before and after the administration of intravenous contrast.   CONTRAST:  87m GADAVIST GADOBUTROL 1 MMOL/ML IV SOLN   COMPARISON:  CT abdomen pelvis, 09/08/2022   FINDINGS: COMBINED FINDINGS FOR BOTH MR ABDOMEN AND PELVIS   VASCULAR FINDINGS   Normal contour and caliber of the abdominal aorta and pelvic iliac branch vessels. No evidence of aneurysm, dissection, or other acute aortic findings. No significant atherosclerosis by MR. Standard branching pattern of the abdominal aorta with solitary bilateral renal arteries.   Prominent, left-greater-than-right uterine and adnexal varices (series 20, image 39). Limited assessment of the left ovarian vein on this angiographic examination, which is not tailored for the assessment of the abdominal venous structures, however it appears somewhat tortuous and dilated, measuring up to 0.8 cm superiorly (series 24, image 39).   Lower chest: No acute abnormality.   Hepatobiliary: No obvious solid liver abnormality is seen. Slowly filling hemangioma of the liver dome, not optimally assessed on this arterial phase angiographic examination. No gallstones, gallbladder wall thickening, or biliary dilatation.   Pancreas: Unremarkable. No pancreatic ductal dilatation or surrounding inflammatory changes.   Spleen: Normal in size without significant abnormality.   Adrenals/Urinary Tract: Adrenal glands are unremarkable. Kidneys are normal, without renal calculi, solid lesion, or hydronephrosis. Bladder is unremarkable.   Stomach/Bowel: Stomach is within normal limits. Appendix appears normal. No evidence of bowel wall thickening,  distention, or inflammatory changes. Specifically, no inflammatory findings of the low rectum on this examination, which does not include the anal verge and is not tailored for the evaluation of anal fistula.   Lymphatic: No enlarged abdominal or pelvic lymph nodes.   Reproductive: No mass.   Other: No abdominal wall hernia or abnormality. No ascites.   Musculoskeletal: No acute or significant osseous findings.   IMPRESSION: 1. No obvious abdominal or pelvic venous thrombosis, on this angiographic examination which is not tailored for the evaluation of the veins. 2. No inflammatory findings of the low rectum on this examination, which does not include the anal verge and is not tailored for the evaluation of anal fistula. 3. Prominent, left-greater-than-right uterine and adnexal varices. Limited assessment of the left ovarian vein on this angiographic examination, which is not tailored for the assessment of the abdominal veins, however it appears somewhat tortuous and dilated, measuring up to 0.8 cm superiorly. Findings can be seen in the setting of pelvic congestion. 4. Slowly filling hemangioma of the liver dome,  not optimally assessed on this arterial phase angiographic examination.     Electronically Signed   By: Delanna Ahmadi M.D.   On: 09/12/2022 14:10   Results for orders placed or performed during the hospital encounter of 43/15/40  Basic metabolic panel  Result Value Ref Range   Sodium 138 135 - 145 mmol/L   Potassium 3.4 (L) 3.5 - 5.1 mmol/L   Chloride 108 98 - 111 mmol/L   CO2 21 (L) 22 - 32 mmol/L   Glucose, Bld 100 (H) 70 - 99 mg/dL   BUN 10 6 - 20 mg/dL   Creatinine, Ser 0.78 0.44 - 1.00 mg/dL   Calcium 9.6 8.9 - 10.3 mg/dL   GFR, Estimated >60 >60 mL/min   Anion gap 9 5 - 15  CBC  Result Value Ref Range   WBC 5.6 4.0 - 10.5 K/uL   RBC 4.62 3.87 - 5.11 MIL/uL   Hemoglobin 13.4 12.0 - 15.0 g/dL   HCT 42.0 36.0 - 46.0 %   MCV 90.9 80.0 - 100.0 fL    MCH 29.0 26.0 - 34.0 pg   MCHC 31.9 30.0 - 36.0 g/dL   RDW 13.3 11.5 - 15.5 %   Platelets 334 150 - 400 K/uL   nRBC 0.0 0.0 - 0.2 %  Troponin I (High Sensitivity)  Result Value Ref Range   Troponin I (High Sensitivity) <2 <18 ng/L  Troponin I (High Sensitivity)  Result Value Ref Range   Troponin I (High Sensitivity) 3 <18 ng/L      Assessment & Plan:   Problem List Items Addressed This Visit   None Visit Diagnoses     Rectal pain, chronic    -  Primary   Relevant Medications   DULoxetine (CYMBALTA) 30 MG capsule   Generalized anxiety disorder with panic attacks       Relevant Medications   diazepam (VALIUM) 5 MG tablet   DULoxetine (CYMBALTA) 30 MG capsule   Rectal spasm       Relevant Medications   diazepam (VALIUM) 5 MG tablet       Discussion today on rectal pain issue Limited success with specialist management so far She has seen various general surgery and rectal specialists Recently Colorectal / Proctology in [place], see HPI above No etiology identified on detailed exam Advised Rectal MRI next but they are out of network and cannot order it.  Has had work up by vascular for concern of Pelvic Congestion Syndrome as a possible cause of her symptoms and pain.  She had Pelvic MRI, seen above.  She has cancelled this embolization procedure at this time.  I am going to refer her to Pain Management specialist in the interval for now and also Pelvic Floor Therapy.  She has current PT. She will coordinate with them and ask if they can pursue the Pelvic Floor PT. I will route chart for review and ask if she can pursue pelvic floor PT  Re order Diazepam '5mg'$  take 1-2 AT NIGHT AS NEEDED pain anxiety spasms. Caution with overuse of BDZ, she is limited to once per day usually but has if needed  Off SSRI previously Will start SNRI Duloxetine '30mg'$  daily for mood/anxiety and pain properties to help manage her chronic pain.  Follow up with her once we have her on track to  see Pain Management / improved anxiety / pelvic floor PT.  We can revisit Rectal MRI if indicated. But at this time she was not planning for any other  procedure.   Meds ordered this encounter  Medications   diazepam (VALIUM) 5 MG tablet    Sig: Take 1-2 tablets (5-10 mg total) by mouth every 12 (twelve) hours as needed for anxiety (muscle spasm).    Dispense:  60 tablet    Refill:  2   DULoxetine (CYMBALTA) 30 MG capsule    Sig: Take 1 capsule (30 mg total) by mouth daily.    Dispense:  30 capsule    Refill:  2      Follow up plan: Return if symptoms worsen or fail to improve.   Patient verbalizes understanding with the above medical recommendations including the limitation of remote medical advice.  Specific follow-up and call-back criteria were given for patient to follow-up or seek medical care more urgently if needed.  Total duration of direct patient care provided via video conference: 30 minutes   [name], [facility] 10/16/2022, 11:40 AM

## 2022-10-19 ENCOUNTER — Encounter (INDEPENDENT_AMBULATORY_CARE_PROVIDER_SITE_OTHER): Payer: Self-pay

## 2022-10-21 ENCOUNTER — Telehealth: Payer: Self-pay | Admitting: Physical Therapy

## 2022-10-21 ENCOUNTER — Ambulatory Visit: Payer: 59 | Admitting: Physical Therapy

## 2022-10-21 NOTE — Telephone Encounter (Signed)
Physical therapist left a voicemail regarding past appointments which pt did not show up for. Base don last conversation 3 weeks ago, pt voiced she planned to come to upcoming appts.  Pt was explained on voicemail message that Clinic has a policy to cancel remaining appointments after 2+ no shows. Asked pt to call to reschedule one at a time if pt still wanted to continue with PT.  Provided encouragement with her progress.

## 2022-10-22 ENCOUNTER — Ambulatory Visit: Payer: 59 | Admitting: Psychiatry

## 2022-10-26 DIAGNOSIS — R69 Illness, unspecified: Secondary | ICD-10-CM | POA: Diagnosis not present

## 2022-10-26 DIAGNOSIS — D259 Leiomyoma of uterus, unspecified: Secondary | ICD-10-CM | POA: Diagnosis not present

## 2022-10-26 DIAGNOSIS — K6289 Other specified diseases of anus and rectum: Secondary | ICD-10-CM | POA: Diagnosis not present

## 2022-10-26 DIAGNOSIS — R634 Abnormal weight loss: Secondary | ICD-10-CM | POA: Diagnosis not present

## 2022-10-26 DIAGNOSIS — N898 Other specified noninflammatory disorders of vagina: Secondary | ICD-10-CM | POA: Diagnosis not present

## 2022-10-27 ENCOUNTER — Other Ambulatory Visit: Payer: Self-pay

## 2022-10-27 ENCOUNTER — Other Ambulatory Visit (HOSPITAL_COMMUNITY): Payer: Self-pay | Admitting: Gastroenterology

## 2022-10-27 ENCOUNTER — Telehealth: Payer: 59 | Admitting: Family Medicine

## 2022-10-27 ENCOUNTER — Encounter: Payer: Self-pay | Admitting: Family Medicine

## 2022-10-27 VITALS — Ht 66.0 in | Wt 99.0 lb

## 2022-10-27 DIAGNOSIS — G8929 Other chronic pain: Secondary | ICD-10-CM

## 2022-10-27 DIAGNOSIS — J432 Centrilobular emphysema: Secondary | ICD-10-CM | POA: Diagnosis not present

## 2022-10-27 DIAGNOSIS — R634 Abnormal weight loss: Secondary | ICD-10-CM

## 2022-10-27 DIAGNOSIS — Z87891 Personal history of nicotine dependence: Secondary | ICD-10-CM | POA: Diagnosis not present

## 2022-10-27 DIAGNOSIS — Z8349 Family history of other endocrine, nutritional and metabolic diseases: Secondary | ICD-10-CM | POA: Diagnosis not present

## 2022-10-27 DIAGNOSIS — Z1159 Encounter for screening for other viral diseases: Secondary | ICD-10-CM

## 2022-10-27 DIAGNOSIS — F411 Generalized anxiety disorder: Secondary | ICD-10-CM | POA: Diagnosis not present

## 2022-10-27 DIAGNOSIS — R7309 Other abnormal glucose: Secondary | ICD-10-CM

## 2022-10-27 DIAGNOSIS — Z114 Encounter for screening for human immunodeficiency virus [HIV]: Secondary | ICD-10-CM

## 2022-10-27 DIAGNOSIS — R69 Illness, unspecified: Secondary | ICD-10-CM | POA: Diagnosis not present

## 2022-10-27 DIAGNOSIS — K6289 Other specified diseases of anus and rectum: Secondary | ICD-10-CM | POA: Diagnosis not present

## 2022-10-27 NOTE — Patient Instructions (Signed)
° °  Please schedule a Follow-up Appointment to: No follow-ups on file. ° °If you have any other questions or concerns, please feel free to call the office or send a message through MyChart. You may also schedule an earlier appointment if necessary. ° °Additionally, you may be receiving a survey about your experience at our office within a few days to 1 week by e-mail or mail. We value your feedback. ° °Shafer Swamy, DO °[facility] °

## 2022-10-27 NOTE — Progress Notes (Signed)
Subjective:    Patient ID: Lori Davies, female    DOB: [birth year], 52 y.o.   MRN: [id number]  Lori Davies is a 52 y.o. female presenting on 10/27/2022 for Anxiety and Depression  Virtual / Telehealth Encounter - Video Visit via MyChart The purpose of this virtual visit is to provide medical care while limiting exposure to the novel coronavirus (COVID19) for both patient and office staff.  Consent was obtained for remote visit:  Yes.   Answered questions that patient had about telehealth interaction:  Yes.   I discussed the limitations, risks, security and privacy concerns of performing an evaluation and management service by video/telephone. I also discussed with the patient that there may be a patient responsible charge related to this service. The patient expressed understanding and agreed to proceed.  Patient Location: Home Provider Location: [facility] (Office)  Participants in virtual visit: - Patient: HEYLEE TANT - CMA: Orinda Kenner, CMA - Provider: Dr Parks Ranger   HPI  Unintentional Weight Loss  Updates from specialists - She saw GYN and GI yesterday. GYN considered celiac disease asked for her to be tested. GI cannot identify any potential cause of weight loss. She asked GI about all sorts of digestive symptoms IBS, Diverticulitis, Crohn's but did not believe these were causing her symptoms.  She is endorsing some loose stool now on SNRI Duloxetine.  Last visit with me 10/16/22 virtual visit, she was started on Duloxetine '30mg'$  daily for anxiety and to reduce chronic rectal pain. No significant benefit yet, it is still early in course.  Note she has failed multiple mental health meds in past with ineffective results Failed treatments Wellbutrin, SSRI Sertraline, Lexapro, Buspar   She does not endorse using any substances. No more smoking. Former smoker quit in 2019. She does not consume alcohol, nicotine, or other substances  Nutrition she  admits to eating 3 full meals per day and snacks. She does not track calories however.  Asking about lab panel, last done here 2022. She had higher A1c in the past. Labs done recently in hospital however.  Weight loss down about 30-40 lbs in past 6+ months.  Weight trend 130s 2023 down to 99 lbs in 1 year.  Has not had LDCT Lung Screening. Interested      10/27/2022   11:15 AM 08/26/2022    5:15 PM 08/07/2022   10:06 AM  Depression screen PHQ 2/9  Decreased Interest '3 3 3  '$ Down, Depressed, Hopeless '3 3 3  '$ PHQ - 2 Score '6 6 6  '$ Altered sleeping '2 2 2  '$ Tired, decreased energy '3 3 3  '$ Change in appetite '3 3 3  '$ Feeling bad or failure about yourself  '3 3 3  '$ Trouble concentrating '3 3 3  '$ Moving slowly or fidgety/restless 0 2 2  Suicidal thoughts 0 1 2  PHQ-9 Score '20 23 24  '$ Difficult doing work/chores  Extremely dIfficult Extremely dIfficult    Social History   Tobacco Use   Smoking status: Former    Packs/day: 0.50    Years: 20.00    Total pack years: 10.00    Types: Cigarettes    Quit date: 03/21/2018    Years since quitting: 4.6   Smokeless tobacco: Never  Vaping Use   Vaping Use: Some days   Substances: Nicotine, Flavoring  Substance Use Topics   Alcohol use: No    Alcohol/week: 0.0 standard drinks of alcohol   Drug use: No  Review of Systems Per HPI unless specifically indicated above     Objective:    Ht '5\' 6"'$  (1.676 m)   Wt 99 lb (44.9 kg)   LMP  (LMP Unknown)   BMI 15.98 kg/m   Wt Readings from Last 3 Encounters:  10/27/22 99 lb (44.9 kg)  10/16/22 99 lb (44.9 kg)  09/28/22 99 lb 3.3 oz (45 kg)    Physical Exam  Note examination was completely remotely via video observation objective data only  Gen - well-appearing, no acute distress or apparent pain, comfortable HEENT - eyes appear clear without discharge or redness Heart/Lungs - cannot examine virtually - observed no evidence of coughing or labored breathing. Abd - cannot examine virtually   Skin - face visible today- no rash Neuro - awake, alert, oriented Psych - not anxious appearing  I have personally reviewed the radiology report from 09/08/22 on CT Abd Pelvis.  CLINICAL DATA:  Left lower quadrant abdominal pain with weight loss x2 months. Severe rectal pressure. Decreased appetite.   EXAM: CT ABDOMEN AND PELVIS WITH CONTRAST   TECHNIQUE: Multidetector CT imaging of the abdomen and pelvis was performed using the standard protocol following bolus administration of intravenous contrast.   RADIATION DOSE REDUCTION: This exam was performed according to the departmental dose-optimization program which includes automated exposure control, adjustment of the mA and/or kV according to patient size and/or use of iterative reconstruction technique.   CONTRAST:  170m OMNIPAQUE IOHEXOL 300 MG/ML  SOLN   COMPARISON:  CT chest August 27, 2015.   FINDINGS: Lower chest: Linear bands of atelectasis versus scarring in the bilateral lung bases.   Hepatobiliary: Hypodense 2.9 cm lesion in the right lobe of the liver on image 9/2 which demonstrates a peripheral nodular focus of enhancement with some filling in of the lesion with contrast on delayed imaging, present dating back to at least September 06, 2015 chest CT when it measured 2.7 cm compatible with a benign hepatic hemangioma. Additional lesion in the anterior right lobe of the liver measuring 7 mm on image 8/2 was also subtly evident on in retrospect dating back to CT August 27, 2015 likely reflecting a benign hepatic hemangioma. Gallbladder is unremarkable. No biliary ductal dilation.   Pancreas: No pancreatic ductal dilation or evidence of acute inflammation.   Spleen: No splenomegaly or focal splenic lesion.   Adrenals/Urinary Tract: Bilateral adrenal glands appear normal. No hydronephrosis. Nonobstructive 8 mm left lower pole renal stone. Kidneys demonstrate symmetric enhancement and excretion of  contrast material. Urinary bladder is nondistended limiting evaluation.   Stomach/Bowel: No radiopaque enteric contrast material was administered. Stomach is minimally distended limiting evaluation. No pathologic dilation of small or large bowel. Normal appendix. There are redundant loops of sigmoid and transverse colon. Colon is predominately decompressed limiting evaluation. Possible wall thickening through the anal canal with hyperdense serpiginous bands extending through the anal canal for instance on image 15/5 possibly reflecting hemorrhoidal vessels.   Vascular/Lymphatic: Normal caliber abdominal aorta. Early filling of a prominent left gonadal vein and left pelvic collateral vessels.   Reproductive: Lobular uterine contour commonly reflects leiomyomas.   Other: Trace pelvic free fluid is within physiologic normal limits.   Musculoskeletal: No aggressive lytic or blastic lesion of bone. No acute osseous abnormality.   IMPRESSION: 1. Possible wall thickening through the anal canal with hyperdense serpiginous bands extending through the anal canal, possibly reflecting hemorrhoidal vessels. Suggest further evaluation with direct visualization. 2. Early filling of a prominent left gonadal vein and  left pelvic collateral vessels, which can be seen in the setting of pelvic venous insufficiency. 3. Nonobstructive 8 mm left lower pole renal stone. 4. Lobular uterine contour commonly reflects leiomyomas. Consider further evaluation with nonemergent pelvic ultrasound. 5. Stable hypodense 2.9 cm lesion in the right lobe of the liver which demonstrates a peripheral nodular focus of enhancement with some filling in of the lesion with contrast on delayed imaging, present dating back to at least September 06, 2015 chest CT, compatible with a benign hepatic hemangioma. Additional 7 mm lesion in the anterior right lobe of the liver was also subtly evident on in retrospect dating back to  CT August 27, 2015 likely reflecting a benign hepatic hemangioma.     Electronically Signed   By: Dahlia Bailiff M.D.   On: 09/08/2022 18:07  Results for orders placed or performed during the hospital encounter of 27/25/36  Basic metabolic panel  Result Value Ref Range   Sodium 138 135 - 145 mmol/L   Potassium 3.4 (L) 3.5 - 5.1 mmol/L   Chloride 108 98 - 111 mmol/L   CO2 21 (L) 22 - 32 mmol/L   Glucose, Bld 100 (H) 70 - 99 mg/dL   BUN 10 6 - 20 mg/dL   Creatinine, Ser 0.78 0.44 - 1.00 mg/dL   Calcium 9.6 8.9 - 10.3 mg/dL   GFR, Estimated >60 >60 mL/min   Anion gap 9 5 - 15  CBC  Result Value Ref Range   WBC 5.6 4.0 - 10.5 K/uL   RBC 4.62 3.87 - 5.11 MIL/uL   Hemoglobin 13.4 12.0 - 15.0 g/dL   HCT 42.0 36.0 - 46.0 %   MCV 90.9 80.0 - 100.0 fL   MCH 29.0 26.0 - 34.0 pg   MCHC 31.9 30.0 - 36.0 g/dL   RDW 13.3 11.5 - 15.5 %   Platelets 334 150 - 400 K/uL   nRBC 0.0 0.0 - 0.2 %  Troponin I (High Sensitivity)  Result Value Ref Range   Troponin I (High Sensitivity) <2 <18 ng/L  Troponin I (High Sensitivity)  Result Value Ref Range   Troponin I (High Sensitivity) 3 <18 ng/L      Assessment & Plan:   Problem List Items Addressed This Visit     Centrilobular emphysema (HCC)   Relevant Orders   COMPLETE METABOLIC PANEL WITH GFR   Ambulatory Referral Lung Cancer Screening [facility]   Elevated hemoglobin A1c   Relevant Orders   Hemoglobin A1c   GAD (generalized anxiety disorder)   Relevant Orders   COMPLETE METABOLIC PANEL WITH GFR   Other Visit Diagnoses     Unintentional weight loss    -  Primary   Relevant Orders   COMPLETE METABOLIC PANEL WITH GFR   Hemoglobin A1c   Hepatitis C antibody   HIV Antibody (routine testing w rflx)   TSH   T4, free   Celiac Disease Panel   Sed Rate (ESR)   C-reactive protein   Amb ref to Medical Nutrition Therapy-MNT   Family history of thyroid disease       Relevant Orders   TSH   T4, free   Rectal pain, chronic        Need for hepatitis C screening test       Relevant Orders   Hepatitis C antibody   Screening for HIV (human immunodeficiency virus)       Relevant Orders   HIV Antibody (routine testing w rflx)   Former smoker  Relevant Orders   Ambulatory Referral Lung Cancer Screening [facility]       #Unintentional Weight Loss No clear etiology at this time. Seems to maintain her nutritional intake, but not tracking calories History of former smoker, not actively smoking. No other external etiology identified Labs previously 08/2021 show low albumin Thought to have some component w chronic pain and mood/anxiety, also rectal pain  Already had work up from [facility], Colorectal Surgery, GI and GYN. GI has no other identified potential source of unintentional weight loss.  She has had extensive abdominal imaging already in past.  Expand lab panel today for more potential sources of unintentional weight loss, see orders.  Less likely celiac, based on history. Will check lab.  Prior A1c 6.3 in 2022 will check again, now  Check Thyroid panel  Checking inflammatory markers.  Referral to LDCT for lung screening. Based on smoking history.  Referral to Nutritionist for treating weight loss  Regarding Anxiety / Chronic Rectal pain  It has only been 1.5 weeks on Duloxetine, I advised wait full 3-4 weeks to see if any notable benefit, then next move is double dose to '60mg'$  daily, if tolerated. Some GI intolerance now but not reported as severe, seems to just be loose stool. But not persistent diarrhea.  Already on BDZ for anxiety. May continue this if helping rectal spasm and symptoms w anxiety acutely.  Future reconsider mental health options if inadequately controlled.  Orders Placed This Encounter  Procedures   COMPLETE METABOLIC PANEL WITH GFR    Standing Status:   Future    Standing Expiration Date:   01/20/2023   Hemoglobin A1c    Standing Status:   Future    Standing  Expiration Date:   01/20/2023   Hepatitis C antibody    Standing Status:   Future    Standing Expiration Date:   01/20/2023   HIV Antibody (routine testing w rflx)    Standing Status:   Future    Standing Expiration Date:   01/20/2023   TSH    Standing Status:   Future    Standing Expiration Date:   01/20/2023   T4, free    Standing Status:   Future    Standing Expiration Date:   01/20/2023   Celiac Disease Panel    Standing Status:   Future    Standing Expiration Date:   01/20/2023   Sed Rate (ESR)    Standing Status:   Future    Standing Expiration Date:   01/20/2023   C-reactive protein    Standing Status:   Future    Standing Expiration Date:   01/20/2023   Ambulatory Referral Lung Cancer Screening  Pulmonary    Referral Priority:   Routine    Referral Type:   Consultation    Referral Reason:   Specialty Services Required    Number of Visits Requested:   1   Amb ref to Medical Nutrition Therapy-MNT    Referral Priority:   Routine    Referral Type:   Consultation    Referral Reason:   Specialty Services Required    Requested Specialty:   Nutrition    Number of Visits Requested:   1     No orders of the defined types were placed in this encounter.    Follow up plan: Return if symptoms worsen or fail to improve.  Future labs ordered for tomorrow 230pm 10/28/22 CMET CBC HIV Hep C A1c TSH T4 Celiac ESR CRP  Patient verbalizes understanding with the above medical recommendations including the limitation of remote medical advice.  Specific follow-up and call-back criteria were given for patient to follow-up or seek medical care more urgently if needed.  Total duration of direct patient care provided via video conference: 20 minutes   [name], [facility] 10/27/2022, 11:20 AM

## 2022-10-28 ENCOUNTER — Other Ambulatory Visit: Payer: 59

## 2022-10-28 ENCOUNTER — Ambulatory Visit: Payer: 59 | Admitting: Physical Therapy

## 2022-10-28 DIAGNOSIS — R7309 Other abnormal glucose: Secondary | ICD-10-CM | POA: Diagnosis not present

## 2022-10-28 DIAGNOSIS — Z8349 Family history of other endocrine, nutritional and metabolic diseases: Secondary | ICD-10-CM | POA: Diagnosis not present

## 2022-10-28 DIAGNOSIS — R634 Abnormal weight loss: Secondary | ICD-10-CM | POA: Diagnosis not present

## 2022-10-28 DIAGNOSIS — R69 Illness, unspecified: Secondary | ICD-10-CM | POA: Diagnosis not present

## 2022-10-28 DIAGNOSIS — J432 Centrilobular emphysema: Secondary | ICD-10-CM | POA: Diagnosis not present

## 2022-10-28 DIAGNOSIS — Z1159 Encounter for screening for other viral diseases: Secondary | ICD-10-CM | POA: Diagnosis not present

## 2022-10-28 DIAGNOSIS — Z114 Encounter for screening for human immunodeficiency virus [HIV]: Secondary | ICD-10-CM | POA: Diagnosis not present

## 2022-10-29 ENCOUNTER — Encounter (HOSPITAL_COMMUNITY): Payer: Self-pay

## 2022-10-29 ENCOUNTER — Ambulatory Visit (HOSPITAL_COMMUNITY): Admission: RE | Admit: 2022-10-29 | Payer: 59 | Source: Ambulatory Visit

## 2022-10-29 LAB — COMPLETE METABOLIC PANEL WITH GFR
AG Ratio: 1.9 (calc) (ref 1.0–2.5)
ALT: 23 U/L (ref 6–29)
AST: 20 U/L (ref 10–35)
Albumin: 4.8 g/dL (ref 3.6–5.1)
Alkaline phosphatase (APISO): 69 U/L (ref 37–153)
BUN: 11 mg/dL (ref 7–25)
CO2: 25 mmol/L (ref 20–32)
Calcium: 10.6 mg/dL — ABNORMAL HIGH (ref 8.6–10.4)
Chloride: 105 mmol/L (ref 98–110)
Creat: 0.88 mg/dL (ref 0.50–1.03)
Globulin: 2.5 g/dL (calc) (ref 1.9–3.7)
Glucose, Bld: 88 mg/dL (ref 65–99)
Potassium: 4.3 mmol/L (ref 3.5–5.3)
Sodium: 142 mmol/L (ref 135–146)
Total Bilirubin: 0.4 mg/dL (ref 0.2–1.2)
Total Protein: 7.3 g/dL (ref 6.1–8.1)
eGFR: 79 mL/min/{1.73_m2} (ref >=60)

## 2022-10-29 LAB — HIV ANTIBODY (ROUTINE TESTING W REFLEX): HIV 1&2 Ab, 4th Generation: NONREACTIVE

## 2022-10-29 LAB — HEMOGLOBIN A1C
Hgb A1c MFr Bld: 5.8 % of total Hgb — ABNORMAL HIGH (ref <5.7)
Mean Plasma Glucose: 120 mg/dL
eAG (mmol/L): 6.6 mmol/L

## 2022-10-29 LAB — TSH: TSH: 1.59 mIU/L

## 2022-10-29 LAB — CELIAC DISEASE PANEL
(tTG) Ab, IgA: <1 U/mL
(tTG) Ab, IgG: <1 U/mL
Gliadin IgA: <1 U/mL
Gliadin IgG: <1 U/mL
Immunoglobulin A: 252 mg/dL (ref 47–310)

## 2022-10-29 LAB — SEDIMENTATION RATE: Sed Rate: 2 mm/h (ref 0–30)

## 2022-10-29 LAB — T4, FREE: Free T4: 1.2 ng/dL (ref 0.8–1.8)

## 2022-10-29 LAB — C-REACTIVE PROTEIN: CRP: <0.2 mg/L (ref <8.0)

## 2022-10-29 LAB — HEPATITIS C ANTIBODY: Hepatitis C Ab: NONREACTIVE

## 2022-11-04 ENCOUNTER — Ambulatory Visit: Payer: 59 | Admitting: Physical Therapy

## 2022-11-05 DIAGNOSIS — R933 Abnormal findings on diagnostic imaging of other parts of digestive tract: Secondary | ICD-10-CM | POA: Diagnosis not present

## 2022-11-05 DIAGNOSIS — K529 Noninfective gastroenteritis and colitis, unspecified: Secondary | ICD-10-CM | POA: Diagnosis not present

## 2022-11-05 DIAGNOSIS — K6389 Other specified diseases of intestine: Secondary | ICD-10-CM | POA: Diagnosis not present

## 2022-11-05 DIAGNOSIS — K6289 Other specified diseases of anus and rectum: Secondary | ICD-10-CM | POA: Diagnosis not present

## 2022-11-06 ENCOUNTER — Encounter: Payer: Self-pay | Admitting: Family Medicine

## 2022-11-08 ENCOUNTER — Other Ambulatory Visit: Payer: Self-pay | Admitting: Family Medicine

## 2022-11-08 DIAGNOSIS — F410 Panic disorder [episodic paroxysmal anxiety] without agoraphobia: Secondary | ICD-10-CM

## 2022-11-09 NOTE — Telephone Encounter (Signed)
Requested medication (s) are due for refill today- no  Requested medication (s) are on the active medication list -yes  Future visit scheduled -no  Last refill: 10/16/22 #30 2RF  Notes to clinic: request for 90 day supply- request requires changes to original Rx- sent for review - Rx does not have 90 day left to fill  Requested Prescriptions  Pending Prescriptions Disp Refills   DULoxetine (CYMBALTA) 30 MG capsule [Pharmacy Med Name: DULOXETINE HCL DR 30 MG CAP] 90 capsule 1    Sig: TAKE 1 [place]     Psychiatry: Antidepressants - SNRI - duloxetine Passed - 11/08/2022 11:32 AM      Passed - Cr in normal range and within 360 days    Creat  Date Value Ref Range Status  10/28/2022 0.88 0.50 - 1.03 mg/dL Final         Passed - eGFR is 30 or above and within 360 days    GFR, Est African American  Date Value Ref Range Status  12/26/2019 111 > OR = 60 mL/min/1.68m Final   GFR, Est Non African American  Date Value Ref Range Status  12/26/2019 96 > OR = 60 mL/min/1.727mFinal   GFR, Estimated  Date Value Ref Range Status  09/28/2022 >60 >60 mL/min Final    Comment:    (NOTE) Calculated using the CKD-EPI Creatinine Equation (2021)    eGFR  Date Value Ref Range Status  10/28/2022 79 > OR = 60 mL/min/1.739minal         Passed - Completed PHQ-2 or PHQ-9 in the last 360 days      Passed - Last BP in normal range    BP Readings from Last 1 Encounters:  09/28/22 99/69         Passed - Valid encounter within last 6 months    Recent Outpatient Visits           1 week ago Unintentional weight loss   [facility]   3 weeks ago Rectal pain, chronic   [facility]   2 months ago Generalized anxiety disorder with panic attacks   [facility]   3 months ago Generalized anxiety disorder with  panic attacks   [facility]   6 months ago Acute non-recurrent frontal sinusitis   [facility]                 Requested Prescriptions  Pending Prescriptions Disp Refills   DULoxetine (CYMBALTA) 30 MG capsule [Pharmacy Med Name: DULOXETINE HCL DR 30 MG CAP] 90 capsule 1    Sig: TAKE 1 CAPSULE BY MOUTH EVERY DAY     Psychiatry: Antidepressants - SNRI - duloxetine Passed - 11/08/2022 11:32 AM      Passed - Cr in normal range and within 360 days    Creat  Date Value Ref Range Status  10/28/2022 0.88 0.50 - 1.03 mg/dL Final         Passed - eGFR is 30 or above and within 360 days    GFR, Est African American  Date Value Ref Range Status  12/26/2019 111 > OR = 60 mL/min/1.75m23mnal   GFR, Est Non African American  Date Value Ref Range Status  12/26/2019 96 > OR =  60 mL/min/1.22m Final   GFR, Estimated  Date Value Ref Range Status  09/28/2022 >60 >60 mL/min Final    Comment:    (NOTE) Calculated using the CKD-EPI Creatinine Equation (2021)    eGFR  Date Value Ref Range Status  10/28/2022 79 > OR = 60 mL/min/1.7101mFinal         Passed - Completed PHQ-2 or PHQ-9 in the last 360 days      Passed - Last BP in normal range    BP Readings from Last 1 Encounters:  09/28/22 99/69         Passed - Valid encounter within last 6 months    Recent Outpatient Visits           1 week ago Unintentional weight loss   [facility]   3 weeks ago Rectal pain, chronic   [facility]   2 months ago Generalized anxiety disorder with panic attacks   [facility]   3 months ago Generalized anxiety disorder with panic attacks   [facility]   6  months ago Acute non-recurrent frontal sinusitis   [facility]

## 2022-11-10 ENCOUNTER — Encounter: Payer: Self-pay | Admitting: Family Medicine

## 2022-11-10 DIAGNOSIS — G8929 Other chronic pain: Secondary | ICD-10-CM

## 2022-11-11 ENCOUNTER — Institutional Professional Consult (permissible substitution): Payer: 59 | Admitting: Pulmonary Disease

## 2022-11-11 ENCOUNTER — Ambulatory Visit: Payer: 59 | Admitting: Physical Therapy

## 2022-11-13 DIAGNOSIS — D259 Leiomyoma of uterus, unspecified: Secondary | ICD-10-CM | POA: Insufficient documentation

## 2022-11-16 ENCOUNTER — Ambulatory Visit: Payer: 59 | Admitting: Dietician

## 2022-11-17 ENCOUNTER — Institutional Professional Consult (permissible substitution): Payer: 59 | Admitting: Student in an Organized Health Care Education/Training Program

## 2022-11-24 ENCOUNTER — Ambulatory Visit: Payer: 59 | Admitting: Gastroenterology

## 2022-11-24 ENCOUNTER — Other Ambulatory Visit: Payer: Self-pay

## 2022-11-24 DIAGNOSIS — K600 Acute anal fissure: Secondary | ICD-10-CM | POA: Insufficient documentation

## 2022-11-24 NOTE — Progress Notes (Deleted)
Jonathon Bellows MD, MRCP(U.K) [address]  [place]  [place], [place] [address]  Main: [phone]  Fax: [phone]   Gastroenterology Consultation  Referring Provider:     [name] * Primary Care Physician:  Olin Hauser, DO Primary Gastroenterologist:  Dr. Jonathon Bellows  Reason for Consultation:    Hemorrhoids         HPI:   Lori Davies is a 52 y.o. y/o female referred for consultation & management  by Dr. Parks Ranger, Devonne Doughty, DO.   She has a been referred for issues with hemorrhoids.  She has a history of chronic anal pain which has been seen by the surgeons in the past.  Treated with diltiazem topical.  She had difficulty even having a digital rectal exam and anoscopy to be performed at the bedside due to pain and had seen Dr. Ferrel Logan back in December 2023 and was taken to the OR for evaluation.  She has had Botox injections.  Previously declined lateral splinter ectomy.  Previous plan was to refer her to colorectal surgery.  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  Subsequently has seen colorectal surgery at [facility] and declined any further evaluation by them.  Has been referred to [facility] pain management as well. Past Medical History:  Diagnosis Date   Anxiety    Chronic anal fissure    COPD, mild ([place]) 2016   Ectopic pregnancy 2016   History of kidney stones    Internal and external thrombosed hemorrhoids    Pre-diabetes     Past Surgical History:  Procedure Laterality Date   ADENOIDECTOMY     BOTOX INJECTION N/A 08/12/2022   Procedure: BOTOX INJECTION;  Surgeon: Herbert Pun, MD;  Location: [facility] ORS;  Service: General;  Laterality: N/A;    Prior to Admission medications   Medication Sig Start Date End Date Taking? Authorizing Provider  conjugated estrogens (PREMARIN) vaginal cream Place 1 applicator vaginally daily. 10/26/22   [provider]  diazepam (VALIUM) 5 MG tablet Take 1-2 tablets (5-10  mg total) by mouth every 12 (twelve) hours as needed for anxiety (muscle spasm). 10/16/22   Karamalegos, Devonne Doughty, DO  DULoxetine (CYMBALTA) 30 MG capsule TAKE 1 CAPSULE BY MOUTH EVERY DAY 11/09/22   Karamalegos, Devonne Doughty, DO  gabapentin (NEURONTIN) 100 MG capsule Take 100 mg by mouth 3 (three) times daily. 11/06/22   [provider]  montelukast (SINGULAIR) 10 MG tablet Take 1 tablet (10 mg total) by mouth at bedtime. 03/31/21   Karamalegos, Devonne Doughty, DO  nifedipine 0.3 % ointment Place 1 Application rectally daily. 09/30/22   [provider]  nitroGLYCERIN (NITROGLYN) 2 % OINT ointment Apply 1 Application topically as needed (with 0.2% lidocaine to rectum).    [provider]  sertraline (ZOLOFT) 25 MG tablet Take 25 mg by mouth daily. 11/06/22   [provider]  UNABLE TO FIND Place 1 application  rectally 4 (four) times daily as needed. Med Name: diltiazem cream    [provider]    Family History  Problem Relation Age of Onset   Heart disease Mother    Lung disease Mother    Heart disease Father    Hypertension Father    Breast cancer Maternal Aunt    Diabetes Maternal Aunt    Autism Son    Colon cancer Neg Hx      Social History   Tobacco Use   Smoking status: Former  Packs/day: 0.50    Years: 20.00    Total pack years: 10.00    Types: Cigarettes    Quit date: 03/21/2018    Years since quitting: 4.6   Smokeless tobacco: Never  Vaping Use   Vaping Use: Some days   Substances: Nicotine, Flavoring  Substance Use Topics   Alcohol use: No    Alcohol/week: 0.0 standard drinks of alcohol   Drug use: No    Allergies as of 11/24/2022 - Review Complete 10/27/2022  Allergen Reaction Noted   Clavulanic acid Nausea And Vomiting 11/03/2017   Fish oil Hives 03/12/2015   Sulfa antibiotics Nausea Only 03/16/2018    Review of Systems:    All systems reviewed and negative except where noted in HPI.   Physical Exam:  LMP  (LMP  Unknown)  No LMP recorded (lmp unknown). Patient is postmenopausal. Psych:  Alert and cooperative. Normal mood and affect. General:   Alert,  Well-developed, well-nourished, pleasant and cooperative in NAD Head:  Normocephalic and atraumatic. Eyes:  Sclera clear, no icterus.   Conjunctiva pink. Ears:  Normal auditory acuity. Neck:  Supple; no masses or thyromegaly. Lungs:  Respirations even and unlabored.  Clear throughout to auscultation.   No wheezes, crackles, or rhonchi. No acute distress. Heart:  Regular rate and rhythm; no murmurs, clicks, rubs, or gallops. Abdomen:  Normal bowel sounds.  No bruits.  Soft, non-tender and non-distended without masses, hepatosplenomegaly or hernias noted.  No guarding or rebound tenderness.    Neurologic:  Alert and oriented x3;  grossly normal neurologically. Psych:  Alert and cooperative. Normal mood and affect.  Imaging Studies: No results found.  Assessment and Plan:   Lori Davies is a 52 y.o. y/o female has been referred for ***  Follow up in ***  Dr Jonathon Bellows MD,MRCP(U.K)    BP check *** \

## 2022-11-29 ENCOUNTER — Other Ambulatory Visit: Payer: Self-pay | Admitting: Family Medicine

## 2022-11-29 DIAGNOSIS — F411 Generalized anxiety disorder: Secondary | ICD-10-CM

## 2022-11-30 ENCOUNTER — Encounter: Payer: Self-pay | Admitting: Family Medicine

## 2022-11-30 NOTE — Telephone Encounter (Signed)
She responded w/ Sertraline  [name], DO [facility] 11/30/2022, 5:49 PM

## 2022-11-30 NOTE — Telephone Encounter (Signed)
Requested medication (s) are due for refill today: yes  Requested medication (s) are on the active medication list: yes  Last refill:  11/24/22  Future visit scheduled: no  Notes to clinic:  Unable to refill per protocol, last refill by another provider. Historical provider, routing for approval.     Requested Prescriptions  Pending Prescriptions Disp Refills   sertraline (ZOLOFT) 25 MG tablet [Pharmacy Med Name: SERTRALINE HCL 25 MG TABLET] 90 tablet 1    Sig: TAKE 1 TABLET BY MOUTH EVERY DAY     Psychiatry:  Antidepressants - SSRI - sertraline Passed - 11/29/2022 11:30 AM      Passed - AST in normal range and within 360 days    AST  Date Value Ref Range Status  10/28/2022 20 10 - 35 U/L Final         Passed - ALT in normal range and within 360 days    ALT  Date Value Ref Range Status  10/28/2022 23 6 - 29 U/L Final         Passed - Completed PHQ-2 or PHQ-9 in the last 360 days      Passed - Valid encounter within last 6 months    Recent Outpatient Visits           1 month ago Unintentional weight loss   [place], DO   1 month ago Rectal pain, chronic   [place], DO   3 months ago Generalized anxiety disorder with panic attacks   [place], DO   3 months ago Generalized anxiety disorder with panic attacks   [place], DO   7 months ago Acute non-recurrent frontal sinusitis   [place], [place]

## 2022-11-30 NOTE — Telephone Encounter (Signed)
Sent patient mychart message to clarify if she is on Sertraline 25 or Duloxetine [place], DO [facility] 11/30/2022, 5:46 PM

## 2022-12-02 DIAGNOSIS — D259 Leiomyoma of uterus, unspecified: Secondary | ICD-10-CM | POA: Diagnosis not present

## 2022-12-07 ENCOUNTER — Ambulatory Visit: Payer: 59 | Admitting: Psychiatry

## 2022-12-09 ENCOUNTER — Encounter: Payer: Self-pay | Admitting: Family Medicine

## 2022-12-09 DIAGNOSIS — F410 Panic disorder [episodic paroxysmal anxiety] without agoraphobia: Secondary | ICD-10-CM

## 2022-12-09 DIAGNOSIS — K594 Anal spasm: Secondary | ICD-10-CM

## 2022-12-09 MED ORDER — DIAZEPAM 5 MG PO TABS: 5.0000 mg | ORAL_TABLET | Freq: Two times a day (BID) | ORAL | 0 refills | Status: DC | PRN

## 2022-12-16 DIAGNOSIS — M6289 Other specified disorders of muscle: Secondary | ICD-10-CM | POA: Diagnosis not present

## 2022-12-16 DIAGNOSIS — K6289 Other specified diseases of anus and rectum: Secondary | ICD-10-CM | POA: Diagnosis not present

## 2023-01-05 ENCOUNTER — Encounter: Payer: Self-pay | Admitting: Physical Therapy

## 2023-01-05 ENCOUNTER — Ambulatory Visit: Payer: 59 | Attending: General Surgery | Admitting: Physical Therapy

## 2023-01-05 DIAGNOSIS — K6289 Other specified diseases of anus and rectum: Secondary | ICD-10-CM | POA: Diagnosis not present

## 2023-01-05 DIAGNOSIS — M533 Sacrococcygeal disorders, not elsewhere classified: Secondary | ICD-10-CM

## 2023-01-05 DIAGNOSIS — G8929 Other chronic pain: Secondary | ICD-10-CM | POA: Diagnosis not present

## 2023-01-05 DIAGNOSIS — R2689 Other abnormalities of gait and mobility: Secondary | ICD-10-CM | POA: Diagnosis not present

## 2023-01-05 DIAGNOSIS — R278 Other lack of coordination: Secondary | ICD-10-CM | POA: Insufficient documentation

## 2023-01-05 DIAGNOSIS — M6281 Muscle weakness (generalized): Secondary | ICD-10-CM | POA: Diagnosis not present
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
## 2023-01-06 NOTE — Addendum Note (Signed)
Addended by: Mariane Masters on: 01/06/2023 05:14 PM   Modules accepted: Orders

## 2023-01-11 ENCOUNTER — Other Ambulatory Visit: Payer: Self-pay | Admitting: Family Medicine

## 2023-01-11 ENCOUNTER — Encounter: Payer: Self-pay | Admitting: Family Medicine

## 2023-01-11 DIAGNOSIS — F410 Panic disorder [episodic paroxysmal anxiety] without agoraphobia: Secondary | ICD-10-CM

## 2023-01-11 DIAGNOSIS — K594 Anal spasm: Secondary | ICD-10-CM

## 2023-01-11 MED ORDER — DIAZEPAM 5 MG PO TABS: 5.0000 mg | ORAL_TABLET | Freq: Two times a day (BID) | ORAL | 0 refills | Status: DC | PRN

## 2023-01-12 ENCOUNTER — Ambulatory Visit: Payer: 59 | Admitting: Physical Therapy

## 2023-01-12 DIAGNOSIS — R278 Other lack of coordination: Secondary | ICD-10-CM

## 2023-01-12 DIAGNOSIS — M533 Sacrococcygeal disorders, not elsewhere classified: Secondary | ICD-10-CM | POA: Diagnosis not present

## 2023-01-12 DIAGNOSIS — R2689 Other abnormalities of gait and mobility: Secondary | ICD-10-CM | POA: Diagnosis not present

## 2023-01-12 DIAGNOSIS — M6281 Muscle weakness (generalized): Secondary | ICD-10-CM | POA: Diagnosis not present

## 2023-01-12 DIAGNOSIS — G8929 Other chronic pain: Secondary | ICD-10-CM | POA: Diagnosis not present

## 2023-01-12 DIAGNOSIS — K6289 Other specified diseases of anus and rectum: Secondary | ICD-10-CM | POA: Diagnosis not present
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
## 2023-01-12 NOTE — Patient Instructions (Addendum)
Yellow band:  _________  Place band in "U"    band under ballmounds  while laying on back w/ knees bent   Lying on back, knees bent    band under ballmounds  while laying on back w/ knees bent  "W" exercise   30 reps   Band is placed under feet, knees bent, feet are hip width apart Hold band with thumbs point out, keep upper arm and elbow touching the bed the whole time  - inhale and then exhale pull bands by bending elbows hands move in a "w"  (feel shoulder blades squeezing)   ________________   Oblique/ scapula stabilization   Opposite arm   Place band in "U"    band under ballmounds  while laying on back w/ knees bent     30 reps  on each side  Holding band from opposite thigh,  Inhale,    exhale then pull band across body while keeping elbow , shoulders, back of the head pressed down    ______________   Avoid straining pelvic floor, abdominal muscles , spine  Use log rolling technique instead of getting out of bed with your neck or the sit-up     Log rolling into and out of bed   Log rolling into and out of bed If getting out of bed on R side, Bent knees, scoot hips/ shoulder to L  Raise R arm completely overhead, rolling onto armpit  Then lower bent knees to bed to get into complete side lying position  Then drop legs off bed, and push up onto R elbow/forearm, and use L hand to push onto the bed    Dig elbows and feet to lift hte buttocks and scoot without lifting head

## 2023-01-13 ENCOUNTER — Telehealth: Payer: 59 | Admitting: Family Medicine

## 2023-01-13 ENCOUNTER — Encounter: Payer: Self-pay | Admitting: Family Medicine

## 2023-01-13 DIAGNOSIS — F411 Generalized anxiety disorder: Secondary | ICD-10-CM

## 2023-01-13 DIAGNOSIS — K594 Anal spasm: Secondary | ICD-10-CM | POA: Diagnosis not present

## 2023-01-13 DIAGNOSIS — F410 Panic disorder [episodic paroxysmal anxiety] without agoraphobia: Secondary | ICD-10-CM | POA: Diagnosis not present

## 2023-01-13 MED ORDER — DIAZEPAM 10 MG PO TABS: 10.0000 mg | ORAL_TABLET | Freq: Two times a day (BID) | ORAL | 2 refills | Status: DC

## 2023-01-13 NOTE — Progress Notes (Addendum)
Subjective:    Patient ID: Lori Davies, female    DOB: August 27, 1971, 52 y.o.   MRN: [id number]  Lori Davies is a 52 y.o. female presenting on 01/13/2023 for Anxiety  Virtual / Telehealth Encounter - Video Visit via MyChart The purpose of this virtual visit is to provide medical care while limiting exposure to the novel coronavirus (COVID19) for both patient and office staff.  Consent was obtained for remote visit:  Yes.   Answered questions that patient had about telehealth interaction:  Yes.   I discussed the limitations, risks, security and privacy concerns of performing an evaluation and management service by video/telephone. I also discussed with the patient that there may be a patient responsible charge related to this service. The patient expressed understanding and agreed to proceed.  Patient Location: Home Provider Location: [facility] (Office)  Participants in virtual visit: - Patient: Lori Davies - CMA: Darrol Angel, CMA - Provider: Dr Althea Charon   HPI  Rectal Spasm / Rectal Pain Pelvic Floor Dysfunction Anxiety  Reviewed prior history and course  Taking Diazepam 5-10mg  per day with good results of muscle relaxation and it can help reduce her pain and control her pain and manage daily function.  Gi has requested that she continue the med and request that her PCP can manage it  Tried most medications possible with various muscle relaxants, gabapentin. Without success off of these meds  UroGYN on 01/25/23 Dr Carlyon Shadow upcoming apt.  Restarted Pelvic Floor Therapy again.  Diazepam helps manage her anxiety as well. She feels well with med and it helps her function with reduced pain and anxiety.  She recently picked up rx, she will take 5-10mg  twice a day. Will need updated rx in future, for accommodate dosing 10mg .     01/13/2023   11:15 AM 10/27/2022   11:15 AM 08/26/2022    5:15 PM  Depression screen PHQ 2/9  Decreased Interest 2 3 3    Down, Depressed, Hopeless 2 3 3   PHQ - 2 Score 4 6 6   Altered sleeping 0 2 2  Tired, decreased energy 1 3 3   Change in appetite 2 3 3   Feeling bad or failure about yourself  1 3 3   Trouble concentrating 0 3 3  Moving slowly or fidgety/restless 0 0 2  Suicidal thoughts 0 0 1  PHQ-9 Score 8 20 23   Difficult doing work/chores   Extremely dIfficult    Social History   Tobacco Use   Smoking status: Former    Packs/day: 0.50    Years: 20.00    Additional pack years: 0.00    Total pack years: 10.00    Types: Cigarettes    Quit date: 03/21/2018    Years since quitting: 4.8   Smokeless tobacco: Never  Vaping Use   Vaping Use: Some days   Substances: Nicotine, Flavoring  Substance Use Topics   Alcohol use: No    Alcohol/week: 0.0 standard drinks of alcohol   Drug use: No    Review of Systems Per HPI unless specifically indicated above     Objective:    Ht 5\' 6"  (1.676 m)   Wt 99 lb (44.9 kg)   LMP  (LMP Unknown)   BMI 15.98 kg/m   Wt Readings from Last 3 Encounters:  01/13/23 99 lb (44.9 kg)  10/27/22 99 lb (44.9 kg)  10/16/22 99 lb (44.9 kg)    Physical Exam  Note examination was completely remotely via video  observation objective data only  Gen - well-appearing, no acute distress or apparent pain, comfortable HEENT - eyes appear clear without discharge or redness Heart/Lungs - cannot examine virtually - observed no evidence of coughing or labored breathing. Abd - cannot examine virtually  Skin - face visible today- no rash Neuro - awake, alert, oriented Psych - not anxious appearing   Results for orders placed or performed in visit on 10/27/22  C-reactive protein  Result Value Ref Range   CRP <0.2 <8.0 mg/L  Sed Rate (ESR)  Result Value Ref Range   Sed Rate 2 0 - 30 mm/h  Celiac Disease Panel  Result Value Ref Range   Immunoglobulin A 252 47 - 310 mg/dL   Gliadin IgA <0.9 U/mL   Gliadin IgG <1.0 U/mL   (tTG) Ab, IgG <1.0 U/mL   (tTG) Ab, IgA <1.0  U/mL  T4, free  Result Value Ref Range   Free T4 1.2 0.8 - 1.8 ng/dL  TSH  Result Value Ref Range   TSH 1.59 mIU/L  HIV Antibody (routine testing w rflx)  Result Value Ref Range   HIV 1&2 Ab, 4th Generation NON-REACTIVE NON-REACTIVE  Hepatitis C antibody  Result Value Ref Range   Hepatitis C Ab NON-REACTIVE NON-REACTIVE  Hemoglobin A1c  Result Value Ref Range   Hgb A1c MFr Bld 5.8 (H) <5.7 % of total Hgb   Mean Plasma Glucose 120 mg/dL   eAG (mmol/L) 6.6 mmol/L  COMPLETE METABOLIC PANEL WITH GFR  Result Value Ref Range   Glucose, Bld 88 65 - 99 mg/dL   BUN 11 7 - 25 mg/dL   Creat 8.11 9.14 - 7.82 mg/dL   eGFR 79 > OR = 60 NF/AOZ/3.08M5   BUN/Creatinine Ratio SEE NOTE: 6 - 22 (calc)   Sodium 142 135 - 146 mmol/L   Potassium 4.3 3.5 - 5.3 mmol/L   Chloride 105 98 - 110 mmol/L   CO2 25 20 - 32 mmol/L   Calcium 10.6 (H) 8.6 - 10.4 mg/dL   Total Protein 7.3 6.1 - 8.1 g/dL   Albumin 4.8 3.6 - 5.1 g/dL   Globulin 2.5 1.9 - 3.7 g/dL (calc)   AG Ratio 1.9 1.0 - 2.5 (calc)   Total Bilirubin 0.4 0.2 - 1.2 mg/dL   Alkaline phosphatase (APISO) 69 37 - 153 U/L   AST 20 10 - 35 U/L   ALT 23 6 - 29 U/L      Assessment & Plan:   Problem List Items Addressed This Visit   None Visit Diagnoses     Rectal spasm       Relevant Medications   diazepam (VALIUM) 10 MG tablet (Start on 01/27/2023)   Generalized anxiety disorder with panic attacks       Relevant Medications   diazepam (VALIUM) 10 MG tablet (Start on 01/27/2023)       Improved chronic rectal pain / rectal spasm Anxiety with panic attacks  Improved on Diazepam 5-10mg  TWICE A DAY dosing. Agree to manage this medication for her given she has tried failed multiple medications procedures evaluations by various specialists. It has helped manage her rectal spasm pain and anxiety She will proceed w/ UROGYN for Pelvic Floor Therapy as scheduled  New rx sent for future in 2 weeks, dose increase Diazepam 10mg  tab take every 12  hours 60 pill +2 refill. She may reduce to half tab if preferred at times.   Meds ordered this encounter  Medications   diazepam (VALIUM) 10 MG  tablet    Sig: Take 1 tablet (10 mg total) by mouth every 12 (twelve) hours.    Dispense:  60 tablet    Refill:  2    Dose adjustment, first fill 01/27/23      Follow up plan: Return if symptoms worsen or fail to improve.  Patient verbalizes understanding with the above medical recommendations including the limitation of remote medical advice.  Specific follow-up and call-back criteria were given for patient to follow-up or seek medical care more urgently if needed.  Total duration of direct patient care provided via video conference: 10 minutes    Saralyn Pilar, DO [facility] 01/13/2023, 11:42 AM

## 2023-01-13 NOTE — Patient Instructions (Addendum)
First fill 01/27/23 of new Diazepam dose  tabs, take every 12 hours, 60 pill w refills x 2, let me know if any other issues  Please schedule a Follow-up Appointment to: Return if symptoms worsen or fail to improve.  If you have any other questions or concerns, please feel free to call the office or send a message through MyChart. You may also schedule an earlier appointment if necessary.  Additionally, you may be receiving a survey about your experience at our office within a few days to 1 week by e-mail or mail. We value your feedback.  Saralyn Pilar, DO [facility] [place], [place]

## 2023-01-19 ENCOUNTER — Ambulatory Visit: Payer: 59 | Admitting: Physical Therapy

## 2023-01-19 DIAGNOSIS — R278 Other lack of coordination: Secondary | ICD-10-CM | POA: Diagnosis not present

## 2023-01-19 DIAGNOSIS — R2689 Other abnormalities of gait and mobility: Secondary | ICD-10-CM

## 2023-01-19 DIAGNOSIS — M533 Sacrococcygeal disorders, not elsewhere classified: Secondary | ICD-10-CM | POA: Diagnosis not present

## 2023-01-19 DIAGNOSIS — K6289 Other specified diseases of anus and rectum: Secondary | ICD-10-CM | POA: Diagnosis not present

## 2023-01-19 DIAGNOSIS — M6281 Muscle weakness (generalized): Secondary | ICD-10-CM | POA: Diagnosis not present

## 2023-01-19 DIAGNOSIS — G8929 Other chronic pain: Secondary | ICD-10-CM | POA: Diagnosis not present
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
## 2023-01-19 NOTE — Patient Instructions (Addendum)
  ____  Neck / shoulder stretches:    Lying on back - small sushi roll towel under neck  _ 6 directions  5 reps   _angel wings, lower elbows down , keep arms touching bed  10 reps   ___   Seated ,  inhale, breathe ribs not chest/ neck Exhale, pull yellow band apart ,thumbs out, elbow by ribs   20 reps   __   Car seat modification  Folded towels to fill bucket seat Folded beach towel long ways against back of seat into head rest to fill in the space so your head and back and sacrum aligns  Hands at 9 and 3 o clock  Feet and heel on the mat  Back of the hips against the seat evenly    __  Portable cycler, placed under chair in a corner, use the arms against the wall   

## 2023-01-25 DIAGNOSIS — N958 Other specified menopausal and perimenopausal disorders: Secondary | ICD-10-CM | POA: Diagnosis not present

## 2023-01-25 DIAGNOSIS — M6289 Other specified disorders of muscle: Secondary | ICD-10-CM | POA: Diagnosis not present

## 2023-01-26 ENCOUNTER — Ambulatory Visit: Payer: 59 | Attending: General Surgery | Admitting: Physical Therapy

## 2023-01-26 DIAGNOSIS — M533 Sacrococcygeal disorders, not elsewhere classified: Secondary | ICD-10-CM | POA: Diagnosis not present

## 2023-01-26 DIAGNOSIS — M6281 Muscle weakness (generalized): Secondary | ICD-10-CM

## 2023-01-26 DIAGNOSIS — R102 Pelvic and perineal pain: Secondary | ICD-10-CM | POA: Insufficient documentation

## 2023-01-26 DIAGNOSIS — G8929 Other chronic pain: Secondary | ICD-10-CM | POA: Diagnosis not present

## 2023-01-26 DIAGNOSIS — M799 Soft tissue disorder, unspecified: Secondary | ICD-10-CM | POA: Diagnosis not present

## 2023-01-26 DIAGNOSIS — R278 Other lack of coordination: Secondary | ICD-10-CM | POA: Insufficient documentation

## 2023-01-26 DIAGNOSIS — R2689 Other abnormalities of gait and mobility: Secondary | ICD-10-CM | POA: Diagnosis not present

## 2023-01-26 NOTE — Patient Instructions (Addendum)
Multifidis twist   Band is on doorknob: sit facing perpendicular to door , sit halfway towards front of chair, firm through 4 points of contact at feet. Feet are placed hip with apart.   Twisting trunk without moving the hips and knees Hold band at the level of ribcage, elbows bent,shoulder blades roll back and down like squeezing a pencil under armpit   Exhale twist,.10-15 deg away from door without moving your hips/ knees, press more weight on the side of the sitting bones/ foot opp of your direction of turn as your counterweight. Continue to maintain equal weight through legs.  Keep knee unlocked.  30 reps   __   Brushing arm with 3/4 turn onto pillow behind back  Lying on R  side ,Pillow/ Block between knees     dragging top forearm across ribs below breast rotating 3/4 turn,  rotating  _L_ only this week ,  relax onto the pillow behind the back  and then back to other palm , maintain top palm on body whole top and not lift shoulder 10 reps  ___   Brushing arm with 3/4 turn onto pillow behind back  Lying on L  side ,Pillow/ Block between knees     dragging top forearm across ribs below breast rotating 3/4 turn,  rotating  _R_ only this week ,  relax onto the pillow behind the back  and then back to other palm , maintain top palm on body whole top and not lift shoulder 10 reps   ___  Standing 4 points of contact In feet   Walking with higher knees to push the ground away
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
## 2023-01-27 DIAGNOSIS — R102 Pelvic and perineal pain: Secondary | ICD-10-CM | POA: Diagnosis not present

## 2023-01-27 DIAGNOSIS — M799 Soft tissue disorder, unspecified: Secondary | ICD-10-CM | POA: Diagnosis not present

## 2023-01-27 DIAGNOSIS — R634 Abnormal weight loss: Secondary | ICD-10-CM | POA: Diagnosis not present

## 2023-01-27 DIAGNOSIS — G8929 Other chronic pain: Secondary | ICD-10-CM | POA: Diagnosis not present

## 2023-01-28 ENCOUNTER — Other Ambulatory Visit: Payer: Self-pay | Admitting: Obstetrics and Gynecology

## 2023-01-28 DIAGNOSIS — F331 Major depressive disorder, recurrent, moderate: Secondary | ICD-10-CM | POA: Diagnosis not present

## 2023-01-28 DIAGNOSIS — F4322 Adjustment disorder with anxiety: Secondary | ICD-10-CM | POA: Diagnosis not present

## 2023-02-01 DIAGNOSIS — F411 Generalized anxiety disorder: Secondary | ICD-10-CM | POA: Diagnosis not present

## 2023-02-01 DIAGNOSIS — F329 Major depressive disorder, single episode, unspecified: Secondary | ICD-10-CM | POA: Diagnosis not present

## 2023-02-02 ENCOUNTER — Ambulatory Visit: Payer: 59 | Admitting: Physical Therapy
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
## 2023-02-03 ENCOUNTER — Encounter
Admission: RE | Admit: 2023-02-03 | Discharge: 2023-02-03 | Disposition: A | Discharge status: Home / Self Care | Payer: 59 | Source: Ambulatory Visit | Attending: Obstetrics and Gynecology | Admitting: Obstetrics and Gynecology

## 2023-02-03 DIAGNOSIS — Z882 Allergy status to sulfonamides status: Secondary | ICD-10-CM | POA: Diagnosis not present

## 2023-02-03 DIAGNOSIS — F325 Major depressive disorder, single episode, in full remission: Secondary | ICD-10-CM | POA: Diagnosis not present

## 2023-02-03 DIAGNOSIS — Z87891 Personal history of nicotine dependence: Secondary | ICD-10-CM | POA: Diagnosis not present

## 2023-02-03 DIAGNOSIS — Z91013 Allergy to seafood: Secondary | ICD-10-CM | POA: Diagnosis not present

## 2023-02-03 DIAGNOSIS — R636 Underweight: Secondary | ICD-10-CM | POA: Diagnosis not present

## 2023-02-03 DIAGNOSIS — Z681 Body mass index (BMI) 19 or less, adult: Secondary | ICD-10-CM | POA: Diagnosis not present

## 2023-02-03 DIAGNOSIS — F411 Generalized anxiety disorder: Secondary | ICD-10-CM | POA: Diagnosis not present

## 2023-02-03 DIAGNOSIS — J309 Allergic rhinitis, unspecified: Secondary | ICD-10-CM | POA: Diagnosis not present

## 2023-02-03 DIAGNOSIS — J449 Chronic obstructive pulmonary disease, unspecified: Secondary | ICD-10-CM | POA: Diagnosis not present

## 2023-02-03 DIAGNOSIS — M62838 Other muscle spasm: Secondary | ICD-10-CM | POA: Diagnosis not present

## 2023-02-03 HISTORY — DX: Abnormal weight loss: R63.4

## 2023-02-03 HISTORY — DX: Unspecified hemorrhoids: K64.9

## 2023-02-03 HISTORY — DX: Anemia, unspecified: D64.9

## 2023-02-03 HISTORY — DX: Other specified symptoms and signs involving the circulatory and respiratory systems: R09.89

## 2023-02-03 HISTORY — DX: Other specified abnormal immunological findings in serum: R76.8

## 2023-02-03 HISTORY — DX: Gastro-esophageal reflux disease without esophagitis: K21.9

## 2023-02-03 HISTORY — DX: Other specified conditions associated with female genital organs and menstrual cycle: N94.89

## 2023-02-03 HISTORY — DX: Unspecified asthma, uncomplicated: J45.909

## 2023-02-03 HISTORY — DX: Panic disorder (episodic paroxysmal anxiety): F41.0

## 2023-02-03 HISTORY — DX: Anal fissure, unspecified: K60.2

## 2023-02-03 HISTORY — DX: Depression, unspecified: F32.A

## 2023-02-03 HISTORY — DX: Leiomyoma of uterus, unspecified: D25.9

## 2023-02-03 HISTORY — DX: Centrilobular emphysema: J43.2

## 2023-02-03 HISTORY — DX: Other disorders of lung: J98.4

## 2023-02-03 NOTE — Patient Instructions (Addendum)
Your procedure is scheduled on:02-08-23 Monday Report to the Registration Desk on the 1st floor of the [facility].Then proceed to the 2nd floor Surgery Desk To find out your arrival time, please call [phone] between 1PM - 3PM on:02-05-23 Friday If your arrival time is 6:00 am, do not arrive before that time as the Medical Mall entrance doors do not open until 6:00 am.  REMEMBER: Instructions that are not followed completely may result in serious medical risk, up to and including death; or upon the discretion of your surgeon and anesthesiologist your surgery may need to be rescheduled.  Do not eat food after midnight the night before surgery.  No gum chewing or hard candies.  You may however, drink CLEAR liquids up to 2 hours before you are scheduled to arrive for your surgery. Do not drink anything within 2 hours of your scheduled arrival time.  Clear liquids include: - water  - apple juice without pulp - gatorade (not RED colors) - black coffee or tea (Do NOT add milk or creamers to the coffee or tea) Do NOT drink anything that is not on this list.  In addition, your doctor has ordered for you to drink the provided:  Ensure Pre-Surgery Clear Carbohydrate Drink Drinking this carbohydrate drink up to two hours before surgery helps to reduce insulin resistance and improve patient outcomes. Please complete drinking 2 hours before scheduled arrival time.  One week prior to surgery: Stop Anti-inflammatories (NSAIDS) such as Advil, Aleve, Ibuprofen, Motrin, Naproxen, Naprosyn and Aspirin based products such as Excedrin, Goody's Powder, BC Powder.You may however, continue to take Tylenol/Tylenol/Gabapentin if needed for pain up until the day of surgery. Stop ANY OVER THE COUNTER supplements/vitamins NOW (02-03-23) until after surgery.  TAKE ONLY THESE MEDICATIONS THE MORNING OF SURGERY WITH A SIP OF WATER: -diazepam (VALIUM)  -famotidine (PEPCID) -take one the night before and one on the  morning of surgery - helps to prevent nausea after surgery.)  No Alcohol for 24 hours before or after surgery.  No Smoking including e-cigarettes for 24 hours before surgery.  No chewable tobacco products for at least 6 hours before surgery.  No nicotine patches on the day of surgery.  Do not use any "recreational" drugs for at least a week (preferably 2 weeks) before your surgery.  Please be advised that the combination of cocaine and anesthesia may have negative outcomes, up to and including death. If you test positive for cocaine, your surgery will be cancelled.  On the morning of surgery brush your teeth with toothpaste and water, you may rinse your mouth with mouthwash if you wish. Do not swallow any toothpaste or mouthwash.  Use CHG Soap as directed on instruction sheet.  Do not wear jewelry, make-up, hairpins, clips or nail polish.  Do not wear lotions, powders, or perfumes.   Do not shave body hair from the neck down 48 hours before surgery.  Contact lenses, hearing aids and dentures may not be worn into surgery.  Do not bring valuables to the hospital. [facility] is not responsible for any missing/lost belongings or valuables.   Notify your doctor if there is any change in your medical condition (cold, fever, infection).  Wear comfortable clothing (specific to your surgery type) to the hospital.  After surgery, you can help prevent lung complications by doing breathing exercises.  Take deep breaths and cough every 1-2 hours. Your doctor may order a device called an Incentive Spirometer to help you take deep breaths. When coughing  or sneezing, hold a pillow firmly against your incision with both hands. This is called "splinting." Doing this helps protect your incision. It also decreases belly discomfort.  If you are being admitted to the hospital overnight, leave your suitcase in the car. After surgery it may be brought to your room.  In case of increased patient census,  it may be necessary for you, the patient, to continue your postoperative care in the Same Day Surgery department.  If you are being discharged the day of surgery, you will not be allowed to drive home. You will need a responsible individual to drive you home and stay with you for 24 hours after surgery.   If you are taking public transportation, you will need to have a responsible individual with you.  Please call the Pre-admissions Testing Dept. at [phone] if you have any questions about these instructions.  Surgery Visitation Policy:  Patients having surgery or a procedure may have two visitors.  Children under the age of 64 must have an adult with them who is not the patient.     Preparing for Surgery with CHLORHEXIDINE GLUCONATE (CHG) Soap  Chlorhexidine Gluconate (CHG) Soap  o An antiseptic cleaner that kills germs and bonds with the skin to continue killing germs even after washing  o Used for showering the night before surgery and morning of surgery  Before surgery, you can play an important role by reducing the number of germs on your skin.  CHG (Chlorhexidine gluconate) soap is an antiseptic cleanser which kills germs and bonds with the skin to continue killing germs even after washing.  Please do not use if you have an allergy to CHG or antibacterial soaps. If your skin becomes reddened/irritated stop using the CHG.  1. Shower the NIGHT BEFORE SURGERY and the MORNING OF SURGERY with CHG soap.  2. If you choose to wash your hair, wash your hair first as usual with your normal shampoo.  3. After shampooing, rinse your hair and body thoroughly to remove the shampoo.  4. Use CHG as you would any other liquid soap. You can apply CHG directly to the skin and wash gently with a scrungie or a clean washcloth.  5. Apply the CHG soap to your body only from the neck down. Do not use on open wounds or open sores. Avoid contact with your eyes, ears, mouth, and genitals  (private parts). Wash face and genitals (private parts) with your normal soap.  6. Wash thoroughly, paying special attention to the area where your surgery will be performed.  7. Thoroughly rinse your body with warm water.  8. Do not shower/wash with your normal soap after using and rinsing off the CHG soap.  9. Pat yourself dry with a clean towel.  10. Wear clean pajamas to bed the night before surgery.  12. Place clean sheets on your bed the night of your first shower and do not sleep with pets.  13. Shower again with the CHG soap on the day of surgery prior to arriving at the hospital.  14. Do not apply any deodorants/lotions/powders.  15. Please wear clean clothes to the hospital.

## 2023-02-04 ENCOUNTER — Encounter
Admission: RE | Admit: 2023-02-04 | Discharge: 2023-02-04 | Disposition: A | Discharge status: Home / Self Care | Payer: 59 | Source: Ambulatory Visit | Attending: Obstetrics and Gynecology | Admitting: Obstetrics and Gynecology

## 2023-02-04 ENCOUNTER — Ambulatory Visit: Payer: 59 | Admitting: Physical Therapy

## 2023-02-04 DIAGNOSIS — R102 Pelvic and perineal pain: Secondary | ICD-10-CM | POA: Insufficient documentation

## 2023-02-04 DIAGNOSIS — Z01812 Encounter for preprocedural laboratory examination: Secondary | ICD-10-CM | POA: Insufficient documentation

## 2023-02-04 DIAGNOSIS — R2689 Other abnormalities of gait and mobility: Secondary | ICD-10-CM | POA: Diagnosis not present

## 2023-02-04 DIAGNOSIS — M6281 Muscle weakness (generalized): Secondary | ICD-10-CM | POA: Diagnosis not present

## 2023-02-04 DIAGNOSIS — M533 Sacrococcygeal disorders, not elsewhere classified: Secondary | ICD-10-CM | POA: Diagnosis not present

## 2023-02-04 DIAGNOSIS — R278 Other lack of coordination: Secondary | ICD-10-CM | POA: Diagnosis not present

## 2023-02-04 DIAGNOSIS — G8929 Other chronic pain: Secondary | ICD-10-CM | POA: Insufficient documentation

## 2023-02-04 DIAGNOSIS — M799 Soft tissue disorder, unspecified: Secondary | ICD-10-CM | POA: Insufficient documentation

## 2023-02-04 LAB — COMPREHENSIVE METABOLIC PANEL
ALT: 43 U/L (ref 0–44)
AST: 41 U/L (ref 15–41)
Albumin: 4.5 g/dL (ref 3.5–5.0)
Alkaline Phosphatase: 76 U/L (ref 38–126)
Anion gap: 6 (ref 5–15)
BUN: 11 mg/dL (ref 6–20)
CO2: 27 mmol/L (ref 22–32)
Calcium: 9.4 mg/dL (ref 8.9–10.3)
Chloride: 105 mmol/L (ref 98–111)
Creatinine, Ser: 0.76 mg/dL (ref 0.44–1.00)
GFR, Estimated: >60 mL/min (ref >60)
Glucose, Bld: 97 mg/dL (ref 70–99)
Potassium: 3.5 mmol/L (ref 3.5–5.1)
Sodium: 138 mmol/L (ref 135–145)
Total Bilirubin: 0.7 mg/dL (ref 0.3–1.2)
Total Protein: 7.2 g/dL (ref 6.5–8.1)

## 2023-02-04 LAB — TYPE AND SCREEN
ABO/RH(D): O NEG
Antibody Screen: NEGATIVE

## 2023-02-04 LAB — CBC
HCT: 45.3 % (ref 36.0–46.0)
Hemoglobin: 14.4 g/dL (ref 12.0–15.0)
MCH: 28.9 pg (ref 26.0–34.0)
MCHC: 31.8 g/dL (ref 30.0–36.0)
MCV: 90.8 fL (ref 80.0–100.0)
Platelets: 315 10*3/uL (ref 150–400)
RBC: 4.99 MIL/uL (ref 3.87–5.11)
RDW: 13.1 % (ref 11.5–15.5)
WBC: 5.1 10*3/uL (ref 4.0–10.5)
nRBC: 0 % (ref 0.0–0.2)

## 2023-02-04 NOTE — Patient Instructions (Addendum)
[email or web address]  How your digestive system works - Loletta Parish  ___  [email or web address]   Digestive System, Part 1: Crash Course Anatomy & Physiology #33   ___  [email or web address]  Digestive System, Part 2: Crash Course Anatomy & Physiology #34  __  Research for nutritionist/ functional medicine and work with a professional for better eating habits   Complete food diary  Decrease sugar intake   Decrease use of heating pad to preserve skin.
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
## 2023-02-05 ENCOUNTER — Telehealth: Payer: 59 | Admitting: Family Medicine

## 2023-02-08 ENCOUNTER — Other Ambulatory Visit: Payer: Self-pay

## 2023-02-08 ENCOUNTER — Ambulatory Visit
Admission: RE | Admit: 2023-02-08 | Discharge: 2023-02-08 | Disposition: A | Discharge status: Home / Self Care | Payer: 59 | Attending: Obstetrics and Gynecology | Admitting: Obstetrics and Gynecology

## 2023-02-08 ENCOUNTER — Encounter
Admission: RE | Disposition: A | Discharge status: Home / Self Care | Payer: Self-pay | Source: Home / Self Care | Attending: Obstetrics and Gynecology

## 2023-02-08 ENCOUNTER — Ambulatory Visit: Payer: 59 | Admitting: Urgent Care

## 2023-02-08 ENCOUNTER — Encounter: Payer: Self-pay | Admitting: Obstetrics and Gynecology

## 2023-02-08 ENCOUNTER — Ambulatory Visit: Payer: 59 | Admitting: Certified Registered"

## 2023-02-08 DIAGNOSIS — Z681 Body mass index (BMI) 19 or less, adult: Secondary | ICD-10-CM | POA: Diagnosis not present

## 2023-02-08 DIAGNOSIS — J301 Allergic rhinitis due to pollen: Secondary | ICD-10-CM

## 2023-02-08 DIAGNOSIS — M799 Soft tissue disorder, unspecified: Secondary | ICD-10-CM | POA: Diagnosis present

## 2023-02-08 DIAGNOSIS — R160 Hepatomegaly, not elsewhere classified: Secondary | ICD-10-CM | POA: Diagnosis not present

## 2023-02-08 DIAGNOSIS — Z87891 Personal history of nicotine dependence: Secondary | ICD-10-CM | POA: Insufficient documentation

## 2023-02-08 DIAGNOSIS — Z79899 Other long term (current) drug therapy: Secondary | ICD-10-CM | POA: Diagnosis not present

## 2023-02-08 DIAGNOSIS — N9489 Other specified conditions associated with female genital organs and menstrual cycle: Secondary | ICD-10-CM | POA: Insufficient documentation

## 2023-02-08 DIAGNOSIS — R634 Abnormal weight loss: Secondary | ICD-10-CM | POA: Diagnosis not present

## 2023-02-08 DIAGNOSIS — J432 Centrilobular emphysema: Secondary | ICD-10-CM | POA: Insufficient documentation

## 2023-02-08 DIAGNOSIS — F411 Generalized anxiety disorder: Secondary | ICD-10-CM | POA: Diagnosis not present

## 2023-02-08 DIAGNOSIS — G8929 Other chronic pain: Secondary | ICD-10-CM | POA: Diagnosis present

## 2023-02-08 DIAGNOSIS — R102 Pelvic and perineal pain: Secondary | ICD-10-CM | POA: Diagnosis not present

## 2023-02-08 DIAGNOSIS — J3081 Allergic rhinitis due to animal (cat) (dog) hair and dander: Secondary | ICD-10-CM

## 2023-02-08 HISTORY — PX: XI ROBOT ASSISTED DIAGNOSTIC LAPAROSCOPY: SHX6815

## 2023-02-08 LAB — ABO/RH: ABO/RH(D): O NEG

## 2023-02-08 SURGERY — EXAM UNDER ANESTHESIA
Anesthesia: General | Site: Vagina

## 2023-02-08 MED ORDER — OXYCODONE HCL 5 MG/5ML PO SOLN: 5.0000 mg | Freq: Once | ORAL | Status: AC | PRN

## 2023-02-08 MED ORDER — IBUPROFEN 600 MG PO TABS
600.0000 mg | ORAL_TABLET | Freq: Four times a day (QID) | ORAL | 0 refills | Status: DC | PRN
Start: 2023-02-08 — End: 2023-04-01

## 2023-02-08 MED ORDER — BUPIVACAINE HCL 0.5 % IJ SOLN
INTRAMUSCULAR | Status: DC | PRN
  Administered 2023-02-08: 6 mL

## 2023-02-08 MED ORDER — CHLORHEXIDINE GLUCONATE 0.12 % MT SOLN
OROMUCOSAL | Status: AC
  Filled 2023-02-08: qty 15

## 2023-02-08 MED ORDER — MIDAZOLAM HCL 2 MG/2ML IJ SOLN
INTRAMUSCULAR | Status: DC | PRN
  Administered 2023-02-08: 2 mg via INTRAVENOUS

## 2023-02-08 MED ORDER — SUGAMMADEX SODIUM 200 MG/2ML IV SOLN
INTRAVENOUS | Status: DC | PRN
  Administered 2023-02-08: 200 mg via INTRAVENOUS

## 2023-02-08 MED ORDER — CHLORHEXIDINE GLUCONATE 0.12 % MT SOLN
15.0000 mL | Freq: Once | OROMUCOSAL | Status: AC
  Administered 2023-02-08: 15 mL via OROMUCOSAL

## 2023-02-08 MED ORDER — LACTATED RINGERS IV SOLN: INTRAVENOUS | Status: DC

## 2023-02-08 MED ORDER — EPHEDRINE SULFATE (PRESSORS) 50 MG/ML IJ SOLN
INTRAMUSCULAR | Status: DC | PRN
  Administered 2023-02-08: 10 mg via INTRAVENOUS

## 2023-02-08 MED ORDER — FENTANYL CITRATE (PF) 100 MCG/2ML IJ SOLN
INTRAMUSCULAR | Status: DC | PRN
  Administered 2023-02-08: 25 ug via INTRAVENOUS
  Administered 2023-02-08: 50 ug via INTRAVENOUS

## 2023-02-08 MED ORDER — BUPIVACAINE HCL (PF) 0.5 % IJ SOLN
INTRAMUSCULAR | Status: AC
  Filled 2023-02-08: qty 30

## 2023-02-08 MED ORDER — ONDANSETRON HCL 4 MG/2ML IJ SOLN
INTRAMUSCULAR | Status: DC | PRN
  Administered 2023-02-08: 4 mg via INTRAVENOUS

## 2023-02-08 MED ORDER — PROPOFOL 10 MG/ML IV BOLUS
INTRAVENOUS | Status: AC
  Filled 2023-02-08: qty 20

## 2023-02-08 MED ORDER — OXYCODONE HCL 5 MG PO TABS
5.0000 mg | ORAL_TABLET | Freq: Once | ORAL | Status: AC | PRN
  Administered 2023-02-08: 5 mg via ORAL

## 2023-02-08 MED ORDER — ORAL CARE MOUTH RINSE: 15.0000 mL | Freq: Once | OROMUCOSAL | Status: AC

## 2023-02-08 MED ORDER — OXYCODONE HCL 5 MG PO TABS
ORAL_TABLET | ORAL | Status: AC
  Filled 2023-02-08: qty 1

## 2023-02-08 MED ORDER — EPHEDRINE 5 MG/ML INJ
INTRAVENOUS | Status: AC
  Filled 2023-02-08: qty 5

## 2023-02-08 MED ORDER — FENTANYL CITRATE (PF) 100 MCG/2ML IJ SOLN
25.0000 ug | INTRAMUSCULAR | Status: DC | PRN
  Administered 2023-02-08 (×3): 25 ug via INTRAVENOUS

## 2023-02-08 MED ORDER — PROPOFOL 10 MG/ML IV BOLUS
INTRAVENOUS | Status: DC | PRN
  Administered 2023-02-08: 120 mg via INTRAVENOUS

## 2023-02-08 MED ORDER — HYDROCODONE-ACETAMINOPHEN 5-325 MG PO TABS
1.0000 | ORAL_TABLET | Freq: Four times a day (QID) | ORAL | 0 refills | Status: DC | PRN
Start: 2023-02-08 — End: 2023-04-01

## 2023-02-08 MED ORDER — LIDOCAINE HCL (CARDIAC) PF 100 MG/5ML IV SOSY
PREFILLED_SYRINGE | INTRAVENOUS | Status: DC | PRN
  Administered 2023-02-08: 60 mg via INTRAVENOUS

## 2023-02-08 MED ORDER — FENTANYL CITRATE (PF) 100 MCG/2ML IJ SOLN
INTRAMUSCULAR | Status: AC
  Filled 2023-02-08: qty 2

## 2023-02-08 MED ORDER — MIDAZOLAM HCL 2 MG/2ML IJ SOLN
INTRAMUSCULAR | Status: AC
  Filled 2023-02-08: qty 2

## 2023-02-08 MED ORDER — ROCURONIUM BROMIDE 100 MG/10ML IV SOLN
INTRAVENOUS | Status: DC | PRN
  Administered 2023-02-08: 50 mg via INTRAVENOUS

## 2023-02-08 MED ORDER — LIDOCAINE HCL (PF) 1 % IJ SOLN
INTRAMUSCULAR | Status: AC
  Filled 2023-02-08: qty 5

## 2023-02-08 MED ORDER — DEXAMETHASONE SODIUM PHOSPHATE 10 MG/ML IJ SOLN
INTRAMUSCULAR | Status: DC | PRN
  Administered 2023-02-08: 10 mg via INTRAVENOUS

## 2023-02-08 MED ORDER — SILVER NITRATE-POT NITRATE 75-25 % EX MISC
CUTANEOUS | Status: AC
  Filled 2023-02-08: qty 10

## 2023-02-08 SURGICAL SUPPLY — 64 items
ADH SKN CLS APL DERMABOND .7 (GAUZE/BANDAGES/DRESSINGS) ×2
BAG DRN RND TRDRP ANRFLXCHMBR (UROLOGICAL SUPPLIES) ×2
BAG URINE DRAIN 2000ML AR STRL (UROLOGICAL SUPPLIES) ×3 IMPLANT
BLADE SURG SZ11 CARB STEEL (BLADE) ×3 IMPLANT
CATH FOLEY 2WAY  5CC 16FR (CATHETERS) ×2
CATH FOLEY 2WAY 5CC 16FR (CATHETERS) ×2
CATH URTH 16FR FL 2W BLN LF (CATHETERS) ×3 IMPLANT
COVER TIP SHEARS 8 DVNC (MISCELLANEOUS) ×3 IMPLANT
COVER WAND RF STERILE (DRAPES) ×3 IMPLANT
DERMABOND ADVANCED .7 DNX12 (GAUZE/BANDAGES/DRESSINGS) ×3 IMPLANT
DRAPE 3/4 80X56 (DRAPES) ×3 IMPLANT
DRAPE ARM DVNC X/XI (DISPOSABLE) ×9 IMPLANT
DRAPE COLUMN DVNC XI (DISPOSABLE) ×3 IMPLANT
DRAPE LEGGINS SURG 28X43 STRL (DRAPES) ×3 IMPLANT
DRAPE ROBOT W/ LEGGING 30X125 (DRAPES) IMPLANT
DRAPE UNDER BUTTOCK W/FLU (DRAPES) ×3 IMPLANT
DRSG TELFA 3X8 NADH STRL (GAUZE/BANDAGES/DRESSINGS) IMPLANT
ELECT CAUTERY BLADE 6.4 (BLADE) IMPLANT
ELECT REM PT RETURN 9FT ADLT (ELECTROSURGICAL) ×2
ELECTRODE REM PT RTRN 9FT ADLT (ELECTROSURGICAL) ×3 IMPLANT
FORCEPS BPLR 8 MD DVNC XI (FORCEP) ×3 IMPLANT
FORCEPS BPLR R/ABLATION 8 DVNC (INSTRUMENTS) ×3 IMPLANT
GLOVE BIO SURGEON STRL SZ7 (GLOVE) ×6 IMPLANT
GLOVE BIOGEL PI IND STRL 7.5 (GLOVE) ×9 IMPLANT
GOWN STRL REUS W/ TWL LRG LVL3 (GOWN DISPOSABLE) ×9 IMPLANT
GOWN STRL REUS W/TWL LRG LVL3 (GOWN DISPOSABLE) ×6
GRASPER SUT TROCAR 14GX15 (MISCELLANEOUS) IMPLANT
IRRIGATION STRYKERFLOW (MISCELLANEOUS) IMPLANT
IRRIGATOR STRYKERFLOW (MISCELLANEOUS)
IV NS 1000ML (IV SOLUTION) ×2
IV NS 1000ML BAXH (IV SOLUTION) ×3 IMPLANT
KIT IMAGING PINPOINTPAQ (MISCELLANEOUS) IMPLANT
KIT PINK PAD W/HEAD ARE REST (MISCELLANEOUS) ×2
KIT PINK PAD W/HEAD ARM REST (MISCELLANEOUS) ×3 IMPLANT
KIT TURNOVER CYSTO (KITS) ×3 IMPLANT
LABEL OR SOLS (LABEL) ×3 IMPLANT
MANIFOLD NEPTUNE II (INSTRUMENTS) ×3 IMPLANT
NDL HYPO 22X1.5 SAFETY MO (MISCELLANEOUS) ×3 IMPLANT
NEEDLE HYPO 22X1.5 SAFETY MO (MISCELLANEOUS) ×2 IMPLANT
NS IRRIG 1000ML POUR BTL (IV SOLUTION) ×6 IMPLANT
NS IRRIG 500ML POUR BTL (IV SOLUTION) ×3 IMPLANT
OBTURATOR OPTICAL STND 8 DVNC (TROCAR) ×2
OBTURATOR OPTICALSTD 8 DVNC (TROCAR) ×3 IMPLANT
PACK DNC HYST (MISCELLANEOUS) ×3 IMPLANT
PACK LAP CHOLECYSTECTOMY (MISCELLANEOUS) ×3 IMPLANT
PAD ARMBOARD 7.5X6 YLW CONV (MISCELLANEOUS) ×3 IMPLANT
PAD OB MATERNITY 4.3X12.25 (PERSONAL CARE ITEMS) ×3 IMPLANT
PAD PREP OB/GYN DISP 24X41 (PERSONAL CARE ITEMS) ×3 IMPLANT
SCISSORS METZENBAUM CVD 33 (INSTRUMENTS) IMPLANT
SCISSORS MNPLR CVD DVNC XI (INSTRUMENTS) ×3 IMPLANT
SCRUB CHG 4% DYNA-HEX 4OZ (MISCELLANEOUS) ×3 IMPLANT
SEAL UNIV 5-12 XI (MISCELLANEOUS) ×9 IMPLANT
SEALER VESSEL EXT DVNC XI (MISCELLANEOUS) IMPLANT
SET CYSTO W/LG BORE CLAMP LF (SET/KITS/TRAYS/PACK) IMPLANT
SOL ELECTROSURG ANTI STICK (MISCELLANEOUS) ×2
SOLUTION ELECTROSURG ANTI STCK (MISCELLANEOUS) ×3 IMPLANT
SURGILUBE 2OZ TUBE FLIPTOP (MISCELLANEOUS) ×3 IMPLANT
SUT MNCRL 4-0 (SUTURE) ×2
SUT MNCRL 4-0 27XMFL (SUTURE) ×2
SUT VICRYL 0 UR6 27IN ABS (SUTURE) IMPLANT
SUTURE MNCRL 4-0 27XMF (SUTURE) ×3 IMPLANT
SYR 10ML LL (SYRINGE) ×3 IMPLANT
TOWEL OR 17X26 4PK STRL BLUE (TOWEL DISPOSABLE) IMPLANT
TUBING EVAC SMOKE HEATED PNEUM (TUBING) ×3 IMPLANT

## 2023-02-08 NOTE — Anesthesia Preprocedure Evaluation (Signed)
Anesthesia Evaluation  Patient identified by MRN, date of birth, ID band Patient awake    Reviewed: Allergy & Precautions, NPO status , Patient's Chart, lab work & pertinent test results  History of Anesthesia Complications Negative for: history of anesthetic complications  Airway Mallampati: III  TM Distance: <3 FB Neck ROM: full    Dental  (+) Chipped, Poor Dentition, Missing   Pulmonary asthma , COPD, Patient abstained from smoking., former smoker   Pulmonary exam normal        Cardiovascular Exercise Tolerance: Good (-) angina (-) Past MI negative cardio ROS Normal cardiovascular exam     Neuro/Psych  PSYCHIATRIC DISORDERS      negative neurological ROS     GI/Hepatic Neg liver ROS,GERD  Controlled,,  Endo/Other  negative endocrine ROS    Renal/GU      Musculoskeletal   Abdominal   Peds  Hematology negative hematology ROS (+)   Anesthesia Other Findings Past Medical History: No date: Anal fissure No date: Anemia     Comment:  h/o in teens No date: Anxiety No date: Asthma No date: Centrilobular emphysema (HCC) No date: Chronic anal fissure 2016: COPD, mild (HCC) No date: Depression 2016: Ectopic pregnancy No date: Elevated IgE level No date: Female pelvic congestion syndrome No date: GERD (gastroesophageal reflux disease)     Comment:  occ No date: Hemorrhoids No date: History of kidney stones No date: Hyperinflation of lungs No date: Internal and external thrombosed hemorrhoids No date: Panic attacks No date: Pre-diabetes No date: Restrictive lung disease No date: Unintended weight loss No date: Uterine leiomyoma  Past Surgical History: No date: ADENOIDECTOMY 08/12/2022: BOTOX INJECTION; N/A     Comment:  Procedure: BOTOX INJECTION;  Surgeon: Carolan Shiver, MD;  Location: [facility] ORS;  Service: General;                Laterality: N/A; No date: SIGMOIDOSCOPY  BMI     Body Mass Index: 16.01 kg/m      Reproductive/Obstetrics negative OB ROS                             Anesthesia Physical Anesthesia Plan  ASA: 3  Anesthesia Plan: General ETT   Post-op Pain Management:    Induction: Intravenous  PONV Risk Score and Plan: Ondansetron, Dexamethasone, Midazolam and Treatment may vary due to age or medical condition  Airway Management Planned: Oral ETT  Additional Equipment:   Intra-op Plan:   Post-operative Plan: Extubation in OR  Informed Consent: I have reviewed the patients History and Physical, chart, labs and discussed the procedure including the risks, benefits and alternatives for the proposed anesthesia with the patient or authorized representative who has indicated his/her understanding and acceptance.     Dental Advisory Given  Plan Discussed with: Anesthesiologist, CRNA and Surgeon  Anesthesia Plan Comments: (Patient consented for risks of anesthesia including but not limited to:  - adverse reactions to medications - damage to eyes, teeth, lips or other oral mucosa - nerve damage due to positioning  - sore throat or hoarseness - Damage to heart, brain, nerves, lungs, other parts of body or loss of life  Patient voiced understanding.)       Anesthesia Quick Evaluation

## 2023-02-08 NOTE — Interval H&P Note (Signed)
History and Physical Interval Note:  02/08/2023 7:50 AM  Lori Davies  has presented today for surgery, with the diagnosis of chronic pelvic pain, soft tissue lesion pelvic region.  The various methods of treatment have been discussed with the patient and family. After consideration of risks, benefits and other options for treatment, the patient has consented to  Procedure(s): EXAM UNDER ANESTHESIA (N/A) XI ROBOT ASSISTED DIAGNOSTIC LAPAROSCOPY (N/A) as a surgical intervention.  The patient's history has been reviewed, patient examined, no change in status, stable for surgery.  I have reviewed the patient's chart and labs.  Questions were answered to the patient's satisfaction.  Reviewed risks/benefits of procedure, including a negative finding with surgery. This means that we could perform the surgery and not be able to provide any answers afterward about why she is having so much rectal pain. Patient voiced understanding and agreement to proceed.   Thomasene Mohair, MD, [facility] 02/08/2023 7:50 AM

## 2023-02-08 NOTE — Anesthesia Procedure Notes (Signed)
Procedure Name: Intubation Date/Time: 02/08/2023 8:30 AM  Performed by: Danelle Berry, CRNAPre-anesthesia Checklist: Patient identified, Emergency Drugs available, Suction available and Patient being monitored Patient Re-evaluated:Patient Re-evaluated prior to induction Oxygen Delivery Method: Circle system utilized Preoxygenation: Pre-oxygenation with 100% oxygen Induction Type: IV induction Ventilation: Mask ventilation without difficulty Laryngoscope Size: McGraph and 3 Grade View: Grade I Tube type: Oral Tube size: 6.5 mm Number of attempts: 1 Airway Equipment and Method: Stylet and Oral airway Placement Confirmation: ETT inserted through vocal cords under direct vision, positive ETCO2 and breath sounds checked- equal and bilateral Tube secured with: Tape Dental Injury: Teeth and Oropharynx as per pre-operative assessment

## 2023-02-08 NOTE — Discharge Instructions (Signed)
AMBULATORY SURGERY  ?DISCHARGE INSTRUCTIONS ? ? ?The drugs that you were given will stay in your system until tomorrow so for the next 24 hours you should not: ? ?Drive an automobile ?Make any legal decisions ?Drink any alcoholic beverage ? ? ?You may resume regular meals tomorrow.  Today it is better to start with liquids and gradually work up to solid foods. ? ?You may eat anything you prefer, but it is better to start with liquids, then soup and crackers, and gradually work up to solid foods. ? ? ?Please notify your doctor immediately if you have any unusual bleeding, trouble breathing, redness and pain at the surgery site, drainage, fever, or pain not relieved by medication. ? ? ? ?Additional Instructions: ? ? ? ?Please contact your physician with any problems or Same Day Surgery at [phone], Monday through Friday 6 am to 4 pm, or [place] Main number at [phone].  ?

## 2023-02-08 NOTE — Anesthesia Postprocedure Evaluation (Signed)
Anesthesia Post Note  Patient: Lori Davies  Procedure(s) Performed: Francia Greaves UNDER ANESTHESIA (Vagina ) XI ROBOT ASSISTED DIAGNOSTIC LAPAROSCOPY (Abdomen)  Patient location during evaluation: PACU Anesthesia Type: General Level of consciousness: awake and alert Pain management: pain level controlled Vital Signs Assessment: post-procedure vital signs reviewed and stable Respiratory status: spontaneous breathing, nonlabored ventilation, respiratory function stable and patient connected to nasal cannula oxygen Cardiovascular status: blood pressure returned to baseline and stable Postop Assessment: no apparent nausea or vomiting Anesthetic complications: no   No notable events documented.   Last Vitals:  Vitals:   02/08/23 1030 02/08/23 1045  BP: (!) 89/46 (!) 86/58  Pulse: 65 66  Resp: 16 15  Temp:    SpO2: 97% 100%    Last Pain:  Vitals:   02/08/23 1042  TempSrc:   PainSc: 7                  Cleda Mccreedy Khy Pitre

## 2023-02-08 NOTE — Progress Notes (Signed)
Redness noted to patient lower abdomen, per OR RN patient states it was done from a heating pad. No blisters noted.

## 2023-02-08 NOTE — Transfer of Care (Signed)
Immediate Anesthesia Transfer of Care Note  Patient: Lori Davies  Procedure(s) Performed: Francia Greaves UNDER ANESTHESIA (Vagina ) XI ROBOT ASSISTED DIAGNOSTIC LAPAROSCOPY (Abdomen)  Patient Location: PACU  Anesthesia Type:General  Level of Consciousness: sedated  Airway & Oxygen Therapy: Patient Spontanous Breathing and Patient connected to face mask  Post-op Assessment: Report given to RN and Post -op Vital signs reviewed and stable  Post vital signs: Reviewed and stable  Last Vitals:  Vitals Value Taken Time  BP 92/59 02/08/23 1007  Temp    Pulse 64 02/08/23 1009  Resp 10 02/08/23 1009  SpO2 100 % 02/08/23 1009  Vitals shown include unvalidated device data.  Last Pain:  Vitals:   02/08/23 0723  TempSrc: Temporal  PainSc: 0-No pain      Patients Stated Pain Goal: 0 (02/08/23 0723)  Complications: No notable events documented.

## 2023-02-08 NOTE — Op Note (Signed)
Operative Note    Name: Lori Davies  Date of Service: 02/08/2023  DOB: 08/03/1971  MRN: [id number]   Pre-Operative Diagnosis:  1) Chronic Pelvic pain in female 2) Soft tissue lesion of pelvic area 3) Unintended weight loss  Post-Operative Diagnosis:  1) Chronic Pelvic pain in female 2) Soft tissue lesion of pelvic area 3) Unintended weight loss 4) mild hepatomegaly  Procedures:  1) Exam under anesthesia 2) Robot assisted diagnostic laparoscopy  Primary Surgeon: Thomasene Mohair, MD   EBL: minimal mL   IVF: 800 mL   Urine output: 600 mL  Specimens: none  Drains: none  Complications: None   Disposition: PACU   Condition: Stable   Findings:  1) Normal external female genitalia 2) Atrophic vaginal epithelium with small abrasion in posterior fourchette that is likely the result of the vaginal sterilization process for surgery.  Normal-appearing cervix. Normal bimanual pelvic exam.  Hemorrhoids visualized. 3) Enlarged liver. No visible lesion. 4) normal appearing appendix 5) Visible portions of small bowel appear normal. The entire colonic and small bowel structures were not visualized. 6) Normal anterior pelvic anatomy 7) Prominence of left adnexal vasculature, somewhat greater than right.  8) Normal-appearing pelvic floor. 9) No significant visible vascularity around the cervix and uterosacral ligaments.  Procedure Summary:  The patient was taken to the operating room where general anesthesia was administered and found to be adequate. She was placed in the dorsal supine lithotomy position in [place] stirrups and prepped and draped in usual sterile fashion. After a timeout was called an indwelling catheter was placed in her bladder.  An exam under anesthesia was performed with the above-noted findings.  An external exam was performed. A sterile speculum was placed in her vagina to visualize her cervix, which has no abnormal masses visualized or later palpated. The OR  is not set up to collect cytology for pap smears.  The speculum was removed and a bimanual exam was performed.  A sponge on a stick was placed in her vagina for uterine manipulation.  Attention was turned to the abdomen where after injection of local anesthetic, an 8 mm infraumbilical incision was made with the scalpel. Entry into the abdomen was obtained via Optiview trocar technique (a blunt entry technique with camera visualization through the obturator upon entry). Verification of entry into the abdomen was obtained using opening pressures. The abdomen was insufflated with CO2. The camera was introduced through the trocar with verification of atraumatic entry.  Right and left abdominal entry sites were created after injection of local anesthetic about 8 cm away from the umbilical port in accordance with the Intuitive manufacturer's recommendations.  The port sites were 8 mm.  The intuitive trochars were introduced under intra-abdominal camera visualization without difficulty.  The XI robot was docked on the patient's left.  Clearance was verified from the patient's legs.  Through the umbilical port the camera was placed.  Through the port attached to arm 4 the monopolar scissors were placed.  Through the port attached to arm 2 the forced bipolar forceps were was placed.    A survey of the abdominal structures was performed as well as possible, given the docking angle. Prior to docking the robot, the laparoscope was utilized to visualized the upper abdomen with the above-noted findings.  The appendix was visualized. She had a large amount of small bowel filling her pelvis. So, the small bowel was carefully maneuvered to visualize the posterior cul-de-sac. The ovaries and associated vasculature were visualized with the above-noted  findings.  The lateral uterine and cervical vasculature was visualized and appeared normal, or except as noted above.  The ureters were easily visualized and appeared to have  normal peristalsis.  With no abnormal findings nor abnormal tissue, the procedure was terminated. The instruments were removed. The robot was undocked. The abdomen and pelvis were emptied of CO2 with the aid of 5 deep breaths from anesthesia. The trocars were removed without difficulty.  The skin was closed with 4-0 monocryl in a subcuticular fashion and reinforced using surgical skin glue.  The vaginal sponge stick and foley catheter were removed.   The patient tolerated the procedure well.  Sponge, lap, needle, and instrument counts were correct x 2.  VTE prophylaxis: SCDs. Antibiotic prophylaxis: none indicated and none given. She was awakened in the operating room and was taken to the PACU in stable condition.   Thomasene Mohair, MD 02/08/2023 9:44 AM

## 2023-02-09 ENCOUNTER — Encounter: Payer: Self-pay | Admitting: Obstetrics and Gynecology

## 2023-02-09 ENCOUNTER — Ambulatory Visit: Payer: 59 | Admitting: Physical Therapy

## 2023-02-16 ENCOUNTER — Encounter: Payer: Self-pay | Admitting: Obstetrics and Gynecology

## 2023-02-16 ENCOUNTER — Ambulatory Visit: Payer: 59 | Admitting: Physical Therapy

## 2023-02-16 DIAGNOSIS — F411 Generalized anxiety disorder: Secondary | ICD-10-CM | POA: Diagnosis not present

## 2023-02-16 DIAGNOSIS — F329 Major depressive disorder, single episode, unspecified: Secondary | ICD-10-CM | POA: Diagnosis not present

## 2023-02-23 ENCOUNTER — Ambulatory Visit: Payer: 59 | Admitting: Physical Therapy

## 2023-02-23 DIAGNOSIS — F329 Major depressive disorder, single episode, unspecified: Secondary | ICD-10-CM | POA: Diagnosis not present

## 2023-02-23 DIAGNOSIS — F411 Generalized anxiety disorder: Secondary | ICD-10-CM | POA: Diagnosis not present

## 2023-02-23 DIAGNOSIS — R102 Pelvic and perineal pain: Secondary | ICD-10-CM | POA: Diagnosis not present

## 2023-02-23 DIAGNOSIS — G8929 Other chronic pain: Secondary | ICD-10-CM | POA: Diagnosis not present

## 2023-02-23 DIAGNOSIS — R634 Abnormal weight loss: Secondary | ICD-10-CM | POA: Diagnosis not present

## 2023-02-23 DIAGNOSIS — E639 Nutritional deficiency, unspecified: Secondary | ICD-10-CM | POA: Diagnosis not present

## 2023-03-02 ENCOUNTER — Ambulatory Visit: Payer: 59 | Admitting: Physical Therapy

## 2023-03-02 ENCOUNTER — Ambulatory Visit: Payer: 59 | Attending: General Surgery | Admitting: Physical Therapy

## 2023-03-02 ENCOUNTER — Encounter (INDEPENDENT_AMBULATORY_CARE_PROVIDER_SITE_OTHER): Payer: 59 | Admitting: Family Medicine

## 2023-03-02 DIAGNOSIS — M6281 Muscle weakness (generalized): Secondary | ICD-10-CM | POA: Diagnosis not present

## 2023-03-02 DIAGNOSIS — M792 Neuralgia and neuritis, unspecified: Secondary | ICD-10-CM

## 2023-03-02 DIAGNOSIS — G8929 Other chronic pain: Secondary | ICD-10-CM

## 2023-03-02 DIAGNOSIS — M533 Sacrococcygeal disorders, not elsewhere classified: Secondary | ICD-10-CM

## 2023-03-02 DIAGNOSIS — K6289 Other specified diseases of anus and rectum: Secondary | ICD-10-CM

## 2023-03-02 DIAGNOSIS — R278 Other lack of coordination: Secondary | ICD-10-CM

## 2023-03-02 DIAGNOSIS — R2689 Other abnormalities of gait and mobility: Secondary | ICD-10-CM

## 2023-03-02 MED ORDER — PREGABALIN 50 MG PO CAPS
50.0000 mg | ORAL_CAPSULE | Freq: Two times a day (BID) | ORAL | 0 refills | Status: DC
Start: 2023-03-02 — End: 2023-04-01
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
## 2023-03-02 NOTE — Telephone Encounter (Signed)
Please see the MyChart message reply(ies) for my assessment and plan.    This patient gave consent for this Medical Advice Message and is aware that it may result in a bill to their insurance company, as well as the possibility of receiving a bill for a co-payment or deductible. They are an established patient, but are not seeking medical advice exclusively about a problem treated during an in person or video visit in the last seven days. I did not recommend an in person or video visit within seven days of my reply.    I spent a total of 7 minutes cumulative time within 7 days through MyChart messaging.  Jazzlyn Huizenga, DO   

## 2023-03-03 NOTE — Patient Instructions (Signed)
Create a list of foods, dislike versus food you have been eating  Limit sugar intake, resort to chicken broths, veg soup, whole foods for nutrition and calories instead of ice cream milkshakes   Focus on your nutrititionist's recommendations to minimize constipation which will help with minimizing rectal pain  Continue with PT exercises

## 2023-03-04 DIAGNOSIS — F411 Generalized anxiety disorder: Secondary | ICD-10-CM | POA: Diagnosis not present

## 2023-03-04 DIAGNOSIS — F329 Major depressive disorder, single episode, unspecified: Secondary | ICD-10-CM | POA: Diagnosis not present

## 2023-03-09 ENCOUNTER — Ambulatory Visit: Payer: 59 | Admitting: Physical Therapy

## 2023-03-15 ENCOUNTER — Encounter: Payer: Self-pay | Admitting: Dietician

## 2023-03-15 ENCOUNTER — Encounter: Payer: 59 | Attending: Obstetrics and Gynecology | Admitting: Dietician

## 2023-03-15 VITALS — Ht 66.0 in | Wt 91.1 lb

## 2023-03-15 DIAGNOSIS — F329 Major depressive disorder, single episode, unspecified: Secondary | ICD-10-CM | POA: Diagnosis not present

## 2023-03-15 DIAGNOSIS — E639 Nutritional deficiency, unspecified: Secondary | ICD-10-CM | POA: Diagnosis not present

## 2023-03-15 DIAGNOSIS — Z713 Dietary counseling and surveillance: Secondary | ICD-10-CM | POA: Diagnosis not present

## 2023-03-15 DIAGNOSIS — R634 Abnormal weight loss: Secondary | ICD-10-CM | POA: Diagnosis not present

## 2023-03-15 DIAGNOSIS — F411 Generalized anxiety disorder: Secondary | ICD-10-CM | POA: Diagnosis not present

## 2023-03-15 NOTE — Patient Instructions (Addendum)
Choose Fairlife whole milk in place of the almond milk in your weight gain shake. Increase your protein to 2 scoops.  Consider increasing your fiber gummies up to 2 per day. This will get you 4g of soluble fiber.  Look into some homemade oatmeal cookie recipes to add some extra soluble fiber daily.  Have a spoonful of peanut butter whenever you want throughout the day!  Consider trying protein muffin mix or any high protein muffin recipe that appeals to your food preferences!!  Continue to walk daily and keep it to 1 mile temporarily until weight gain is continual!

## 2023-03-15 NOTE — Progress Notes (Signed)
Medical Nutrition Therapy  Appointment Start time:  1515  Appointment End time:  1620  Primary concerns today: Weight Gain Referral diagnosis: R63.4 - Abnormal Weight Loss, E63.9 - Nutritional Deficiency Preferred learning style: No preference indicated Learning readiness: Ready   NUTRITION ASSESSMENT   Anthropometrics  Ht: 5'6" Wt: 91.1 lbs BMI: 14.70 kg/m2 UBW: 125 lbs Goal Weight: 120 lbs  Clinical Medical Hx: GERD, GAD, Elevated A1c, Hypotension Medications: Gabapentin, Sertraline, Valium, Miralax, Fiber  Labs (11/10/2022): A1c - 5.8% Notable Signs/Symptoms: Frail appearance, pronounced cheeks  Lifestyle & Dietary Hx Pt reports anal fissure/hemorrhoid problems that can be incredibly painful and make them afraid to eat. Pt reports taking Miralax BID, using topical cream, and doing pelvic floor therapy. Pt reports losing ~40-50 lbs since September 2023 when fissure started. Pt had recent procedure to rule out cancer, results negative.   Pt reports mixing weight gain powder with ice cream and almond milk as a supplement, usually has it around 1:00 PM. Pt reports being a picky eater, won't eat red meat, eggs, only like certain vegetables. Pt states their husband encourages them to eat, helps prepare food. Pt reports walking ~2 miles daily, breaks it up into ~15 minute sections about 4 times a day.   Estimated daily fluid intake: 80 oz Supplements: Fiber Sleep: Sleeps well Stress / self-care: Generally low stress, pain causes occasional stress. Current average weekly physical activity: Walks daily    24-Hr Dietary Recall First Meal (10:00 AM): Multigrain english muffin w/ butter, grape jam, 4 strips bacon, raspberries Snack: Weight gain shake Second Meal (4:00 PM): Chicken and rice soup, cupcake Snack: none Third Meal (7:30 - 8:00 PM): Chicken pot pie Snack: Cupcake, apple Beverages: Water   NUTRITION DIAGNOSIS  NI-1.4 Inadequate energy intake As related to underweight.   As evidenced by BMI of 14.70 kg/m2, self-reported fear to eat a/w anal fissure pain, 50 lbs weight loss in last~10 months.   NUTRITION INTERVENTION  Nutrition education (E-1) on the following topics:  Educated patient on the role of increasing calories and protein on building muscle mass and healthy weight gain. Worked with patient to identify sources of protein and healthy fats, as well as how to increase calories when preparing food. Educated patient to eat 5-6 times a day to maximize protein intake. Encourage patient to incorporate physical activity, especially resistance training, as much as possible.   Handouts Provided Include  High Protein High Calorie Nutrition Therapy Soluble and Insoluble Fiber list  Learning Style & Readiness for Change Teaching method utilized: Visual & Auditory  Demonstrated degree of understanding via: Teach Back  Barriers to learning/adherence to lifestyle change: Fear of pain  Goals Established by Pt Choose Fairlife whole milk in place of the almond milk in your weight gain shake. Increase your protein to 2 scoops. Consider increasing your fiber gummies up to 2 per day. This will get you 4g of soluble fiber. Look into some homemade oatmeal cookie recipes to add some extra soluble fiber daily. Have a spoonful of peanut butter whenever you want throughout the day! Consider trying protein muffin mix or any high protein muffin recipe that appeals to your food preferences!! Continue to walk daily and keep it to 1 mile temporarily until weight gain is continual!   MONITORING & EVALUATION Dietary intake, weekly physical activity, and weight gain in 4-6 weeks.  Next Steps  Patient is to incorporare more energy dense foods, follow up with RD.

## 2023-03-16 ENCOUNTER — Ambulatory Visit: Payer: 59 | Admitting: Physical Therapy

## 2023-03-16 DIAGNOSIS — R278 Other lack of coordination: Secondary | ICD-10-CM | POA: Diagnosis not present

## 2023-03-16 DIAGNOSIS — M533 Sacrococcygeal disorders, not elsewhere classified: Secondary | ICD-10-CM

## 2023-03-16 DIAGNOSIS — M6281 Muscle weakness (generalized): Secondary | ICD-10-CM

## 2023-03-16 DIAGNOSIS — R2689 Other abnormalities of gait and mobility: Secondary | ICD-10-CM

## 2023-03-17 DIAGNOSIS — K644 Residual hemorrhoidal skin tags: Secondary | ICD-10-CM | POA: Diagnosis not present

## 2023-03-17 DIAGNOSIS — K6289 Other specified diseases of anus and rectum: Secondary | ICD-10-CM | POA: Diagnosis not present
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
## 2023-03-17 NOTE — Patient Instructions (Signed)
Prone Heel Press for strengthening sacro-iliac joints  1. Lie on your belly. If you have an arch in your low back or it feels umcomfortable, place a pillow under your low belly/hips to make sure your low back feel comfortable.   2. Place our forehead on top of your palms.      Widen your knees apart for starting position.   3. Inhale, feel belly and low back expand  4. Exhale, feel belly hug in, press heel together and count aloud for 5 sec. Then relax the heel squeezing.  Perform 10 reps of 5 sec holds. 2 sets/ day.    If you feel entire buttock tighten too much or feel low back pain, apply 50% less effort. As you press your heel together, you will feel as if your pubic bone (front of your pelvis) and sacrum (back of your pelvis) gentle move towards each other or your low abdominal muscles hug in more.   __  Marjo Bicker pose rocking   Toes tucked, shoulders down and back, on forearms , hands shoulder width apart, fingers straight, elbow back , squeeze imaginary pencils in armpit, shoulder down and away from ears  10 reps   ___  Leg lift 30 deg on exhale alternating  For strengthening gluts

## 2023-03-18 DIAGNOSIS — F331 Major depressive disorder, recurrent, moderate: Secondary | ICD-10-CM | POA: Diagnosis not present

## 2023-03-18 DIAGNOSIS — F4322 Adjustment disorder with anxiety: Secondary | ICD-10-CM | POA: Diagnosis not present

## 2023-03-22 ENCOUNTER — Encounter: Payer: Self-pay | Admitting: Internal Medicine

## 2023-03-22 ENCOUNTER — Ambulatory Visit (INDEPENDENT_AMBULATORY_CARE_PROVIDER_SITE_OTHER): Payer: 59 | Admitting: Internal Medicine

## 2023-03-22 VITALS — BP 104/62 | HR 72 | Temp 95.5°F | Wt 90.0 lb

## 2023-03-22 DIAGNOSIS — R630 Anorexia: Secondary | ICD-10-CM

## 2023-03-22 DIAGNOSIS — F410 Panic disorder [episodic paroxysmal anxiety] without agoraphobia: Secondary | ICD-10-CM

## 2023-03-22 DIAGNOSIS — R64 Cachexia: Secondary | ICD-10-CM

## 2023-03-22 DIAGNOSIS — R634 Abnormal weight loss: Secondary | ICD-10-CM | POA: Diagnosis not present

## 2023-03-22 DIAGNOSIS — F32A Depression, unspecified: Secondary | ICD-10-CM | POA: Diagnosis not present

## 2023-03-22 DIAGNOSIS — F419 Anxiety disorder, unspecified: Secondary | ICD-10-CM

## 2023-03-22 NOTE — Patient Instructions (Signed)
Eating Disorders An eating disorder is a medical and mental health condition. Over time, eating disorders damage the body and have an impact on mood and mental health. Eating disorders can be life-threatening. The most common eating disorders are: Bulimia nervosa. This type involves eating large amounts of food in a short period of time (binge). This is often followed by getting rid of the calories that were eaten (purging) by vomiting, exercising too much, or taking laxative medicines. Bulimia may start as a way to control weight. Later, it may be triggered by stress or an emotional crisis. Anorexia nervosa. This type involves having extremely low body weight from severe dieting, too much exercising, or both. Losing weight or preventing weight gain becomes an obsession. Anorexia is often used as a way to cope with emotional problems. Binge eating disorder (BED). This type involves eating a large amount of food in 2 hours or less and feeling out of control overeating. People who have BED eat too quickly, feel very full, eat when they are not hungry, and usually eat alone. Typically, a binge happens three or more times a week. Other specified feeding or eating disorder. This is when a person has some symptoms of BED, bulimia nervosa, or anorexia nervosa, but not enough symptoms to diagnose a specific disorder. Problems caused by eating disorders Eating disorders can lead to serious medical problems. These may include: Not having enough food or nutrients (malnutrition). Hormone imbalance. Not having enough vitamins and minerals in the body. Damage to organs. Being very underweight or overweight. Damage to the teeth, jaw, and esophagus. What causes eating disorders? The cause of an eating disorder is not known. However, an eating disorder may be influenced by: Having a family history of eating disorders. Experiencing trauma. Frequently dieting. Having a lot of stress. Having other mental health  issues. Focusing on messages from society about beauty and body image. What are the symptoms of an eating disorder? Symptoms of an eating disorder include being obsessed with food, eating, body weight, and appearance. People with eating disorders may also have other medical problems due to eating behavior or the behaviors used to lose weight. Symptoms of anorexia include: Severely restricting food intake. Purging calories through exercise, vomiting, or the use of medicines. Having a distorted view of one's weight or size. Symptoms of bulimia include: Binge eating. People who binge eat consume an extreme amount of food in 2 hours or less. Following a binge, bulimia involves getting rid of the food by vomiting, exercising, or using medicines. Feeling a lot of shame about eating and purging behavior. Being very concerned with weight. Symptoms of BED include: Binge eating, which involves eating large amounts of food in 2 hours or less. Frequently eating more than intended and feeling out of control. Feeling disgusted and embarrassed about one's eating habits. How are eating disorders treated? Treatment for an eating disorder may include: Therapy or counseling sessions with specialists in eating disorders. Seeing a diet and nutrition expert (dietitian). Getting the right amount of exercise. Taking medicines for anxiety or depression. Staying in the hospital or going to an eating disorders program. Follow these instructions at home: Lifestyle  Learn about eating disorders. Know what situations trigger your symptoms. Make a plan to help you deal with these situations. Resist weighing yourself or checking yourself in the mirror often. Find programs or resources for people with eating disorders. Talk with an eating disorder specialist, therapist, or counselor about your eating behavior. General instructions Follow instructions from your health  care provider about eating and exercising. Return  to your normal activities as told by your health care provider. Ask your health care provider what activities are safe for you. Get regular dental care every 6 months. Keep all follow-up visits. This is important. Take herbal remedies or over-the-counter and prescription medicines only as told by your health care provider. Where to find support National Eating Disorders: www.nationaleatingdisorders.org National Eating Disorders Helpline: 1-385 217 1590 [facility] of Mental Health: [employer] Summary Eating disorders are medical and mental health conditions. Eating disorders can be life-threatening. Treatment may include therapy or medicines. Talk with an eating disorder specialist, therapist, or counselor about your eating behavior. This information is not intended to replace advice given to you by your health care provider. Make sure you discuss any questions you have with your health care provider. Document Revised: 02/01/2021 Document Reviewed: 02/01/2021 Elsevier Patient Education  2024 [employer].

## 2023-03-22 NOTE — Progress Notes (Signed)
Subjective:    Patient ID: Lori Davies, female    DOB: 20-Apr-1971, 52 y.o.   MRN: [id number]  HPI  Patient presents to clinic today for follow-up of anxiety, depression and panic attacks.  This is a chronic issue that worsened about 9 months ago after having to deal with anal fissure.  She reports during that time she did not eat much because she was "petrified" of having to have a bowel movement because of the pain.  This is currently managed on sertraline and diazepam but she does not feel like it is controlling her anxiety or depression well. She does feel like she is having panic attacks. She feels like she is a burden to her family. She reports she has no appetite and has had unintentional weight loss. She has gone from 133 to 91 in 9 months. She reports she has no appetite. It's not that she can't taste food and she does not get physically sick when she looks at food. She reports she does have gurgling in her abdomen when she eats but it is not painful. She is having a daily BM with mirilax. She has a nutritionist but has not noted that this has helped her gain weight. She is currently seeing a virtual therapist and psychiatrist who prescribed mirtazapine but she did not want to start this due to the side effects.  She denies SI/HI. She has no history of eating disorders.  Review of Systems     Past Medical History:  Diagnosis Date   Anal fissure    Anemia    h/o in teens   Anxiety    Asthma    Centrilobular emphysema (HCC)    Chronic anal fissure    COPD, mild (HCC) 2016   Depression    Ectopic pregnancy 2016   Elevated IgE level    Female pelvic congestion syndrome    GERD (gastroesophageal reflux disease)    occ   Hemorrhoids    History of kidney stones    Hyperinflation of lungs    Internal and external thrombosed hemorrhoids    Panic attacks    Pre-diabetes    Restrictive lung disease    Unintended weight loss    Uterine leiomyoma     Current Outpatient  Medications  Medication Sig Dispense Refill   conjugated estrogens (PREMARIN) vaginal cream Place 1 applicator vaginally daily.     diazepam (VALIUM) 10 MG tablet Take 1 tablet (10 mg total) by mouth every 12 (twelve) hours. 60 tablet 2   famotidine (PEPCID) 10 MG tablet Take 10 mg by mouth as needed for heartburn or indigestion.     HYDROcodone-acetaminophen (NORCO/VICODIN) 5-325 MG tablet Take 1 tablet by mouth every 6 (six) hours as needed (breakthrough pain). 10 tablet 0   ibuprofen (ADVIL) 600 MG tablet Take 1 tablet (600 mg total) by mouth every 6 (six) hours as needed for mild pain or moderate pain. 30 tablet 0   montelukast (SINGULAIR) 10 MG tablet Take 1 tablet (10 mg total) by mouth as needed.     pregabalin (LYRICA) 50 MG capsule Take 1 capsule (50 mg total) by mouth 2 (two) times daily. 60 capsule 0   sertraline (ZOLOFT) 25 MG tablet Take 2 tablets (50 mg total) by mouth daily after lunch. 2 pm     No current facility-administered medications for this visit.    Allergies  Allergen Reactions   Clavulanic Acid Nausea And Vomiting   Fish Oil Hives   Sulfa  Antibiotics Nausea Only    Family History  Problem Relation Age of Onset   Heart disease Mother    Lung disease Mother    Heart disease Father    Hypertension Father    Breast cancer Maternal Aunt    Diabetes Maternal Aunt    Autism Son    Colon cancer Neg Hx     Social History   Socioeconomic History   Marital status: Married    Spouse name: Jeannett Senior   Number of children: 3   Years of education: Not on file   Highest education level: Not on file  Occupational History   Not on file  Tobacco Use   Smoking status: Former    Packs/day: 0.50    Years: 20.00    Additional pack years: 0.00    Total pack years: 10.00    Types: Cigarettes    Quit date: 03/21/2018    Years since quitting: 5.0   Smokeless tobacco: Never  Vaping Use   Vaping Use: Every day   Substances: Nicotine, Flavoring  Substance and Sexual  Activity   Alcohol use: No    Alcohol/week: 0.0 standard drinks of alcohol   Drug use: No   Sexual activity: Yes  Other Topics Concern   Not on file  Social History Narrative   Not on file   Social Determinants of Health   Financial Resource Strain: Not on file  Food Insecurity: Not on file  Transportation Needs: Not on file  Physical Activity: Not on file  Stress: Not on file  Social Connections: Not on file  Intimate Partner Violence: Not on file     Constitutional: Patient reports fatigue, unintentional weight loss.  Denies fever, malaise, headache.  HEENT: Denies eye pain, eye redness, ear pain, ringing in the ears, wax buildup, runny nose, nasal congestion, bloody nose, or sore throat. Respiratory: Denies difficulty breathing, shortness of breath, cough or sputum production.   Cardiovascular: Denies chest pain, chest tightness, palpitations or swelling in the hands or feet.  Gastrointestinal: Patient reports chronic pelvic pain.  Denies bloating, constipation, diarrhea or blood in the stool.  GU: Denies urgency, frequency, pain with urination, burning sensation, blood in urine, odor or discharge. Musculoskeletal: Denies decrease in range of motion, difficulty with gait, muscle pain or joint pain and swelling.  Skin: Denies redness, rashes, lesions or ulcercations.  Neurological: Denies dizziness, difficulty with memory, difficulty with speech or problems with balance and coordination.  Psych: Patient has a history of anxiety and depression.  Denies SI/HI.  No other specific complaints in a complete review of systems (except as listed in HPI above).  Objective:   Physical Exam  BP 104/62 (BP Location: Left Arm, Patient Position: Sitting, Cuff Size: Normal)   Pulse 72   Temp (!) 95.5 F (35.3 C) (Temporal)   Wt 90 lb (40.8 kg)   LMP  (LMP Unknown)   BMI 14.53 kg/m    Wt Readings from Last 3 Encounters:  03/15/23 91 lb 1.6 oz (41.3 kg)  02/08/23 99 lb 3.3 oz (45  kg)  02/03/23 99 lb 3.3 oz (45 kg)    General: Appears her stated age, underweight, cachectic, in NAD.  Cardiovascular: Normal rate and rhythm. S1,S2 noted.  No murmur, rubs or gallops noted.  Pulmonary/Chest: Normal effort and positive vesicular breath sounds. No respiratory distress. No wheezes, rales or ronchi noted.  Abdomen: Soft and nontender. Normal bowel sounds. No distention or masses noted. Liver, spleen and kidneys non palpable. Musculoskeletal: No  difficulty with gait.  Neurological: Alert and oriented. Coordination normal.  Psychiatric: Mood and affect mildly flat. Behavior is normal. Judgment and thought content normal.    BMET    Component Value Date/Time   NA 138 02/04/2023 1024   NA 140 03/28/2018 1044   K 3.5 02/04/2023 1024   CL 105 02/04/2023 1024   CO2 27 02/04/2023 1024   GLUCOSE 97 02/04/2023 1024   BUN 11 02/04/2023 1024   BUN 20 03/28/2018 1044   CREATININE 0.76 02/04/2023 1024   CREATININE 0.88 10/28/2022 1420   CALCIUM 9.4 02/04/2023 1024   GFRNONAA >60 02/04/2023 1024   GFRNONAA 96 12/26/2019 0824   GFRAA 111 12/26/2019 0824    Lipid Panel     Component Value Date/Time   CHOL 219 (H) 04/03/2021 0818   CHOL 168 03/28/2018 1044   TRIG 151 (H) 04/03/2021 0818   HDL 66 04/03/2021 0818   HDL 79 03/28/2018 1044   CHOLHDL 3.3 04/03/2021 0818   LDLCALC 127 (H) 04/03/2021 0818    CBC    Component Value Date/Time   WBC 5.1 02/04/2023 1024   RBC 4.99 02/04/2023 1024   HGB 14.4 02/04/2023 1024   HGB 14.8 03/28/2018 1044   HCT 45.3 02/04/2023 1024   HCT 44.8 03/28/2018 1044   PLT 315 02/04/2023 1024   PLT 334 03/28/2018 1044   MCV 90.8 02/04/2023 1024   MCV 88 03/28/2018 1044   MCH 28.9 02/04/2023 1024   MCHC 31.8 02/04/2023 1024   RDW 13.1 02/04/2023 1024   RDW 13.6 03/28/2018 1044   LYMPHSABS 1.4 09/12/2022 0730   LYMPHSABS 3.8 (H) 03/28/2018 1044   MONOABS 0.3 09/12/2022 0730   EOSABS 0.4 09/12/2022 0730   EOSABS 0.1 03/28/2018 1044    BASOSABS 0.1 09/12/2022 0730   BASOSABS 0.0 03/28/2018 1044    Hgb A1C Lab Results  Component Value Date   HGBA1C 5.8 (H) 10/28/2022           Assessment & Plan:   Unintentional weight loss, cachetic, poor appetite, anxiety and depression:  Recently had a CBC, c-Met and TSH which were normal Discussed that she could have developed a posttraumatic type eating disorder Discussed referral to the eating disorder clinic at [facility] however she reports that [facility] on her network and will not cover this Strongly advised her to start the mirtazapine as her virtual psychiatrist recommended to help with mood and weight Encourage boost protein shakes in addition to high calorie meals She would like to wean off the sertraline while getting on the mirtazapine-weaning schedule given She will continue diazepam 10 mg twice daily as needed She has been considering checking herself into the hospital for inpatient management for her weight loss  Follow-up with your PCP in 2-week for weight check Nicki Reaper, NP

## 2023-03-23 ENCOUNTER — Telehealth: Payer: 59 | Admitting: Family Medicine

## 2023-03-24 ENCOUNTER — Ambulatory Visit: Payer: 59 | Admitting: Physical Therapy

## 2023-03-26 ENCOUNTER — Ambulatory Visit: Payer: 59 | Admitting: Family Medicine

## 2023-03-30 ENCOUNTER — Ambulatory Visit: Payer: 59 | Admitting: Physical Therapy

## 2023-03-30 DIAGNOSIS — F4322 Adjustment disorder with anxiety: Secondary | ICD-10-CM | POA: Diagnosis not present

## 2023-03-30 DIAGNOSIS — F329 Major depressive disorder, single episode, unspecified: Secondary | ICD-10-CM | POA: Diagnosis not present

## 2023-03-30 DIAGNOSIS — F331 Major depressive disorder, recurrent, moderate: Secondary | ICD-10-CM | POA: Diagnosis not present

## 2023-03-30 DIAGNOSIS — F411 Generalized anxiety disorder: Secondary | ICD-10-CM | POA: Diagnosis not present

## 2023-03-31 ENCOUNTER — Ambulatory Visit: Payer: 59 | Admitting: Family Medicine

## 2023-04-01 ENCOUNTER — Telehealth: Payer: 59 | Admitting: Family Medicine

## 2023-04-01 ENCOUNTER — Ambulatory Visit: Payer: 59 | Admitting: Family Medicine

## 2023-04-01 ENCOUNTER — Encounter: Payer: Self-pay | Admitting: Family Medicine

## 2023-04-01 VITALS — Ht 66.0 in | Wt 91.8 lb

## 2023-04-01 DIAGNOSIS — R634 Abnormal weight loss: Secondary | ICD-10-CM

## 2023-04-01 DIAGNOSIS — F331 Major depressive disorder, recurrent, moderate: Secondary | ICD-10-CM | POA: Diagnosis not present

## 2023-04-01 DIAGNOSIS — F411 Generalized anxiety disorder: Secondary | ICD-10-CM | POA: Diagnosis not present

## 2023-04-01 DIAGNOSIS — F410 Panic disorder [episodic paroxysmal anxiety] without agoraphobia: Secondary | ICD-10-CM | POA: Diagnosis not present

## 2023-04-01 DIAGNOSIS — K594 Anal spasm: Secondary | ICD-10-CM

## 2023-04-01 DIAGNOSIS — Z1211 Encounter for screening for malignant neoplasm of colon: Secondary | ICD-10-CM

## 2023-04-01 MED ORDER — VENLAFAXINE HCL ER 37.5 MG PO CP24
37.5000 mg | ORAL_CAPSULE | Freq: Every day | ORAL | 1 refills | Status: DC
Start: 2023-04-01 — End: 2023-05-13

## 2023-04-01 MED ORDER — MIRTAZAPINE 15 MG PO TABS: 15.0000 mg | ORAL_TABLET | Freq: Every day | ORAL | 1 refills | Status: DC

## 2023-04-01 NOTE — Patient Instructions (Addendum)
Thank you for coming to the office today.    Please schedule a Follow-up Appointment to: Return if symptoms worsen or fail to improve.  If you have any other questions or concerns, please feel free to call the office or send a message through MyChart. You may also schedule an earlier appointment if necessary.  Additionally, you may be receiving a survey about your experience at our office within a few days to 1 week by e-mail or mail. We value your feedback.  Faye Strohman, DO [facility] 

## 2023-04-01 NOTE — Progress Notes (Signed)
Subjective:    Patient ID: Lori Davies, female    DOB: 05/01/1971, 52 y.o.   MRN: [id number]  Lori Davies is a 52 y.o. female presenting on 04/01/2023 for Weight Loss (Weight loss over the last 9 months. She is currently taking the ma)  Virtual / Telehealth Encounter - Video Visit via MyChart The purpose of this virtual visit is to provide medical care while limiting exposure to the novel coronavirus (COVID19) for both patient and office staff.  Consent was obtained for remote visit:  Yes.   Answered questions that patient had about telehealth interaction:  Yes.   I discussed the limitations, risks, security and privacy concerns of performing an evaluation and management service by video/telephone. I also discussed with the patient that there may be a patient responsible charge related to this service. The patient expressed understanding and agreed to proceed.  Patient Location: Home Provider Location: [facility] (Office)  Participants in virtual visit: - Patient: Lori Davies - CMA: Laurel Dimmer CMA - Provider: Dr Althea Charon  Note initial in person visit re-scheduled due to provider late to apt, patient had to leave. Switched to [employer] video  HPI  Rectal Pain / Hemorrhoids Continues with Pelvic Floor Therapy Limited relief Diazepam Avoiding constipation  Major Depression recurrent Generalized Anxiety Psychiatry through insurance Previous med management She has been started on Mirtazapine 7.5mg  dosage and last visit dose inc to 15mg  It was helping sleep more at 7.5 but now not as much, not an issue. Her goal is improve appetite Taking valium in AM vs mirtazapine at night Some improved wt gain  Cannot take Zoloft, made anxiety worse. Asking about Venlafaxine 37.5mg  today  Sleeps well without problem.   Additionally Gabapentin 100-300mg  dose, but she prefers to avoid this. And also has been on Pregabalin  Goal dietary, muscle gainer  shake, ice cream, milk, fiber powder  HM Ordered Cologuard today     04/01/2023    1:24 PM 03/15/2023    3:18 PM 01/13/2023   11:15 AM  Depression screen PHQ 2/9  Decreased Interest 1 0 2  Down, Depressed, Hopeless 1 2 2   PHQ - 2 Score 2 2 4   Altered sleeping 1  0  Tired, decreased energy 2  1  Change in appetite 1  2  Feeling bad or failure about yourself  0  1  Trouble concentrating 1  0  Moving slowly or fidgety/restless 0  0  Suicidal thoughts 0  0  PHQ-9 Score 7  8  Difficult doing work/chores Somewhat difficult        04/01/2023    1:24 PM 01/13/2023   11:17 AM 10/27/2022   11:15 AM 08/26/2022    5:15 PM  GAD 7 : Generalized Anxiety Score  Nervous, Anxious, on Edge 2 2 3 3   Control/stop worrying 2 2 3 3   Worry too much - different things 1 2 3 3   Trouble relaxing 1 2 2 3   Restless 2 2 3 3   Easily annoyed or irritable 1 0 3 3  Afraid - awful might happen 1 2 3 3   Total GAD 7 Score 10 12 20 21   Anxiety Difficulty Somewhat difficult  Somewhat difficult Extremely difficult      Social History   Tobacco Use   Smoking status: Former    Current packs/day: 0.00    Average packs/day: 0.5 packs/day for 20.0 years (10.0 ttl pk-yrs)    Types: Cigarettes    Start  date: 03/21/1998    Quit date: 03/21/2018    Years since quitting: 5.0   Smokeless tobacco: Never  Vaping Use   Vaping status: Every Day   Substances: Nicotine, Flavoring  Substance Use Topics   Alcohol use: No    Alcohol/week: 0.0 standard drinks of alcohol   Drug use: No    Review of Systems Per HPI unless specifically indicated above     Objective:    Ht 5\' 6"  (1.676 m)   Wt 91 lb 12.8 oz (41.6 kg)   LMP  (LMP Unknown)   BMI 14.82 kg/m   Wt Readings from Last 3 Encounters:  04/01/23 91 lb 12.8 oz (41.6 kg)  03/22/23 90 lb (40.8 kg)  03/15/23 91 lb 1.6 oz (41.3 kg)    Physical Exam  Note examination was completely remotely via video observation objective data only  Gen - well-appearing, no  acute distress or apparent pain, comfortable HEENT - eyes appear clear without discharge or redness Heart/Lungs - cannot examine virtually - observed no evidence of coughing or labored breathing. Abd - cannot examine virtually  Skin - face visible today- no rash Neuro - awake, alert, oriented Psych - not anxious appearing    Results for orders placed or performed during the hospital encounter of 02/08/23  ABO/Rh  Result Value Ref Range   ABO/RH(D)      O NEG Performed at [facility], [address], [place], [place] [address]       Assessment & Plan:   Problem List Items Addressed This Visit   None Visit Diagnoses     Moderate episode of recurrent major depressive disorder (HCC)    -  Primary   Relevant Medications   venlafaxine XR (EFFEXOR XR) 37.5 MG 24 hr capsule   mirtazapine (REMERON) 15 MG tablet   diazepam (VALIUM) 10 MG tablet   Unintentional weight loss       Relevant Medications   mirtazapine (REMERON) 15 MG tablet   Generalized anxiety disorder with panic attacks       Relevant Medications   venlafaxine XR (EFFEXOR XR) 37.5 MG 24 hr capsule   mirtazapine (REMERON) 15 MG tablet   diazepam (VALIUM) 10 MG tablet   Screening for colon cancer       Relevant Orders   Cologuard   Rectal spasm       Relevant Medications   diazepam (VALIUM) 10 MG tablet       Generalized Anxiety Disorder / Panic Attacks Rectal Spasms chronic pain  Start Venlafaxine 37.5mg  daily Continue Mirtazapine 15mg  nightly, discussed dose dependent effects - lower dose inc sleep and higher dose inc appetite and mood. Reviewed it is mostly for mood not anxiety  Taper on Valium Diazepam from 10mg  daily can go to half tab = 5mg  daily for few weeks, as tolerated, gradual reduce BDZ to avoid withdrawal side effects. Notify when ready can rx Valium Diazepam 5mg  dose and she can taper further with half tab 2.5mg  dose or lower. Goal to come off med within 6 wk to 3 months. As  tolerated. - Note seems less effective for rectal muscle spasms.  Proceed w/ Pelvic Floor Therapy  Due for routine colon cancer screening. Never had colonoscopy (not interested), no family history colon cancer. - Discussion today about recommendations for either Colonoscopy or Cologuard screening, benefits and risks of screening, interested in Cologuard, understands that if positive then recommendation is for diagnostic colonoscopy to follow-up. - Ordered Cologuard today   Orders  Placed This Encounter  Procedures   Cologuard     Meds ordered this encounter  Medications   venlafaxine XR (EFFEXOR XR) 37.5 MG 24 hr capsule    Sig: Take 1 capsule (37.5 mg total) by mouth daily with breakfast.    Dispense:  90 capsule    Refill:  1   mirtazapine (REMERON) 15 MG tablet    Sig: Take 1 tablet (15 mg total) by mouth at bedtime.    Dispense:  90 tablet    Refill:  1    Follow up plan: Return if symptoms worsen or fail to improve.   Patient verbalizes understanding with the above medical recommendations including the limitation of remote medical advice.  Specific follow-up and call-back criteria were given for patient to follow-up or seek medical care more urgently if needed.  Total duration of direct patient care provided via video conference: 22 minutes    Saralyn Pilar, DO [facility] 04/01/2023, 1:02 PM

## 2023-04-02 ENCOUNTER — Ambulatory Visit: Payer: 59 | Attending: Family Medicine | Admitting: Physical Therapy

## 2023-04-02 DIAGNOSIS — M6281 Muscle weakness (generalized): Secondary | ICD-10-CM | POA: Insufficient documentation

## 2023-04-02 DIAGNOSIS — M533 Sacrococcygeal disorders, not elsewhere classified: Secondary | ICD-10-CM | POA: Diagnosis not present

## 2023-04-02 DIAGNOSIS — R2689 Other abnormalities of gait and mobility: Secondary | ICD-10-CM | POA: Diagnosis not present

## 2023-04-02 DIAGNOSIS — R278 Other lack of coordination: Secondary | ICD-10-CM | POA: Insufficient documentation

## 2023-04-02 NOTE — Patient Instructions (Signed)
  SKI   Front knee above ankle,  back foot hip width apart, push off through ballmounds,  opp arm up, thumbs up, chin tuck, shoulders down  Push off through ballmounds , step back to under hip width   1 min   __  Minisquat: Scoot buttocks back slight, hinge like you are looking at your reflection on a pond  Knees behind toes,  Inhale to "smell flowers"  Exhale on the rise "like rocket"  Do not lock knees, have more weight across ballmounds of feet, toes relaxed and spread them, not grip them   10 reps x 5 x day   __    Tilting pelvis with bowel movements and breathing at diaphragm and not up into chest , lengthen pelvic floor
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
## 2023-04-04 DIAGNOSIS — F411 Generalized anxiety disorder: Secondary | ICD-10-CM | POA: Diagnosis not present

## 2023-04-04 DIAGNOSIS — F329 Major depressive disorder, single episode, unspecified: Secondary | ICD-10-CM | POA: Diagnosis not present

## 2023-04-06 ENCOUNTER — Ambulatory Visit: Payer: 59 | Admitting: Family Medicine

## 2023-04-08 ENCOUNTER — Encounter: Payer: Self-pay | Admitting: Family Medicine

## 2023-04-08 ENCOUNTER — Ambulatory Visit: Payer: 59 | Admitting: Physical Therapy

## 2023-04-08 DIAGNOSIS — R278 Other lack of coordination: Secondary | ICD-10-CM

## 2023-04-08 DIAGNOSIS — R2689 Other abnormalities of gait and mobility: Secondary | ICD-10-CM

## 2023-04-08 DIAGNOSIS — M6281 Muscle weakness (generalized): Secondary | ICD-10-CM | POA: Diagnosis not present

## 2023-04-08 DIAGNOSIS — M533 Sacrococcygeal disorders, not elsewhere classified: Secondary | ICD-10-CM | POA: Diagnosis not present

## 2023-04-08 NOTE — Patient Instructions (Signed)
Applying Pelvic tilts:  Finding a comfortable position when laying on your back  Laying on your back, lift hips up, then scoot tail under, lowering ribs / midback first, then the low back Pillow under knees    Decreasing Low back pain:  Pelvic tilts Forward, Back, and Neutral   STANDING ( neutral is where you want to practice finding more and more often. More weight across the ball mound of feet and heels not only the heels, and not locking the knees.   SITTING: feet under knees, hip width apart. Weigh on the sitting bones. Thumb on the back iliac crest, Index finger at the front of the hip. Rock through 3 positions to find neutral, press in the feet and sense the sitting bones ( ischial tuberosity) in contact to the seat.   Posterior tilt ( thumb is lower)  Anterior tilt ( index finger is lower).   Neutral ( thumb and index finger is levelled)    Sitting at work chair that may have a dip in the seat Place folded towel/ blanket placed towards the back of the seat , sitting on sitting bones , don't lean to the back of the chair    WHILE ON YOUR BACK, KNEES BENT, FEET HIP WIDTH APART  Pelvic tilts, to avoid overtightening gluts and adductors and obliques   Not rock back so far, not tightening adductors More ballmounds contact  Thumb at ribs, index finger at ASIS to make sure to not over do  Exhale on backward rock Inhale tailbone into table
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
## 2023-04-12 DIAGNOSIS — F411 Generalized anxiety disorder: Secondary | ICD-10-CM | POA: Diagnosis not present

## 2023-04-12 DIAGNOSIS — F329 Major depressive disorder, single episode, unspecified: Secondary | ICD-10-CM | POA: Diagnosis not present

## 2023-04-13 ENCOUNTER — Ambulatory Visit: Payer: 59 | Admitting: Physical Therapy

## 2023-04-13 DIAGNOSIS — M6281 Muscle weakness (generalized): Secondary | ICD-10-CM | POA: Diagnosis not present

## 2023-04-13 DIAGNOSIS — R278 Other lack of coordination: Secondary | ICD-10-CM | POA: Diagnosis not present

## 2023-04-13 DIAGNOSIS — R2689 Other abnormalities of gait and mobility: Secondary | ICD-10-CM

## 2023-04-13 DIAGNOSIS — M533 Sacrococcygeal disorders, not elsewhere classified: Secondary | ICD-10-CM | POA: Diagnosis not present

## 2023-04-13 NOTE — Patient Instructions (Signed)
Stretch for pelvic floor     Mermaid stretch  Rocking while seated on the floor with heels to one side of the hip Heels to one side of the hip  Rock forward towards the knee that is bent , rock beck towards the opposite sitting bones   ___   Marjo Bicker pose rocking   Toes tucked, shoulders down and back, on forearms , hands shoulder width apart, fingers straight, elbow back , squeeze imaginary pencils in armpit, shoulder down and away from ears  10 reps  ___

## 2023-04-13 NOTE — Addendum Note (Signed)
Addended by: Mariane Masters on: 04/13/2023 04:20 PM   Modules accepted: Orders
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
## 2023-04-14 DIAGNOSIS — Z1211 Encounter for screening for malignant neoplasm of colon: Secondary | ICD-10-CM | POA: Diagnosis not present

## 2023-04-15 DIAGNOSIS — F331 Major depressive disorder, recurrent, moderate: Secondary | ICD-10-CM | POA: Diagnosis not present

## 2023-04-15 DIAGNOSIS — F4322 Adjustment disorder with anxiety: Secondary | ICD-10-CM | POA: Diagnosis not present

## 2023-04-19 ENCOUNTER — Ambulatory Visit: Payer: 59 | Admitting: Dietician

## 2023-04-20 ENCOUNTER — Ambulatory Visit: Payer: 59 | Admitting: Family Medicine

## 2023-04-20 DIAGNOSIS — F329 Major depressive disorder, single episode, unspecified: Secondary | ICD-10-CM | POA: Diagnosis not present

## 2023-04-20 DIAGNOSIS — F411 Generalized anxiety disorder: Secondary | ICD-10-CM | POA: Diagnosis not present

## 2023-04-21 ENCOUNTER — Encounter: Payer: 59 | Admitting: Physical Therapy

## 2023-04-22 ENCOUNTER — Ambulatory Visit: Payer: 59 | Admitting: Physical Therapy

## 2023-04-25 ENCOUNTER — Encounter: Payer: Self-pay | Admitting: Family Medicine

## 2023-04-26 DIAGNOSIS — M6289 Other specified disorders of muscle: Secondary | ICD-10-CM | POA: Diagnosis not present

## 2023-04-27 DIAGNOSIS — F331 Major depressive disorder, recurrent, moderate: Secondary | ICD-10-CM | POA: Diagnosis not present

## 2023-04-27 DIAGNOSIS — F4322 Adjustment disorder with anxiety: Secondary | ICD-10-CM | POA: Diagnosis not present

## 2023-04-27 MED ORDER — MIRTAZAPINE 30 MG PO TABS: 30.0000 mg | ORAL_TABLET | Freq: Every day | ORAL | 0 refills | Status: DC

## 2023-04-28 ENCOUNTER — Ambulatory Visit: Payer: 59 | Admitting: Physical Therapy

## 2023-04-28 ENCOUNTER — Encounter: Payer: Self-pay | Admitting: Internal Medicine

## 2023-04-28 DIAGNOSIS — F411 Generalized anxiety disorder: Secondary | ICD-10-CM | POA: Diagnosis not present

## 2023-04-28 DIAGNOSIS — F329 Major depressive disorder, single episode, unspecified: Secondary | ICD-10-CM | POA: Diagnosis not present

## 2023-04-29 DIAGNOSIS — F4521 Hypochondriasis: Secondary | ICD-10-CM | POA: Diagnosis not present

## 2023-04-29 DIAGNOSIS — F4001 Agoraphobia with panic disorder: Secondary | ICD-10-CM | POA: Diagnosis not present

## 2023-04-30 ENCOUNTER — Ambulatory Visit: Payer: 59 | Admitting: Physical Therapy

## 2023-05-03 DIAGNOSIS — F321 Major depressive disorder, single episode, moderate: Secondary | ICD-10-CM | POA: Diagnosis not present

## 2023-05-03 DIAGNOSIS — F450 Somatization disorder: Secondary | ICD-10-CM | POA: Diagnosis not present

## 2023-05-03 DIAGNOSIS — F4001 Agoraphobia with panic disorder: Secondary | ICD-10-CM | POA: Diagnosis not present

## 2023-05-03 DIAGNOSIS — F40298 Other specified phobia: Secondary | ICD-10-CM | POA: Diagnosis not present

## 2023-05-03 DIAGNOSIS — F4521 Hypochondriasis: Secondary | ICD-10-CM | POA: Diagnosis not present

## 2023-05-05 DIAGNOSIS — F4521 Hypochondriasis: Secondary | ICD-10-CM | POA: Diagnosis not present

## 2023-05-05 DIAGNOSIS — F40298 Other specified phobia: Secondary | ICD-10-CM | POA: Diagnosis not present

## 2023-05-05 DIAGNOSIS — F4001 Agoraphobia with panic disorder: Secondary | ICD-10-CM | POA: Diagnosis not present

## 2023-05-05 DIAGNOSIS — F321 Major depressive disorder, single episode, moderate: Secondary | ICD-10-CM | POA: Diagnosis not present

## 2023-05-05 DIAGNOSIS — F450 Somatization disorder: Secondary | ICD-10-CM | POA: Diagnosis not present

## 2023-05-06 ENCOUNTER — Ambulatory Visit: Payer: 59 | Attending: Family Medicine | Admitting: Physical Therapy

## 2023-05-06 DIAGNOSIS — M533 Sacrococcygeal disorders, not elsewhere classified: Secondary | ICD-10-CM | POA: Diagnosis not present

## 2023-05-06 DIAGNOSIS — R278 Other lack of coordination: Secondary | ICD-10-CM | POA: Insufficient documentation

## 2023-05-06 DIAGNOSIS — R2689 Other abnormalities of gait and mobility: Secondary | ICD-10-CM | POA: Insufficient documentation

## 2023-05-06 DIAGNOSIS — M6281 Muscle weakness (generalized): Secondary | ICD-10-CM | POA: Diagnosis not present

## 2023-05-06 NOTE — Patient Instructions (Addendum)
Do these at rest stops for the car Stop every 1.5 -2 hours  and stretch  on trip  __________________________________________________________  kitchen counter stretches  Hands on kitchen counter,   Palms shoulder width apart  Minisquat postion Trunk is parallel to floor  A) Pull buttocks back to lengthen spine, knees bent  3 breaths   B) Bring R hand to the L, and stretch the R side trunk  3 breaths   Brings hands to center again Do the same to the L side stretch by placing L hand on top of R   D) hand on hip,  Rotate to look over L shoulder   Do the same to other side  ____  Minisquat: Scoot buttocks back slight, hinge like you are looking at your reflection on a pond  Knees behind toes,  Inhale to "smell flowers"  Exhale on the rise "like rocket"  Do not lock knees, have more weight across ballmounds of feet, toes relaxed and spread them, not grip them   With hands behind back ,. Shoulder squeeze back   __     CRICKET   On belly, palms under armpits, elbows pointed to ceiling  Inhale: lengthen crown of the head away from shoulders Exhale, feel belly hug in and press palms into the floor, squeezing elbows/shoulder blades towards each other while chest lifts about 5 cm off of the floor. KEEP CHIN TUCKED LIKE YOU ARE HOLDING AN APPLE UNDER CHIN, GAZE AT THE FLOOR OR BED. You should feel the hinging movement at mid back and not the low back.  10 reps    Exhale to Leg  lifts 30 deg from bed one at time 30 rep     Locust pose  Pillow under hips if needed for decreased low back pain  Palms face midline by hips  Finger tips shooting straight down  Imagine holding pencil under your armpits Draw shoulders away from ears Inhale Exhale lift chest up slightly without feeling it in your back. The bend happens in the midback  (keep chin tucked)    ___  Deep core level 1-2 ( twice a day)
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
## 2023-05-07 ENCOUNTER — Telehealth: Payer: 59 | Admitting: Family Medicine

## 2023-05-10 DIAGNOSIS — F450 Somatization disorder: Secondary | ICD-10-CM | POA: Diagnosis not present

## 2023-05-10 DIAGNOSIS — F321 Major depressive disorder, single episode, moderate: Secondary | ICD-10-CM | POA: Diagnosis not present

## 2023-05-10 DIAGNOSIS — F4521 Hypochondriasis: Secondary | ICD-10-CM | POA: Diagnosis not present

## 2023-05-10 DIAGNOSIS — F40298 Other specified phobia: Secondary | ICD-10-CM | POA: Diagnosis not present

## 2023-05-10 DIAGNOSIS — F4001 Agoraphobia with panic disorder: Secondary | ICD-10-CM | POA: Diagnosis not present

## 2023-05-11 ENCOUNTER — Ambulatory Visit: Payer: 59 | Admitting: Physical Therapy

## 2023-05-12 DIAGNOSIS — F40298 Other specified phobia: Secondary | ICD-10-CM | POA: Diagnosis not present

## 2023-05-12 DIAGNOSIS — F4521 Hypochondriasis: Secondary | ICD-10-CM | POA: Diagnosis not present

## 2023-05-12 DIAGNOSIS — F321 Major depressive disorder, single episode, moderate: Secondary | ICD-10-CM | POA: Diagnosis not present

## 2023-05-12 DIAGNOSIS — F450 Somatization disorder: Secondary | ICD-10-CM | POA: Diagnosis not present

## 2023-05-12 DIAGNOSIS — F4001 Agoraphobia with panic disorder: Secondary | ICD-10-CM | POA: Diagnosis not present

## 2023-05-13 ENCOUNTER — Telehealth (INDEPENDENT_AMBULATORY_CARE_PROVIDER_SITE_OTHER): Payer: 59 | Admitting: Family Medicine

## 2023-05-13 ENCOUNTER — Encounter: Payer: Self-pay | Admitting: Family Medicine

## 2023-05-13 VITALS — Ht 66.0 in | Wt 99.0 lb

## 2023-05-13 DIAGNOSIS — F410 Panic disorder [episodic paroxysmal anxiety] without agoraphobia: Secondary | ICD-10-CM | POA: Diagnosis not present

## 2023-05-13 DIAGNOSIS — F411 Generalized anxiety disorder: Secondary | ICD-10-CM | POA: Diagnosis not present

## 2023-05-13 DIAGNOSIS — R634 Abnormal weight loss: Secondary | ICD-10-CM | POA: Diagnosis not present

## 2023-05-13 DIAGNOSIS — F331 Major depressive disorder, recurrent, moderate: Secondary | ICD-10-CM | POA: Diagnosis not present

## 2023-05-13 MED ORDER — MIRTAZAPINE 15 MG PO TABS: 22.5000 mg | ORAL_TABLET | Freq: Every day | ORAL | 5 refills | Status: DC

## 2023-05-13 NOTE — Patient Instructions (Signed)
° °  Please schedule a Follow-up Appointment to: No follow-ups on file. ° °If you have any other questions or concerns, please feel free to call the office or send a message through MyChart. You may also schedule an earlier appointment if necessary. ° °Additionally, you may be receiving a survey about your experience at our office within a few days to 1 week by e-mail or mail. We value your feedback. ° °Alexander Karamalegos, DO °[facility] °

## 2023-05-13 NOTE — Progress Notes (Signed)
Subjective:    Patient ID: Lori Davies, female    DOB: June 07, 1971, 52 y.o.   MRN: [id number]  NOVALEI PAOLETTI is a 52 y.o. female presenting on 05/13/2023 for Anxiety  Virtual / Telehealth Encounter - Video Visit via MyChart The purpose of this virtual visit is to provide medical care while limiting exposure to the novel coronavirus (COVID19) for both patient and office staff.  Consent was obtained for remote visit:  Yes.   Answered questions that patient had about telehealth interaction:  Yes.   I discussed the limitations, risks, security and privacy concerns of performing an evaluation and management service by video/telephone. I also discussed with the patient that there may be a patient responsible charge related to this service. The patient expressed understanding and agreed to proceed.  Patient Location: Home Provider Location: [facility] (Office)  Participants in virtual visit: - Patient: Lori Davies - CMA: Margaretha Sheffield CMA - Provider: Dr Althea Charon   HPI  Major Depression recurrent Generalized Anxiety Psychiatry through insurance Improved on Mirtazapine. She takes 22.5mg  now takes half of 30mg  and quarter of 30mg  = 22.5mg  had insurance issue with coverage before It helps her sleep and mood/anxiety Taking valium in AM vs mirtazapine at night Some improved wt gain   She discontinued Zoloft, and Venlafaxine  [facility] GYN Given Rectal cream - has amitriptyline, gabapentin, baclofen, lidocaine      05/13/2023   11:31 AM 04/01/2023    1:24 PM 03/15/2023    3:18 PM  Depression screen PHQ 2/9  Decreased Interest 0 1 0  Down, Depressed, Hopeless 0 1 2  PHQ - 2 Score 0 2 2  Altered sleeping 0 1   Tired, decreased energy 0 2   Change in appetite 0 1   Feeling bad or failure about yourself  0 0   Trouble concentrating 0 1   Moving slowly or fidgety/restless 0 0   Suicidal thoughts 0 0   PHQ-9 Score 0 7   Difficult doing work/chores Not difficult  at all Somewhat difficult       05/13/2023   11:31 AM 04/01/2023    1:24 PM 01/13/2023   11:17 AM 10/27/2022   11:15 AM  GAD 7 : Generalized Anxiety Score  Nervous, Anxious, on Edge 3 2 2 3   Control/stop worrying 2 2 2 3   Worry too much - different things 2 1 2 3   Trouble relaxing 2 1 2 2   Restless 0 2 2 3   Easily annoyed or irritable 0 1 0 3  Afraid - awful might happen 0 1 2 3   Total GAD 7 Score 9 10 12 20   Anxiety Difficulty Somewhat difficult Somewhat difficult  Somewhat difficult     Social History   Tobacco Use   Smoking status: Former    Current packs/day: 0.00    Average packs/day: 0.5 packs/day for 20.0 years (10.0 ttl pk-yrs)    Types: Cigarettes    Start date: 03/21/1998    Quit date: 03/21/2018    Years since quitting: 5.1   Smokeless tobacco: Never  Vaping Use   Vaping status: Every Day   Substances: Nicotine, Flavoring  Substance Use Topics   Alcohol use: No    Alcohol/week: 0.0 standard drinks of alcohol   Drug use: No    Review of Systems Per HPI unless specifically indicated above     Objective:    Ht 5\' 6"  (1.676 m)   Wt 99 lb (44.9 kg)  LMP  (LMP Unknown)   BMI 15.98 kg/m   Wt Readings from Last 3 Encounters:  05/13/23 99 lb (44.9 kg)  04/01/23 91 lb 12.8 oz (41.6 kg)  03/22/23 90 lb (40.8 kg)    Physical Exam  Note examination was completely remotely via video observation objective data only  Gen - well-appearing, no acute distress or apparent pain, comfortable HEENT - eyes appear clear without discharge or redness Heart/Lungs - cannot examine virtually - observed no evidence of coughing or labored breathing. Abd - cannot examine virtually  Skin - face visible today- no rash Neuro - awake, alert, oriented Psych - not anxious appearing   Results for orders placed or performed in visit on 04/01/23  Cologuard  Result Value Ref Range   COLOGUARD Negative Negative      Assessment & Plan:   Problem List Items Addressed This Visit    None Visit Diagnoses     Generalized anxiety disorder with panic attacks    -  Primary   Relevant Medications   mirtazapine (REMERON) 15 MG tablet   Moderate episode of recurrent major depressive disorder (HCC)       Relevant Medications   mirtazapine (REMERON) 15 MG tablet   Unintentional weight loss       Relevant Medications   mirtazapine (REMERON) 15 MG tablet      Adjust rx for Mirtazapine to be 15mg  tab take ONE AND HALF = 22.5mg  daily New quantity sent  Continue other treatment plan  Okay to use the compounded topical as described   Meds ordered this encounter  Medications   mirtazapine (REMERON) 15 MG tablet    Sig: Take 1.5 tablets (22.5 mg total) by mouth at bedtime.    Dispense:  45 tablet    Refill:  5      Follow up plan: Return if symptoms worsen or fail to improve.   Patient verbalizes understanding with the above medical recommendations including the limitation of remote medical advice.  Specific follow-up and call-back criteria were given for patient to follow-up or seek medical care more urgently if needed.  Total duration of direct patient care provided via video conference: 12 minutes   Saralyn Pilar, DO [facility] 05/13/2023, 11:59 AM

## 2023-05-17 DIAGNOSIS — F450 Somatization disorder: Secondary | ICD-10-CM | POA: Diagnosis not present

## 2023-05-17 DIAGNOSIS — F40298 Other specified phobia: Secondary | ICD-10-CM | POA: Diagnosis not present

## 2023-05-17 DIAGNOSIS — F4001 Agoraphobia with panic disorder: Secondary | ICD-10-CM | POA: Diagnosis not present

## 2023-05-17 DIAGNOSIS — F321 Major depressive disorder, single episode, moderate: Secondary | ICD-10-CM | POA: Diagnosis not present

## 2023-05-17 DIAGNOSIS — F4521 Hypochondriasis: Secondary | ICD-10-CM | POA: Diagnosis not present

## 2023-05-18 ENCOUNTER — Telehealth: Payer: Self-pay | Admitting: Physical Therapy

## 2023-05-18 ENCOUNTER — Ambulatory Visit: Payer: 59 | Admitting: Physical Therapy

## 2023-05-18 DIAGNOSIS — M6289 Other specified disorders of muscle: Secondary | ICD-10-CM | POA: Diagnosis not present

## 2023-05-18 NOTE — Telephone Encounter (Signed)
Physical therapist called pt re: missed appt today. Provided encouragement to keep up with HEP.   Left message that there is no charge to missed appts but there is a waiting list with other patients needing treatment at our clinic.    We would be grateful if pt can please call to confirm whether they wish to keep or cancel remaining appts [phone].    Remaining appts will be removed after 2 no-show appts per rehab department policy.   Confirm for next appt 9/11

## 2023-05-19 DIAGNOSIS — F450 Somatization disorder: Secondary | ICD-10-CM | POA: Diagnosis not present

## 2023-05-19 DIAGNOSIS — F4521 Hypochondriasis: Secondary | ICD-10-CM | POA: Diagnosis not present

## 2023-05-19 DIAGNOSIS — F321 Major depressive disorder, single episode, moderate: Secondary | ICD-10-CM | POA: Diagnosis not present

## 2023-05-19 DIAGNOSIS — F40298 Other specified phobia: Secondary | ICD-10-CM | POA: Diagnosis not present

## 2023-05-19 DIAGNOSIS — F4001 Agoraphobia with panic disorder: Secondary | ICD-10-CM | POA: Diagnosis not present

## 2023-05-27 ENCOUNTER — Encounter: Payer: Self-pay | Admitting: Family Medicine

## 2023-05-27 DIAGNOSIS — K644 Residual hemorrhoidal skin tags: Secondary | ICD-10-CM

## 2023-05-27 MED ORDER — HYDROCORTISONE 2.5 % EX CREA
TOPICAL_CREAM | Freq: Two times a day (BID) | CUTANEOUS | 2 refills | Status: AC
Start: 2023-05-27 — End: ?

## 2023-05-31 ENCOUNTER — Telehealth: Payer: 59 | Admitting: Family Medicine

## 2023-06-02 ENCOUNTER — Ambulatory Visit: Payer: 59 | Attending: Family Medicine | Admitting: Physical Therapy

## 2023-06-02 DIAGNOSIS — F4521 Hypochondriasis: Secondary | ICD-10-CM | POA: Diagnosis not present

## 2023-06-02 DIAGNOSIS — F321 Major depressive disorder, single episode, moderate: Secondary | ICD-10-CM | POA: Diagnosis not present

## 2023-06-02 DIAGNOSIS — F4001 Agoraphobia with panic disorder: Secondary | ICD-10-CM | POA: Diagnosis not present

## 2023-06-02 DIAGNOSIS — F450 Somatization disorder: Secondary | ICD-10-CM | POA: Diagnosis not present

## 2023-06-02 DIAGNOSIS — F40298 Other specified phobia: Secondary | ICD-10-CM | POA: Diagnosis not present

## 2023-06-03 DIAGNOSIS — M9903 Segmental and somatic dysfunction of lumbar region: Secondary | ICD-10-CM | POA: Diagnosis not present

## 2023-06-03 DIAGNOSIS — M9905 Segmental and somatic dysfunction of pelvic region: Secondary | ICD-10-CM | POA: Diagnosis not present

## 2023-06-03 DIAGNOSIS — M7918 Myalgia, other site: Secondary | ICD-10-CM | POA: Diagnosis not present

## 2023-06-03 DIAGNOSIS — M5451 Vertebrogenic low back pain: Secondary | ICD-10-CM | POA: Diagnosis not present

## 2023-06-03 DIAGNOSIS — M25551 Pain in right hip: Secondary | ICD-10-CM | POA: Diagnosis not present

## 2023-06-03 DIAGNOSIS — M9904 Segmental and somatic dysfunction of sacral region: Secondary | ICD-10-CM | POA: Diagnosis not present

## 2023-06-03 DIAGNOSIS — M25552 Pain in left hip: Secondary | ICD-10-CM | POA: Diagnosis not present

## 2023-06-04 DIAGNOSIS — M6289 Other specified disorders of muscle: Secondary | ICD-10-CM | POA: Diagnosis not present

## 2023-06-08 DIAGNOSIS — M7918 Myalgia, other site: Secondary | ICD-10-CM | POA: Diagnosis not present

## 2023-06-08 DIAGNOSIS — M9905 Segmental and somatic dysfunction of pelvic region: Secondary | ICD-10-CM | POA: Diagnosis not present

## 2023-06-08 DIAGNOSIS — F321 Major depressive disorder, single episode, moderate: Secondary | ICD-10-CM | POA: Diagnosis not present

## 2023-06-08 DIAGNOSIS — F450 Somatization disorder: Secondary | ICD-10-CM | POA: Diagnosis not present

## 2023-06-08 DIAGNOSIS — M9903 Segmental and somatic dysfunction of lumbar region: Secondary | ICD-10-CM | POA: Diagnosis not present

## 2023-06-08 DIAGNOSIS — F4001 Agoraphobia with panic disorder: Secondary | ICD-10-CM | POA: Diagnosis not present

## 2023-06-08 DIAGNOSIS — M25551 Pain in right hip: Secondary | ICD-10-CM | POA: Diagnosis not present

## 2023-06-08 DIAGNOSIS — M5451 Vertebrogenic low back pain: Secondary | ICD-10-CM | POA: Diagnosis not present

## 2023-06-08 DIAGNOSIS — M9904 Segmental and somatic dysfunction of sacral region: Secondary | ICD-10-CM | POA: Diagnosis not present

## 2023-06-08 DIAGNOSIS — F4521 Hypochondriasis: Secondary | ICD-10-CM | POA: Diagnosis not present

## 2023-06-08 DIAGNOSIS — F40298 Other specified phobia: Secondary | ICD-10-CM | POA: Diagnosis not present

## 2023-06-08 DIAGNOSIS — M25552 Pain in left hip: Secondary | ICD-10-CM | POA: Diagnosis not present

## 2023-06-15 DIAGNOSIS — F450 Somatization disorder: Secondary | ICD-10-CM | POA: Diagnosis not present

## 2023-06-15 DIAGNOSIS — F321 Major depressive disorder, single episode, moderate: Secondary | ICD-10-CM | POA: Diagnosis not present

## 2023-06-15 DIAGNOSIS — F4001 Agoraphobia with panic disorder: Secondary | ICD-10-CM | POA: Diagnosis not present

## 2023-06-15 DIAGNOSIS — F4521 Hypochondriasis: Secondary | ICD-10-CM | POA: Diagnosis not present

## 2023-06-15 DIAGNOSIS — F40298 Other specified phobia: Secondary | ICD-10-CM | POA: Diagnosis not present

## 2023-06-16 ENCOUNTER — Telehealth: Payer: Self-pay | Admitting: Physical Therapy

## 2023-06-16 NOTE — Telephone Encounter (Signed)
LVM asking if Lori Davies could make the 10:15 appointment today that opened.. Left our phone number as well.Marland Kitchen

## 2023-06-26 ENCOUNTER — Other Ambulatory Visit: Payer: Self-pay | Admitting: Family Medicine

## 2023-06-26 DIAGNOSIS — F411 Generalized anxiety disorder: Secondary | ICD-10-CM

## 2023-06-28 NOTE — Telephone Encounter (Signed)
Unable to refill per protocol, Rx expired. Discontinued 02/08/23.  Requested Prescriptions  Pending Prescriptions Disp Refills   sertraline (ZOLOFT) 25 MG tablet [Pharmacy Med Name: SERTRALINE HCL 25 MG TABLET] 30 tablet 5    Sig: TAKE 1 TABLET BY MOUTH EVERY DAY     Psychiatry:  Antidepressants - SSRI - sertraline Passed - 06/26/2023  8:41 AM      Passed - AST in normal range and within 360 days    AST  Date Value Ref Range Status  02/04/2023 41 15 - 41 U/L Final         Passed - ALT in normal range and within 360 days    ALT  Date Value Ref Range Status  02/04/2023 43 0 - 44 U/L Final         Passed - Completed PHQ-2 or PHQ-9 in the last 360 days      Passed - Valid encounter within last 6 months    Recent Outpatient Visits           1 month ago Generalized anxiety disorder with panic attacks   [place] [facility] [place], Netta Neat, DO   2 months ago Moderate episode of recurrent major depressive disorder [facility])   [place] [facility] [place], Netta Neat, DO   3 months ago Unintentional weight loss   [place] [facility] [place], Salvadore Oxford, NP   5 months ago Rectal spasm   [place] [facility] Smitty Cords, DO   8 months ago Unintentional weight loss   [facility] [place], Netta Neat, [place]

## 2023-06-30 DIAGNOSIS — F450 Somatization disorder: Secondary | ICD-10-CM | POA: Diagnosis not present

## 2023-06-30 DIAGNOSIS — F321 Major depressive disorder, single episode, moderate: Secondary | ICD-10-CM | POA: Diagnosis not present

## 2023-06-30 DIAGNOSIS — F4001 Agoraphobia with panic disorder: Secondary | ICD-10-CM | POA: Diagnosis not present

## 2023-06-30 DIAGNOSIS — F4521 Hypochondriasis: Secondary | ICD-10-CM | POA: Diagnosis not present

## 2023-06-30 DIAGNOSIS — F40298 Other specified phobia: Secondary | ICD-10-CM | POA: Diagnosis not present

## 2023-07-06 ENCOUNTER — Ambulatory Visit: Payer: 59 | Admitting: Physical Therapy

## 2023-07-06 DIAGNOSIS — K594 Anal spasm: Secondary | ICD-10-CM | POA: Diagnosis not present

## 2023-07-08 ENCOUNTER — Ambulatory Visit: Payer: 59 | Attending: Family Medicine | Admitting: Physical Therapy

## 2023-07-08 ENCOUNTER — Telehealth: Payer: Self-pay | Admitting: Physical Therapy

## 2023-07-08 NOTE — Telephone Encounter (Addendum)
Therapist emailed pt today re: no show appt.  Pt's no- show appt 10/17 today. Pt has been educated about the 2 no show policy with past visits which pt had no showed for 2 x. Today's appt was scheduled because pt expressed interest to return after being explained the no show policy.    10/22 appt is also removed given these series of no show appts that occurred prior to today's scheduled appt.   A link to locate other Pelvic PT was provided  which pt was advised to  work with another pelvic PT and maintain compliance on a consistent basis to gain positive results.

## 2023-07-12 ENCOUNTER — Other Ambulatory Visit: Payer: Self-pay | Admitting: Family Medicine

## 2023-07-12 DIAGNOSIS — F331 Major depressive disorder, recurrent, moderate: Secondary | ICD-10-CM

## 2023-07-12 DIAGNOSIS — F410 Panic disorder [episodic paroxysmal anxiety] without agoraphobia: Secondary | ICD-10-CM

## 2023-07-12 DIAGNOSIS — R634 Abnormal weight loss: Secondary | ICD-10-CM

## 2023-07-13 ENCOUNTER — Ambulatory Visit: Payer: 59 | Admitting: Physical Therapy

## 2023-07-13 NOTE — Telephone Encounter (Signed)
Requested medication (s) are due for refill today: requesting 90 days supply  Requested medication (s) are on the active medication list: yes   Last refill:  05/13/23 #45 5 refills   Future visit scheduled: no   Notes to clinic:  Pharmacy comment: REQUEST FOR 90 DAYS PRESCRIPTION. DX Code Needed.        Requested Prescriptions  Pending Prescriptions Disp Refills   mirtazapine (REMERON) 15 MG tablet [Pharmacy Med Name: MIRTAZAPINE 15 MG TABLET] 135 tablet 2    Sig: Take 1.5 tablets (22.5 mg total) by mouth at bedtime.     Psychiatry: Antidepressants - mirtazapine Passed - 07/12/2023 12:30 PM      Passed - Completed PHQ-2 or PHQ-9 in the last 360 days      Passed - Valid encounter within last 6 months    Recent Outpatient Visits           2 months ago Generalized anxiety disorder with panic attacks   [place] [facility] [place], Netta Neat, DO   3 months ago Moderate episode of recurrent major depressive disorder [facility])   [place] [facility] [place], Netta Neat, DO   3 months ago Unintentional weight loss   [place] [facility] [place], Salvadore Oxford, NP   6 months ago Rectal spasm   [place] Smitty Cords, DO   8 months ago Unintentional weight loss   [facility] [place], Netta Neat, [place]

## 2023-07-21 DIAGNOSIS — F4322 Adjustment disorder with anxiety: Secondary | ICD-10-CM | POA: Diagnosis not present

## 2023-07-21 DIAGNOSIS — F331 Major depressive disorder, recurrent, moderate: Secondary | ICD-10-CM | POA: Diagnosis not present

## 2023-07-27 ENCOUNTER — Encounter: Payer: Self-pay | Admitting: Family Medicine

## 2023-07-27 DIAGNOSIS — G8929 Other chronic pain: Secondary | ICD-10-CM

## 2023-07-27 DIAGNOSIS — K594 Anal spasm: Secondary | ICD-10-CM

## 2023-07-30 DIAGNOSIS — M6289 Other specified disorders of muscle: Secondary | ICD-10-CM | POA: Diagnosis not present

## 2023-08-10 ENCOUNTER — Other Ambulatory Visit: Payer: Self-pay | Admitting: Internal Medicine

## 2023-08-10 DIAGNOSIS — K594 Anal spasm: Secondary | ICD-10-CM | POA: Diagnosis not present

## 2023-08-10 DIAGNOSIS — M6289 Other specified disorders of muscle: Secondary | ICD-10-CM | POA: Diagnosis not present

## 2023-08-11 NOTE — Telephone Encounter (Signed)
Requested by interface surescripts. Medication discontinued 05/13/23.  Requested Prescriptions  Refused Prescriptions Disp Refills   mirtazapine (REMERON) 30 MG tablet [Pharmacy Med Name: MIRTAZAPINE 30 MG TABLET] 30 tablet 0    Sig: TAKE 1 TABLET BY MOUTH AT BEDTIME.     Psychiatry: Antidepressants - mirtazapine Passed - 08/10/2023  1:18 PM      Passed - Completed PHQ-2 or PHQ-9 in the last 360 days      Passed - Valid encounter within last 6 months    Recent Outpatient Visits           3 months ago Generalized anxiety disorder with panic attacks   [place] [facility] [place], Netta Neat, DO   4 months ago Moderate episode of recurrent major depressive disorder [facility])   [place] [facility] [place], Netta Neat, DO   4 months ago Unintentional weight loss   [place] [place], Salvadore Oxford, NP   7 months ago Rectal spasm   [place] [facility] Smitty Cords, DO   9 months ago Unintentional weight loss   [facility] [place], Netta Neat, [place]

## 2023-09-07 DIAGNOSIS — K594 Anal spasm: Secondary | ICD-10-CM | POA: Diagnosis not present

## 2023-09-07 DIAGNOSIS — M6289 Other specified disorders of muscle: Secondary | ICD-10-CM | POA: Diagnosis not present

## 2023-09-27 ENCOUNTER — Encounter: Payer: Self-pay | Admitting: Family Medicine

## 2023-10-12 ENCOUNTER — Other Ambulatory Visit: Payer: Self-pay | Admitting: Family Medicine

## 2023-10-12 DIAGNOSIS — F410 Panic disorder [episodic paroxysmal anxiety] without agoraphobia: Secondary | ICD-10-CM

## 2023-10-12 DIAGNOSIS — F331 Major depressive disorder, recurrent, moderate: Secondary | ICD-10-CM

## 2023-10-12 NOTE — Telephone Encounter (Signed)
Requested Prescriptions  Refused Prescriptions Disp Refills   venlafaxine XR (EFFEXOR-XR) 37.5 MG 24 hr capsule [Pharmacy Med Name: VENLAFAXINE HCL ER 37.5 MG CAP] 30 capsule 5    Sig: TAKE 1 CAPSULE BY MOUTH DAILY WITH BREAKFAST.     Psychiatry: Antidepressants - SNRI - desvenlafaxine & venlafaxine Failed - 10/12/2023 11:54 AM      Failed - Lipid Panel in normal range within the last 12 months    Cholesterol, Total  Date Value Ref Range Status  03/28/2018 168 100 - 199 mg/dL Final   Cholesterol  Date Value Ref Range Status  04/03/2021 219 (H) <200 mg/dL Final   LDL Cholesterol (Calc)  Date Value Ref Range Status  04/03/2021 127 (H) mg/dL (calc) Final    Comment:    Reference range: <100 . Desirable range <100 mg/dL for primary prevention;   <70 mg/dL for patients with CHD or diabetic patients  with > or = 2 CHD risk factors. Marland Kitchen LDL-C is now calculated using the Martin-Hopkins  calculation, which is a validated novel method providing  better accuracy than the Friedewald equation in the  estimation of LDL-C.  Horald Pollen et al. Lenox Ahr. 1610;960(45): 2061-2068  (http://education.[employer].com/faq/FAQ164)    HDL  Date Value Ref Range Status  04/03/2021 66 > OR = 50 mg/dL Final  40/98/1191 79 >47 mg/dL Final   Triglycerides  Date Value Ref Range Status  04/03/2021 151 (H) <150 mg/dL Final         Passed - Cr in normal range and within 360 days    Creat  Date Value Ref Range Status  10/28/2022 0.88 0.50 - 1.03 mg/dL Final   Creatinine, Ser  Date Value Ref Range Status  02/04/2023 0.76 0.44 - 1.00 mg/dL Final         Passed - Completed PHQ-2 or PHQ-9 in the last 360 days      Passed - Last BP in normal range    BP Readings from Last 1 Encounters:  03/22/23 104/62         Passed - Valid encounter within last 6 months    Recent Outpatient Visits           5 months ago Generalized anxiety disorder with panic attacks   [place] [facility]  Smitty Cords, DO   6 months ago Moderate episode of recurrent major depressive disorder [facility])   [place] [facility] [place], Netta Neat, DO   6 months ago Unintentional weight loss   [place] [facility] [place], Salvadore Oxford, NP   9 months ago Rectal spasm   [facility] Smitty Cords, [place]   11 months ago Unintentional weight loss   [facility] [place], Netta Neat, [place]

## 2023-11-17 ENCOUNTER — Encounter: Payer: Self-pay | Admitting: Family Medicine

## 2023-11-19 ENCOUNTER — Encounter: Payer: Self-pay | Admitting: Family Medicine

## 2023-11-19 ENCOUNTER — Telehealth (INDEPENDENT_AMBULATORY_CARE_PROVIDER_SITE_OTHER): Payer: 59 | Admitting: Family Medicine

## 2023-11-19 DIAGNOSIS — F410 Panic disorder [episodic paroxysmal anxiety] without agoraphobia: Secondary | ICD-10-CM

## 2023-11-19 DIAGNOSIS — K6289 Other specified diseases of anus and rectum: Secondary | ICD-10-CM

## 2023-11-19 DIAGNOSIS — G8929 Other chronic pain: Secondary | ICD-10-CM

## 2023-11-19 DIAGNOSIS — F411 Generalized anxiety disorder: Secondary | ICD-10-CM | POA: Diagnosis not present

## 2023-11-19 DIAGNOSIS — K594 Anal spasm: Secondary | ICD-10-CM | POA: Diagnosis not present

## 2023-11-19 DIAGNOSIS — F331 Major depressive disorder, recurrent, moderate: Secondary | ICD-10-CM | POA: Diagnosis not present

## 2023-11-19 DIAGNOSIS — R634 Abnormal weight loss: Secondary | ICD-10-CM | POA: Diagnosis not present

## 2023-11-19 MED ORDER — MIRTAZAPINE 30 MG PO TABS: 30.0000 mg | ORAL_TABLET | Freq: Every day | ORAL | 5 refills | Status: DC

## 2023-11-19 MED ORDER — BUSPIRONE HCL 5 MG PO TABS: 5.0000 mg | ORAL_TABLET | Freq: Three times a day (TID) | ORAL | 3 refills | Status: DC | PRN

## 2023-11-19 MED ORDER — DIAZEPAM 10 MG PO TABS
5.0000 mg | ORAL_TABLET | Freq: Every day | ORAL | 0 refills | Status: DC | PRN
Start: 2023-11-19 — End: 2024-02-10

## 2023-11-19 NOTE — Progress Notes (Signed)
 Subjective:    Patient ID: Lori Davies, female    DOB: 10-Jun-1971, 53 y.o.   MRN: [id number]  Lori Davies is a 53 y.o. female presenting on 11/19/2023 for Anxiety   Virtual / Telehealth Encounter - Video Visit via MyChart The purpose of this virtual visit is to provide medical care while limiting exposure to the novel coronavirus (COVID19) for both patient and office staff.  Consent was obtained for remote visit:  Yes.   Answered questions that patient had about telehealth interaction:  Yes.   I discussed the limitations, risks, security and privacy concerns of performing an evaluation and management service by video/telephone. I also discussed with the patient that there may be a patient responsible charge related to this service. The patient expressed understanding and agreed to proceed.  Patient Location: Home Provider Location: [facility] (Office)  Participants in virtual visit: - Patient: NESTA SCATURRO - CMA: Fuller Plan CMA - Provider: Dr Althea Charon   HPI  Discussed the use of AI scribe software for clinical note transcription with the patient, who gave verbal consent to proceed.  History of Present Illness   Lori Davies is a 53 year old female with anxiety and depression who presents with worsening symptoms despite medication.  Over the past two weeks, she has experienced increased anxiety and depression despite being on mirtazapine, which was previously effective. Her symptoms worsened following family issues. She feels 'really anxious again, like depressed' and lacks motivation to engage in activities such as driving or going out. However, she maintains a routine of walking 10.5 miles daily on her property.  She is currently taking mirtazapine now 30mg  has done well, which was increased by her psychiatrist before she went out of network at the beginning of the year. She is concerned about tapering off mirtazapine due to potential withdrawal  effects and notes that it has helped stabilize her blood pressure, which was previously low. Mirtazapine has also helped her regain weight after a period of not eating due to pain. She has a history of trying multiple medications for her mental health, including bupropion, citalopram, duloxetine, Lexapro, and venlafaxine, which caused gastrointestinal upset. She has also tried Buspar 7.5 in the past, which caused fatigue. She uses Valium as needed for anxiety, particularly during travel to see her grandchildren, and finds it effective in calming her down, but is cautious about regular use to avoid dependency.      11/19/2023    3:25 PM 05/13/2023   11:31 AM 04/01/2023    1:24 PM  Depression screen PHQ 2/9  Decreased Interest 2 0 1  Down, Depressed, Hopeless 1 0 1  PHQ - 2 Score 3 0 2  Altered sleeping 0 0 1  Tired, decreased energy 1 0 2  Change in appetite 0 0 1  Feeling bad or failure about yourself  0 0 0  Trouble concentrating 0 0 1  Moving slowly or fidgety/restless 0 0 0  Suicidal thoughts 0 0 0  PHQ-9 Score 4 0 7  Difficult doing work/chores Somewhat difficult Not difficult at all Somewhat difficult       11/19/2023    3:26 PM 05/13/2023   11:31 AM 04/01/2023    1:24 PM 01/13/2023   11:17 AM  GAD 7 : Generalized Anxiety Score  Nervous, Anxious, on Edge 2 3 2 2   Control/stop worrying 2 2 2 2   Worry too much - different things 2 2 1 2   Trouble relaxing  0 2 1 2   Restless 0 0 2 2  Easily annoyed or irritable 0 0 1 0  Afraid - awful might happen 2 0 1 2  Total GAD 7 Score 8 9 10 12   Anxiety Difficulty Somewhat difficult Somewhat difficult Somewhat difficult     Social History   Tobacco Use   Smoking status: Former    Current packs/day: 0.00    Average packs/day: 0.5 packs/day for 20.0 years (10.0 ttl pk-yrs)    Types: Cigarettes    Start date: 03/21/1998    Quit date: 03/21/2018    Years since quitting: 5.6   Smokeless tobacco: Never  Vaping Use   Vaping status: Every Day    Substances: Nicotine, Flavoring  Substance Use Topics   Alcohol use: No    Alcohol/week: 0.0 standard drinks of alcohol   Drug use: No    Review of Systems Per HPI unless specifically indicated above     Objective:    LMP  (LMP Unknown)   Wt Readings from Last 3 Encounters:  05/13/23 99 lb (44.9 kg)  04/01/23 91 lb 12.8 oz (41.6 kg)  03/22/23 90 lb (40.8 kg)     Physical Exam  Note examination was completely remotely via video observation objective data only  Gen - well-appearing, no acute distress or apparent pain, comfortable HEENT - eyes appear clear without discharge or redness Heart/Lungs - cannot examine virtually - observed no evidence of coughing or labored breathing. Abd - cannot examine virtually  Skin - face visible today- no rash Neuro - awake, alert, oriented Psych - not anxious appearing   Results for orders placed or performed in visit on 04/01/23  Cologuard   Collection Time: 04/14/23  9:30 AM  Result Value Ref Range   COLOGUARD Negative Negative      Assessment & Plan:   Problem List Items Addressed This Visit   None Visit Diagnoses       Generalized anxiety disorder with panic attacks    -  Primary   Relevant Medications   busPIRone (BUSPAR) 5 MG tablet   diazepam (VALIUM) 10 MG tablet   mirtazapine (REMERON) 30 MG tablet     Moderate episode of recurrent major depressive disorder (HCC)       Relevant Medications   busPIRone (BUSPAR) 5 MG tablet   diazepam (VALIUM) 10 MG tablet   mirtazapine (REMERON) 30 MG tablet     Rectal pain, chronic       Relevant Medications   mirtazapine (REMERON) 30 MG tablet     Rectal spasm       Relevant Medications   diazepam (VALIUM) 10 MG tablet     Unintentional weight loss       Relevant Medications   mirtazapine (REMERON) 30 MG tablet         Generalized Anxiety Disorder and Major Depressive Disorder Chronic problem, recent flare up worsening anxiety Long history of trial failure of SSRI  SNRI - see chart. Mirtazapine benefits blood pressure and weight (by increasing appetite and weight maintenance), but mood efficacy may be dose-dependent. Concerned about withdrawal, prefers not to increase dose.   Buspirone considered for non-drowsy profile and anxiety benefit. Diazepam reordered for situational anxiety. - Continue mirtazapine 30 mg at bedtime. - Reorder diazepam 5 mg, 30 pills, as needed for anxiety situational - Initiate buspirone 5 mg, as needed for anxiety, up to three times daily. (Note she failed 7.5mg  in past due to possible side effect, can re-visit this dosing  in future if needed) - Encourage reconnection with mental health services for counseling.  Rectal Pain Ongoing rectal pain contributes to anxiety and depression. Cause undiagnosed and significant distress source. - Encourage follow-up with specialists to determine the cause of rectal pain.  General Health Maintenance Due for a physical examination. - Schedule a physical examination.         No orders of the defined types were placed in this encounter.   Meds ordered this encounter  Medications   busPIRone (BUSPAR) 5 MG tablet    Sig: Take 1 tablet (5 mg total) by mouth 3 (three) times daily as needed (anxiety).    Dispense:  90 tablet    Refill:  3   diazepam (VALIUM) 10 MG tablet    Sig: Take 0.5-1 tablets (5-10 mg total) by mouth daily as needed for anxiety.    Dispense:  30 tablet    Refill:  0   mirtazapine (REMERON) 30 MG tablet    Sig: Take 1 tablet (30 mg total) by mouth at bedtime.    Dispense:  30 tablet    Refill:  5    Dx F33.1    Follow up plan: Return if symptoms worsen or fail to improve.   Patient verbalizes understanding with the above medical recommendations including the limitation of remote medical advice.  Specific follow-up and call-back criteria were given for patient to follow-up or seek medical care more urgently if needed.  Total duration of direct patient care  provided via video conference: 15 minutes   Saralyn Pilar, DO [facility] 11/19/2023, 3:22 PM

## 2023-11-19 NOTE — Patient Instructions (Signed)

## 2023-11-29 DIAGNOSIS — R102 Pelvic and perineal pain: Secondary | ICD-10-CM | POA: Diagnosis not present

## 2023-11-29 DIAGNOSIS — M62838 Other muscle spasm: Secondary | ICD-10-CM | POA: Diagnosis not present

## 2023-11-29 DIAGNOSIS — M6289 Other specified disorders of muscle: Secondary | ICD-10-CM | POA: Diagnosis not present

## 2023-12-14 ENCOUNTER — Other Ambulatory Visit: Payer: Self-pay | Admitting: Family Medicine

## 2023-12-14 DIAGNOSIS — F410 Panic disorder [episodic paroxysmal anxiety] without agoraphobia: Secondary | ICD-10-CM

## 2023-12-15 NOTE — Telephone Encounter (Signed)
 Requested medication (s) are due for refill today: see below  Requested medication (s) are on the active medication list: yes  Last refill:  11/19/23 #90/3  Future visit scheduled: no  Notes to clinic:  Pharmacy comment: REQUEST FOR 90 DAYS PRESCRIPTION. DX Code Needed.      Requested Prescriptions  Pending Prescriptions Disp Refills   busPIRone (BUSPAR) 5 MG tablet [Pharmacy Med Name: BUSPIRONE HCL 5 MG TABLET] 270 tablet 2    Sig: TAKE 1 TABLET (5 MG TOTAL) BY MOUTH 3 (THREE) TIMES DAILY AS NEEDED (ANXIETY).     Psychiatry: Anxiolytics/Hypnotics - Non-controlled Failed - 12/15/2023  3:39 PM      Failed - Valid encounter within last 12 months    Recent Outpatient Visits           3 weeks ago Generalized anxiety disorder with panic attacks   [place] [facility] [place], Netta Neat, [place]

## 2023-12-16 DIAGNOSIS — K6289 Other specified diseases of anus and rectum: Secondary | ICD-10-CM | POA: Diagnosis not present

## 2023-12-16 DIAGNOSIS — F325 Major depressive disorder, single episode, in full remission: Secondary | ICD-10-CM | POA: Diagnosis not present

## 2023-12-16 DIAGNOSIS — M545 Low back pain, unspecified: Secondary | ICD-10-CM | POA: Diagnosis not present

## 2023-12-16 DIAGNOSIS — G588 Other specified mononeuropathies: Secondary | ICD-10-CM | POA: Diagnosis not present

## 2023-12-16 DIAGNOSIS — F411 Generalized anxiety disorder: Secondary | ICD-10-CM | POA: Diagnosis not present

## 2023-12-16 DIAGNOSIS — R102 Pelvic and perineal pain: Secondary | ICD-10-CM | POA: Diagnosis not present

## 2023-12-16 DIAGNOSIS — M62838 Other muscle spasm: Secondary | ICD-10-CM | POA: Diagnosis not present

## 2023-12-16 DIAGNOSIS — R269 Unspecified abnormalities of gait and mobility: Secondary | ICD-10-CM | POA: Diagnosis not present

## 2023-12-16 DIAGNOSIS — Z87891 Personal history of nicotine dependence: Secondary | ICD-10-CM | POA: Diagnosis not present

## 2023-12-28 ENCOUNTER — Encounter: Payer: Self-pay | Admitting: Family Medicine

## 2023-12-29 DIAGNOSIS — F410 Panic disorder [episodic paroxysmal anxiety] without agoraphobia: Secondary | ICD-10-CM | POA: Diagnosis not present

## 2023-12-29 DIAGNOSIS — F341 Dysthymic disorder: Secondary | ICD-10-CM | POA: Diagnosis not present

## 2023-12-31 ENCOUNTER — Encounter: Payer: Self-pay | Admitting: Family Medicine

## 2023-12-31 DIAGNOSIS — F411 Generalized anxiety disorder: Secondary | ICD-10-CM | POA: Diagnosis not present

## 2024-01-04 DIAGNOSIS — K594 Anal spasm: Secondary | ICD-10-CM | POA: Diagnosis not present

## 2024-01-04 DIAGNOSIS — M6289 Other specified disorders of muscle: Secondary | ICD-10-CM | POA: Diagnosis not present

## 2024-01-11 ENCOUNTER — Other Ambulatory Visit: Payer: Self-pay | Admitting: Family Medicine

## 2024-01-11 DIAGNOSIS — F331 Major depressive disorder, recurrent, moderate: Secondary | ICD-10-CM

## 2024-01-11 DIAGNOSIS — R634 Abnormal weight loss: Secondary | ICD-10-CM

## 2024-01-11 DIAGNOSIS — F411 Generalized anxiety disorder: Secondary | ICD-10-CM | POA: Diagnosis not present

## 2024-01-12 NOTE — Telephone Encounter (Signed)
 Patient called and advised it's noted by Dr. Romeo Co in February to return for a physical, she agreed to a morning appointment. Scheduled first available on 02/10/24 at 1020. She asked will she be able to get her anxiety medication, advised I will send in a supply to last until the appointment, she verbalized understanding.   Requested Prescriptions  Pending Prescriptions Disp Refills   mirtazapine  (REMERON ) 30 MG tablet [Pharmacy Med Name: MIRTAZAPINE  30 MG TABLET] 90 tablet 2    Sig: TAKE 1 TABLET BY MOUTH AT BEDTIME.     Psychiatry: Antidepressants - mirtazapine  Failed - 01/12/2024 10:03 AM      Failed - Valid encounter within last 6 months    Recent Outpatient Visits           1 month ago Generalized anxiety disorder with panic attacks   [place] [facility] [place], Kayleen Party, DO              Passed - Completed PHQ-2 or PHQ-9 in the last 360 days

## 2024-01-13 DIAGNOSIS — F410 Panic disorder [episodic paroxysmal anxiety] without agoraphobia: Secondary | ICD-10-CM | POA: Diagnosis not present

## 2024-01-13 DIAGNOSIS — F411 Generalized anxiety disorder: Secondary | ICD-10-CM | POA: Diagnosis not present

## 2024-01-18 DIAGNOSIS — F341 Dysthymic disorder: Secondary | ICD-10-CM | POA: Diagnosis not present

## 2024-01-18 DIAGNOSIS — F410 Panic disorder [episodic paroxysmal anxiety] without agoraphobia: Secondary | ICD-10-CM | POA: Diagnosis not present

## 2024-01-23 ENCOUNTER — Other Ambulatory Visit: Payer: Self-pay | Admitting: Family Medicine

## 2024-01-23 DIAGNOSIS — F331 Major depressive disorder, recurrent, moderate: Secondary | ICD-10-CM

## 2024-01-23 DIAGNOSIS — R634 Abnormal weight loss: Secondary | ICD-10-CM

## 2024-01-25 NOTE — Telephone Encounter (Signed)
 Requested Prescriptions  Refused Prescriptions Disp Refills   mirtazapine  (REMERON ) 15 MG tablet [Pharmacy Med Name: MIRTAZAPINE  15 MG TABLET] 30 tablet 5    Sig: TAKE 1 TABLET BY MOUTH EVERYDAY AT BEDTIME     There is no refill protocol information for this order

## 2024-02-03 ENCOUNTER — Other Ambulatory Visit: Payer: Self-pay | Admitting: Family Medicine

## 2024-02-03 DIAGNOSIS — F410 Panic disorder [episodic paroxysmal anxiety] without agoraphobia: Secondary | ICD-10-CM

## 2024-02-03 DIAGNOSIS — F331 Major depressive disorder, recurrent, moderate: Secondary | ICD-10-CM

## 2024-02-03 DIAGNOSIS — R634 Abnormal weight loss: Secondary | ICD-10-CM

## 2024-02-07 NOTE — Telephone Encounter (Signed)
 Unable to refill per protocol, courtesy refill already given, OV needed for 90 day supply.  Requested Prescriptions  Pending Prescriptions Disp Refills   mirtazapine  (REMERON ) 30 MG tablet [Pharmacy Med Name: MIRTAZAPINE  30 MG TABLET] 90 tablet 1    Sig: TAKE 1 TABLET (30 MG TOTAL) BY MOUTH AT BEDTIME. OFFICE VISIT NEEDED FOR ADDITIONAL REFILLS     Psychiatry: Antidepressants - mirtazapine  Failed - 02/07/2024  9:23 AM      Failed - Valid encounter within last 6 months    Recent Outpatient Visits           2 months ago Generalized anxiety disorder with panic attacks   [place] [facility] [place], Kayleen Party, DO              Passed - Completed PHQ-2 or PHQ-9 in the last 360 days

## 2024-02-10 ENCOUNTER — Encounter: Payer: Self-pay | Admitting: Family Medicine

## 2024-02-10 ENCOUNTER — Ambulatory Visit (INDEPENDENT_AMBULATORY_CARE_PROVIDER_SITE_OTHER): Admitting: Family Medicine

## 2024-02-10 VITALS — BP 108/68 | HR 98 | Ht 66.0 in | Wt 137.4 lb

## 2024-02-10 DIAGNOSIS — Z1231 Encounter for screening mammogram for malignant neoplasm of breast: Secondary | ICD-10-CM | POA: Diagnosis not present

## 2024-02-10 DIAGNOSIS — F331 Major depressive disorder, recurrent, moderate: Secondary | ICD-10-CM | POA: Diagnosis not present

## 2024-02-10 DIAGNOSIS — J432 Centrilobular emphysema: Secondary | ICD-10-CM

## 2024-02-10 DIAGNOSIS — Z0000 Encounter for general adult medical examination without abnormal findings: Secondary | ICD-10-CM | POA: Diagnosis not present

## 2024-02-10 DIAGNOSIS — R300 Dysuria: Secondary | ICD-10-CM | POA: Diagnosis not present

## 2024-02-10 DIAGNOSIS — R634 Abnormal weight loss: Secondary | ICD-10-CM

## 2024-02-10 DIAGNOSIS — F411 Generalized anxiety disorder: Secondary | ICD-10-CM | POA: Diagnosis not present

## 2024-02-10 DIAGNOSIS — K594 Anal spasm: Secondary | ICD-10-CM

## 2024-02-10 DIAGNOSIS — R7309 Other abnormal glucose: Secondary | ICD-10-CM

## 2024-02-10 DIAGNOSIS — F410 Panic disorder [episodic paroxysmal anxiety] without agoraphobia: Secondary | ICD-10-CM

## 2024-02-10 DIAGNOSIS — E782 Mixed hyperlipidemia: Secondary | ICD-10-CM

## 2024-02-10 MED ORDER — MIRTAZAPINE 30 MG PO TABS: 30.0000 mg | ORAL_TABLET | Freq: Every day | ORAL | 11 refills | Status: DC

## 2024-02-10 MED ORDER — DIAZEPAM 10 MG PO TABS: 5.0000 mg | ORAL_TABLET | Freq: Every day | ORAL | 2 refills | Status: AC | PRN

## 2024-02-10 NOTE — Patient Instructions (Addendum)
 Thank you for coming to the office today.  For Mammogram screening for breast cancer   Call the Imaging Center below anytime to schedule your own appointment now that order has been placed.  [facility] [address] [place], [place] [address] Phone: [phone]   Please schedule a Follow-up Appointment to: Return if symptoms worsen or fail to improve.  If you have any other questions or concerns, please feel free to call the office or send a message through MyChart. You may also schedule an earlier appointment if necessary.  Additionally, you may be receiving a survey about your experience at our office within a few days to 1 week by e-mail or mail. We value your feedback.  Domingo Friend, DO [facility], [place]

## 2024-02-10 NOTE — Progress Notes (Signed)
 Subjective:    Patient ID: Lori Davies, female    DOB: Sep 21, 1971, 53 y.o.   MRN: [id number]  Lori Davies is a 53 y.o. female presenting on 02/10/2024 for Annual Exam   HPI  Discussed the use of AI scribe software for clinical note transcription with the patient, who gave verbal consent to proceed.  History of Present Illness   Lori Davies is a 53 year old female who presents for an annual physical exam and medication refills.  She has not yet completed her labs and is considering doing blood work today. She is due for a mammogram and a Pap smear for cervical cancer screening. She completed a Cologuard test last year and is due for colon screening in two years.  She experiences burning during urination and is concerned about a possible urinary tract infection.  She is currently taking Buspar  5 mg three times a day as needed, mirtazapine  30 mg at bedtime, and diazepam . She requests refills for mirtazapine  and diazepam . Her physical therapist suggested checking her hormones due to menopause and potential anxiety related to hormone levels, specifically testosterone and progesterone.  She has declined the shingles vaccine at this time and is aware of the need for a pneumonia shot in the future.       Past Surgical History:  Procedure Laterality Date   ADENOIDECTOMY     BOTOX  INJECTION N/A 08/12/2022   Procedure: BOTOX  INJECTION;  Surgeon: Eldred Grego, MD;  Location: [facility];  Service: General;  Laterality: N/A;   SIGMOIDOSCOPY     XI ROBOT ASSISTED DIAGNOSTIC LAPAROSCOPY N/A 02/08/2023   Procedure: XI ROBOT ASSISTED DIAGNOSTIC LAPAROSCOPY;  Surgeon: Kris Pester, MD;  Location: [facility];  Service: Gynecology;  Laterality: N/A;     Health Maintenance: Mammogram due. Order is in. She will schedule     11/19/2023    3:25 PM 05/13/2023   11:31 AM 04/01/2023    1:24 PM  Depression screen PHQ 2/9  Decreased Interest 2 0 1  Down, Depressed, Hopeless 1 0 1   PHQ - 2 Score 3 0 2  Altered sleeping 0 0 1  Tired, decreased energy 1 0 2  Change in appetite 0 0 1  Feeling bad or failure about yourself  0 0 0  Trouble concentrating 0 0 1  Moving slowly or fidgety/restless 0 0 0  Suicidal thoughts 0 0 0  PHQ-9 Score 4 0 7  Difficult doing work/chores Somewhat difficult Not difficult at all Somewhat difficult       11/19/2023    3:26 PM 05/13/2023   11:31 AM 04/01/2023    1:24 PM 01/13/2023   11:17 AM  GAD 7 : Generalized Anxiety Score  Nervous, Anxious, on Edge 2 3 2 2   Control/stop worrying 2 2 2 2   Worry too much - different things 2 2 1 2   Trouble relaxing 0 2 1 2   Restless 0 0 2 2  Easily annoyed or irritable 0 0 1 0  Afraid - awful might happen 2 0 1 2  Total GAD 7 Score 8 9 10 12   Anxiety Difficulty Somewhat difficult Somewhat difficult Somewhat difficult      Past Medical History:  Diagnosis Date   Anal fissure    Anemia    h/o in teens   Anxiety    Asthma    Centrilobular emphysema (HCC)    Chronic anal fissure    COPD, mild (HCC) 2016   Depression  Ectopic pregnancy 2016   Elevated IgE level    Female pelvic congestion syndrome    GERD (gastroesophageal reflux disease)    occ   Hemorrhoids    History of kidney stones    Hyperinflation of lungs    Internal and external thrombosed hemorrhoids    Panic attacks    Pre-diabetes    Restrictive lung disease    Unintended weight loss    Uterine leiomyoma    Past Surgical History:  Procedure Laterality Date   ADENOIDECTOMY     BOTOX  INJECTION N/A 08/12/2022   Procedure: BOTOX  INJECTION;  Surgeon: Eldred Grego, MD;  Location: [facility];  Service: General;  Laterality: N/A;   SIGMOIDOSCOPY     XI ROBOT ASSISTED DIAGNOSTIC LAPAROSCOPY N/A 02/08/2023   Procedure: XI ROBOT ASSISTED DIAGNOSTIC LAPAROSCOPY;  Surgeon: Kris Pester, MD;  Location: [facility];  Service: Gynecology;  Laterality: N/A;   Social History   Socioeconomic History   Marital status:  Married    Spouse name: Mara Seminole   Number of children: 3   Years of education: Not on file   Highest education level: Some college, no degree  Occupational History   Not on file  Tobacco Use   Smoking status: Former    Current packs/day: 0.00    Average packs/day: 0.5 packs/day for 20.0 years (10.0 ttl pk-yrs)    Types: Cigarettes    Start date: 03/21/1998    Quit date: 03/21/2018    Years since quitting: 5.8   Smokeless tobacco: Never  Vaping Use   Vaping status: Every Day   Substances: Nicotine , Flavoring  Substance and Sexual Activity   Alcohol use: No    Alcohol/week: 0.0 standard drinks of alcohol   Drug use: No   Sexual activity: Yes  Other Topics Concern   Not on file  Social History Narrative   Not on file   Social Drivers of Health   Financial Resource Strain: Low Risk  (11/17/2023)   Overall Financial Resource Strain (CARDIA)    Difficulty of Paying Living Expenses: Not hard at all  Food Insecurity: No Food Insecurity (11/17/2023)   Hunger Vital Sign    Worried About Running Out of Food in the Last Year: Never true    Ran Out of Food in the Last Year: Never true  Transportation Needs: No Transportation Needs (11/17/2023)   PRAPARE - Administrator, Civil Service (Medical): No    Lack of Transportation (Non-Medical): No  Physical Activity: Sufficiently Active (11/17/2023)   Exercise Vital Sign    Days of Exercise per Week: 7 days    Minutes of Exercise per Session: 40 min  Stress: Stress Concern Present (11/17/2023)   [employer] of Occupational Health - Occupational Stress Questionnaire    Feeling of Stress : Rather much  Social Connections: Moderately Integrated (11/17/2023)   Social Connection and Isolation Panel [NHANES]    Frequency of Communication with Friends and Family: More than three times a week    Frequency of Social Gatherings with Friends and Family: Twice a week    Attends Religious Services: 1 to 4 times per year    Active Member  of [employer] or Organizations: No    Attends Banker Meetings: Not on file    Marital Status: Married  Intimate Partner Violence: Unknown (05/28/2022)   Received from [employer], [facility]   HITS    Physically Hurt: Not on file    Insult or Talk Down To: Not  on file    Threaten Physical Harm: Not on file    Scream or Curse: Not on file   Family History  Problem Relation Age of Onset   Heart disease Mother    Lung disease Mother    Heart disease Father    Hypertension Father    Breast cancer Maternal Aunt    Diabetes Maternal Aunt    Autism Son    Colon cancer Neg Hx    Current Outpatient Medications on File Prior to Visit  Medication Sig   busPIRone  (BUSPAR ) 5 MG tablet Take 1 tablet (5 mg total) by mouth 3 (three) times daily as needed (anxiety).   conjugated estrogens (PREMARIN) vaginal cream Place 1 applicator vaginally daily.   hydrocortisone  2.5 % cream Apply topically 2 (two) times daily. For 7 to 10 days per hemorrhoid flare   No current facility-administered medications on file prior to visit.    Review of Systems  Constitutional:  Negative for activity change, appetite change, chills, diaphoresis, fatigue and fever.  HENT:  Negative for congestion and hearing loss.   Eyes:  Negative for visual disturbance.  Respiratory:  Negative for cough, chest tightness, shortness of breath and wheezing.   Cardiovascular:  Negative for chest pain, palpitations and leg swelling.  Gastrointestinal:  Negative for abdominal pain, constipation, diarrhea, nausea and vomiting.  Genitourinary:  Negative for dysuria, frequency and hematuria.  Musculoskeletal:  Negative for arthralgias and neck pain.  Skin:  Negative for rash.  Neurological:  Negative for dizziness, weakness, light-headedness, numbness and headaches.  Hematological:  Negative for adenopathy.  Psychiatric/Behavioral:  Negative for behavioral problems, dysphoric mood and sleep disturbance.    Per HPI unless  specifically indicated above     Objective:     BP 108/68 (BP Location: Right Arm, Patient Position: Sitting, Cuff Size: Normal)   Pulse 98   Ht 5\' 6"  (1.676 m)   Wt 137 lb 6 oz (62.3 kg)   LMP  (LMP Unknown)   SpO2 97%   BMI 22.17 kg/m   Wt Readings from Last 3 Encounters:  02/10/24 137 lb 6 oz (62.3 kg)  05/13/23 99 lb (44.9 kg)  04/01/23 91 lb 12.8 oz (41.6 kg)    Physical Exam Vitals and nursing note reviewed.  Constitutional:      General: She is not in acute distress.    Appearance: She is well-developed. She is not diaphoretic.     Comments: Well-appearing, comfortable, cooperative  HENT:     Head: Normocephalic and atraumatic.  Eyes:     General:        Right eye: No discharge.        Left eye: No discharge.     Conjunctiva/sclera: Conjunctivae normal.     Pupils: Pupils are equal, round, and reactive to light.  Neck:     Thyroid : No thyromegaly.  Cardiovascular:     Rate and Rhythm: Normal rate and regular rhythm.     Pulses: Normal pulses.     Heart sounds: Normal heart sounds. No murmur heard. Pulmonary:     Effort: Pulmonary effort is normal. No respiratory distress.     Breath sounds: Normal breath sounds. No wheezing or rales.  Abdominal:     General: Bowel sounds are normal. There is no distension.     Palpations: Abdomen is soft. There is no mass.     Tenderness: There is no abdominal tenderness.  Musculoskeletal:        General: No tenderness. Normal range  of motion.     Cervical back: Normal range of motion and neck supple.     Right lower leg: No edema.     Left lower leg: No edema.     Comments: Upper / Lower Extremities: - Normal muscle tone, strength bilateral upper extremities 5/5, lower extremities 5/5  Lymphadenopathy:     Cervical: No cervical adenopathy.  Skin:    General: Skin is warm and dry.     Findings: No erythema or rash.  Neurological:     Mental Status: She is alert and oriented to person, place, and time.     Comments:  Distal sensation intact to light touch all extremities  Psychiatric:        Mood and Affect: Mood normal.        Behavior: Behavior normal.        Thought Content: Thought content normal.     Comments: Well groomed, good eye contact, normal speech and thoughts     Results for orders placed or performed in visit on 04/01/23  Cologuard   Collection Time: 04/14/23  9:30 AM  Result Value Ref Range   COLOGUARD Negative Negative      Assessment & Plan:   Problem List Items Addressed This Visit     Centrilobular emphysema (HCC)   Relevant Orders   Lipid panel   Elevated hemoglobin A1c   Relevant Orders   Hemoglobin A1c   Other Visit Diagnoses       Annual physical exam    -  Primary   Relevant Orders   TSH   Lipid panel   Hemoglobin A1c   CBC with Differential/Platelet   Comprehensive metabolic panel with GFR     Encounter for screening mammogram for malignant neoplasm of breast       Relevant Orders   MM 3D SCREENING MAMMOGRAM BILATERAL BREAST     Generalized anxiety disorder with panic attacks       Relevant Medications   mirtazapine  (REMERON ) 30 MG tablet   diazepam  (VALIUM ) 10 MG tablet     Moderate episode of recurrent major depressive disorder (HCC)       Relevant Medications   mirtazapine  (REMERON ) 30 MG tablet   diazepam  (VALIUM ) 10 MG tablet   Other Relevant Orders   CBC with Differential/Platelet   Comprehensive metabolic panel with GFR     Dysuria       Relevant Orders   Urinalysis, Routine w reflex microscopic   Urine Culture     Mixed hyperlipidemia       Relevant Orders   TSH   Lipid panel   Comprehensive metabolic panel with GFR     Unintentional weight loss       Relevant Medications   mirtazapine  (REMERON ) 30 MG tablet     Rectal spasm       Relevant Medications   diazepam  (VALIUM ) 10 MG tablet        Updated Health Maintenance information Fasting labs ordered today Encouraged improvement to lifestyle with diet and  exercise  Anxiety Anxiety possibly linked to post-menopausal hormonal changes. Hormone levels may influence symptoms. - Perform lab tests: thyroid , cholesterol, glucose, chemistry panel, CBC, renal function. - Message in 1-2 weeks to discuss hormone testing, including testosterone and FSH, to be done before 9 AM.  Medication management for depression and anxiety Requires ongoing management for depression and anxiety. Currently on mirtazapine  and diazepam . Buspar  available but not needed. - Refill mirtazapine  30 mg at bedtime, 30-day supply  with multiple refills. - Refill diazepam  with additional refills. - Send prescriptions to [facility].  Chronic Rectal pain Last conversation 12/2023 on mychart. She is following with [facility].  Urinary symptoms Occasional dysuria suggests possible urinary tract infection. - Perform urinalysis to check for urinary tract infection.        Orders Placed This Encounter  Procedures   Urine Culture   MM 3D SCREENING MAMMOGRAM BILATERAL BREAST    Standing Status:   Future    Expiration Date:   02/09/2025    Reason for Exam (SYMPTOM  OR DIAGNOSIS REQUIRED):   Screening bilateral 3D Mammogram Tomo    [facility]erred [facility]   TSH   Lipid panel    Has the patient fasted?:   Yes   Hemoglobin A1c   CBC with Differential/Platelet   Comprehensive metabolic panel with GFR    Has the patient fasted?:   Yes   Urinalysis, Routine w reflex microscopic    Meds ordered this encounter  Medications   mirtazapine  (REMERON ) 30 MG tablet    Sig: Take 1 tablet (30 mg total) by mouth at bedtime. OFFICE VISIT NEEDED FOR ADDITIONAL REFILLS    Dispense:  30 tablet    Refill:  11   diazepam  (VALIUM ) 10 MG tablet    Sig: Take 0.5-1 tablets (5-10 mg total) by mouth daily as needed for anxiety.    Dispense:  30 tablet    Refill:  2     Follow up plan: Return if symptoms worsen or fail to  improve.  Domingo Friend, DO [facility] 02/10/2024, 10:37 AM

## 2024-02-11 LAB — HEMOGLOBIN A1C
Hgb A1c MFr Bld: 6.4 % — ABNORMAL HIGH (ref <5.7)
Mean Plasma Glucose: 137 mg/dL
eAG (mmol/L): 7.6 mmol/L

## 2024-02-11 LAB — COMPREHENSIVE METABOLIC PANEL WITH GFR
AG Ratio: 1.5 (calc) (ref 1.0–2.5)
ALT: 14 U/L (ref 6–29)
AST: 17 U/L (ref 10–35)
Albumin: 4.4 g/dL (ref 3.6–5.1)
Alkaline phosphatase (APISO): 109 U/L (ref 37–153)
BUN: 16 mg/dL (ref 7–25)
CO2: 29 mmol/L (ref 20–32)
Calcium: 10 mg/dL (ref 8.6–10.4)
Chloride: 104 mmol/L (ref 98–110)
Creat: 0.88 mg/dL (ref 0.50–1.03)
Globulin: 3 g/dL (ref 1.9–3.7)
Glucose, Bld: 106 mg/dL — ABNORMAL HIGH (ref 65–99)
Potassium: 4.6 mmol/L (ref 3.5–5.3)
Sodium: 141 mmol/L (ref 135–146)
Total Bilirubin: 0.3 mg/dL (ref 0.2–1.2)
Total Protein: 7.4 g/dL (ref 6.1–8.1)
eGFR: 78 mL/min/{1.73_m2} (ref >=60)

## 2024-02-11 LAB — CBC WITH DIFFERENTIAL/PLATELET
Absolute Lymphocytes: 1433 {cells}/uL (ref 850–3900)
Absolute Monocytes: 547 {cells}/uL (ref 200–950)
Basophils Absolute: 58 {cells}/uL (ref 0–200)
Basophils Relative: 0.8 %
Eosinophils Absolute: 338 {cells}/uL (ref 15–500)
Eosinophils Relative: 4.7 %
HCT: 46.4 % — ABNORMAL HIGH (ref 35.0–45.0)
Hemoglobin: 15.2 g/dL (ref 11.7–15.5)
MCH: 29 pg (ref 27.0–33.0)
MCHC: 32.8 g/dL (ref 32.0–36.0)
MCV: 88.5 fL (ref 80.0–100.0)
MPV: 8.1 fL (ref 7.5–12.5)
Monocytes Relative: 7.6 %
Neutro Abs: 4824 {cells}/uL (ref 1500–7800)
Neutrophils Relative %: 67 %
Platelets: 382 10*3/uL (ref 140–400)
RBC: 5.24 10*6/uL — ABNORMAL HIGH (ref 3.80–5.10)
RDW: 12.5 % (ref 11.0–15.0)
Total Lymphocyte: 19.9 %
WBC: 7.2 10*3/uL (ref 3.8–10.8)

## 2024-02-11 LAB — LIPID PANEL
Cholesterol: 250 mg/dL — ABNORMAL HIGH (ref <200)
HDL: 78 mg/dL (ref >=50)
LDL Cholesterol (Calc): 144 mg/dL — ABNORMAL HIGH
Non-HDL Cholesterol (Calc): 172 mg/dL — ABNORMAL HIGH (ref <130)
Total CHOL/HDL Ratio: 3.2 (calc) (ref <5.0)
Triglycerides: 150 mg/dL — ABNORMAL HIGH (ref <150)

## 2024-02-11 LAB — URINALYSIS, ROUTINE W REFLEX MICROSCOPIC
Bilirubin Urine: NEGATIVE
Glucose, UA: NEGATIVE
Hgb urine dipstick: NEGATIVE
Ketones, ur: NEGATIVE
Leukocytes,Ua: NEGATIVE
Nitrite: NEGATIVE
Protein, ur: NEGATIVE
Specific Gravity, Urine: 1.004 (ref 1.001–1.035)
pH: 6.5 (ref 5.0–8.0)

## 2024-02-11 LAB — URINE CULTURE
MICRO NUMBER:: 16490989
Result:: NO GROWTH
SPECIMEN QUALITY:: ADEQUATE

## 2024-02-11 LAB — TSH: TSH: 2.18 m[IU]/L

## 2024-02-15 ENCOUNTER — Ambulatory Visit: Payer: Self-pay | Admitting: Family Medicine

## 2024-02-24 ENCOUNTER — Other Ambulatory Visit: Payer: Self-pay | Admitting: Family Medicine

## 2024-02-24 DIAGNOSIS — F410 Panic disorder [episodic paroxysmal anxiety] without agoraphobia: Secondary | ICD-10-CM

## 2024-02-25 NOTE — Telephone Encounter (Signed)
 LOV 02/10/2024 for yearly physical  Requested Prescriptions  Pending Prescriptions Disp Refills   busPIRone  (BUSPAR ) 5 MG tablet [Pharmacy Med Name: BUSPIRONE  HCL 5 MG TABLET] 90 tablet 3    Sig: TAKE 1 TABLET (5 MG TOTAL) BY MOUTH 3 (THREE) TIMES DAILY AS NEEDED (ANXIETY).     Psychiatry: Anxiolytics/Hypnotics - Non-controlled Failed - 02/25/2024  9:56 AM      Failed - Valid encounter within last 12 months    Recent Outpatient Visits           2 weeks ago Annual physical exam   [facility] [place], Kayleen Party, DO   3 months ago Generalized anxiety disorder with panic attacks   [facility] [place], Kayleen Party, [place]

## 2024-05-01 ENCOUNTER — Telehealth (INDEPENDENT_AMBULATORY_CARE_PROVIDER_SITE_OTHER): Admitting: Family Medicine

## 2024-05-01 ENCOUNTER — Encounter: Payer: Self-pay | Admitting: Family Medicine

## 2024-05-01 DIAGNOSIS — F410 Panic disorder [episodic paroxysmal anxiety] without agoraphobia: Secondary | ICD-10-CM | POA: Diagnosis not present

## 2024-05-01 DIAGNOSIS — F331 Major depressive disorder, recurrent, moderate: Secondary | ICD-10-CM

## 2024-05-01 DIAGNOSIS — F411 Generalized anxiety disorder: Secondary | ICD-10-CM | POA: Diagnosis not present

## 2024-05-01 MED ORDER — MIRTAZAPINE 15 MG PO TABS: 22.5000 mg | ORAL_TABLET | Freq: Every day | ORAL | 0 refills | Status: DC

## 2024-05-01 NOTE — Progress Notes (Signed)
 Subjective:    Patient ID: Lori Davies, female    DOB: 1970-11-07, 53 y.o.   MRN: [id number]  Lori Davies is a 53 y.o. female presenting on 05/01/2024 for Anxiety   Virtual / Telehealth Encounter - Video Visit via MyChart The purpose of this virtual visit is to provide medical care while limiting exposure to the novel coronavirus (COVID19) for both patient and office staff.  Consent was obtained for remote visit:  Yes.   Answered questions that patient had about telehealth interaction:  Yes.   I discussed the limitations, risks, security and privacy concerns of performing an evaluation and management service by video/telephone. I also discussed with the patient that there may be a patient responsible charge related to this service. The patient expressed understanding and agreed to proceed.  Patient Location: Home Provider Location: [facility] (Office)  Participants in virtual visit: - Patient: Lori Davies - CMA: Alan Fontana CMA - Provider: Dr Edman   HPI  Discussed the use of AI scribe software for clinical note transcription with the patient, who gave verbal consent to proceed.  History of Present Illness   Lori Davies is a 54 year old female who presents with a request to taper off mirtazapine .  Anxiety / Panic Insomnia Depression - Currently taking mirtazapine  30 mg daily - Desires to discontinue mirtazapine  due to 30 lb weight gain over past several months - Prefers a gradual taper to avoid rebound symptoms  Current psychotropic medication use - Currently taking diazepam  and buspirone  (Buspar )      05/01/2024   12:35 PM 11/19/2023    3:25 PM 05/13/2023   11:31 AM  Depression screen PHQ 2/9  Decreased Interest 1 2 0  Down, Depressed, Hopeless 0 1 0  PHQ - 2 Score 1 3 0  Altered sleeping 1 0 0  Tired, decreased energy 1 1 0  Change in appetite 0 0 0  Feeling bad or failure about yourself  0 0 0  Trouble concentrating 0 0 0   Moving slowly or fidgety/restless 0 0 0  Suicidal thoughts 0 0 0  PHQ-9 Score 3 4 0  Difficult doing work/chores Not difficult at all Somewhat difficult Not difficult at all       05/01/2024   12:35 PM 11/19/2023    3:26 PM 05/13/2023   11:31 AM 04/01/2023    1:24 PM  GAD 7 : Generalized Anxiety Score  Nervous, Anxious, on Edge 2 2 3 2   Control/stop worrying 2 2 2 2   Worry too much - different things 2 2 2 1   Trouble relaxing 0 0 2 1  Restless 0 0 0 2  Easily annoyed or irritable 0 0 0 1  Afraid - awful might happen 0 2 0 1  Total GAD 7 Score 6 8 9 10   Anxiety Difficulty Somewhat difficult Somewhat difficult Somewhat difficult Somewhat difficult    Social History   Tobacco Use   Smoking status: Former    Current packs/day: 0.00    Average packs/day: 0.5 packs/day for 20.0 years (10.0 ttl pk-yrs)    Types: Cigarettes    Start date: 03/21/1998    Quit date: 03/21/2018    Years since quitting: 6.1   Smokeless tobacco: Never  Vaping Use   Vaping status: Every Day   Substances: Nicotine , Flavoring  Substance Use Topics   Alcohol use: No    Alcohol/week: 0.0 standard drinks of alcohol   Drug use: No  Review of Systems Per HPI unless specifically indicated above     Objective:    LMP  (LMP Unknown)   Wt Readings from Last 3 Encounters:  02/10/24 137 lb 6 oz (62.3 kg)  05/13/23 99 lb (44.9 kg)  04/01/23 91 lb 12.8 oz (41.6 kg)     Physical Exam  Note examination was completely remotely via video observation objective data only  Gen - well-appearing, no acute distress or apparent pain, comfortable HEENT - eyes appear clear without discharge or redness Heart/Lungs - cannot examine virtually - observed no evidence of coughing or labored breathing. Abd - cannot examine virtually  Skin - face visible today- no rash Neuro - awake, alert, oriented Psych - not anxious appearing   Results for orders placed or performed in visit on 02/10/24  Urine Culture    Collection Time: 02/10/24 10:45 AM   Specimen: Urine  Result Value Ref Range   MICRO NUMBER: [id number]    SPECIMEN QUALITY: Adequate    Sample Source URINE    STATUS: FINAL    Result: No Growth   TSH   Collection Time: 02/10/24 10:45 AM  Result Value Ref Range   TSH 2.18 mIU/L  Lipid panel   Collection Time: 02/10/24 10:45 AM  Result Value Ref Range   Cholesterol 250 (H) <200 mg/dL   HDL 78 > OR = 50 mg/dL   Triglycerides 849 (H) <150 mg/dL   LDL Cholesterol (Calc) 144 (H) mg/dL (calc)   Total CHOL/HDL Ratio 3.2 <5.0 (calc)   Non-HDL Cholesterol (Calc) 172 (H) <130 mg/dL (calc)  Hemoglobin J8r   Collection Time: 02/10/24 10:45 AM  Result Value Ref Range   Hgb A1c MFr Bld 6.4 (H) <5.7 %   Mean Plasma Glucose 137 mg/dL   eAG (mmol/L) 7.6 mmol/L  CBC with Differential/Platelet   Collection Time: 02/10/24 10:45 AM  Result Value Ref Range   WBC 7.2 3.8 - 10.8 Thousand/uL   RBC 5.24 (H) 3.80 - 5.10 Million/uL   Hemoglobin 15.2 11.7 - 15.5 g/dL   HCT 53.5 (H) 64.9 - 54.9 %   MCV 88.5 80.0 - 100.0 fL   MCH 29.0 27.0 - 33.0 pg   MCHC 32.8 32.0 - 36.0 g/dL   RDW 87.4 88.9 - 84.9 %   Platelets 382 140 - 400 Thousand/uL   MPV 8.1 7.5 - 12.5 fL   Neutro Abs 4,824 1,500 - 7,800 cells/uL   Absolute Lymphocytes 1,433 850 - 3,900 cells/uL   Absolute Monocytes 547 200 - 950 cells/uL   Eosinophils Absolute 338 15 - 500 cells/uL   Basophils Absolute 58 0 - 200 cells/uL   Neutrophils Relative % 67 %   Total Lymphocyte 19.9 %   Monocytes Relative 7.6 %   Eosinophils Relative 4.7 %   Basophils Relative 0.8 %  Comprehensive metabolic panel with GFR   Collection Time: 02/10/24 10:45 AM  Result Value Ref Range   Glucose, Bld 106 (H) 65 - 99 mg/dL   BUN 16 7 - 25 mg/dL   Creat 9.11 9.49 - 8.96 mg/dL   eGFR 78 > OR = 60 fO/fpw/8.26f7   BUN/Creatinine Ratio SEE NOTE: 6 - 22 (calc)   Sodium 141 135 - 146 mmol/L   Potassium 4.6 3.5 - 5.3 mmol/L   Chloride 104 98 - 110 mmol/L   CO2 29 20  - 32 mmol/L   Calcium 10.0 8.6 - 10.4 mg/dL   Total Protein 7.4 6.1 - 8.1 g/dL   Albumin 4.4  3.6 - 5.1 g/dL   Globulin 3.0 1.9 - 3.7 g/dL (calc)   AG Ratio 1.5 1.0 - 2.5 (calc)   Total Bilirubin 0.3 0.2 - 1.2 mg/dL   Alkaline phosphatase (APISO) 109 37 - 153 U/L   AST 17 10 - 35 U/L   ALT 14 6 - 29 U/L  Urinalysis, Routine w reflex microscopic   Collection Time: 02/10/24 10:45 AM  Result Value Ref Range   Color, Urine YELLOW YELLOW   APPearance CLEAR CLEAR   Specific Gravity, Urine 1.004 1.001 - 1.035   pH 6.5 5.0 - 8.0   Glucose, UA NEGATIVE NEGATIVE   Bilirubin Urine NEGATIVE NEGATIVE   Ketones, ur NEGATIVE NEGATIVE   Hgb urine dipstick NEGATIVE NEGATIVE   Protein, ur NEGATIVE NEGATIVE   Nitrite NEGATIVE NEGATIVE   Leukocytes,Ua NEGATIVE NEGATIVE      Assessment & Plan:   Problem List Items Addressed This Visit   None Visit Diagnoses       Generalized anxiety disorder with panic attacks    -  Primary   Relevant Medications   mirtazapine  (REMERON ) 15 MG tablet     Moderate episode of recurrent major depressive disorder (HCC)       Relevant Medications   mirtazapine  (REMERON ) 15 MG tablet       Generalized Anxiety Disorder Anxiety managed with mirtazapine . She wishes to discontinue due to elevated weight gain 30 lbs on this medication. She had history of unintentional weight loss before noted and took this med to help. Discussed tapering strategy to prevent withdrawal and rebound anxiety. Recommended gradual taper over four weeks with flexibility. Emphasized consistent dosing and monitoring for adverse effects. Explained risks of abrupt discontinuation. - Order mirtazapine  15 mg tabs with dosing 1.5 = 22.5 mg for tapering. - Alternate 30 mg and 22.5 mg doses for week one. - Take 22.5 mg daily for week two. - Alternate 15 mg and 22.5 mg doses for week three. - Take 15 mg daily for week four. - Continue to taper down, may warrant new rx next month. - Advise to contact  provider if difficulty with tapering or stuck at a dose for more than a month. - Confirm continuation of diazepam  and Buspar  on med rec today       No orders of the defined types were placed in this encounter.   Meds ordered this encounter  Medications   mirtazapine  (REMERON ) 15 MG tablet    Sig: Take 1.5 tablets (22.5 mg total) by mouth at bedtime.    Dispense:  45 tablet    Refill:  0    Follow up plan: Return if symptoms worsen or fail to improve.   Patient verbalizes understanding with the above medical recommendations including the limitation of remote medical advice.  Specific follow-up and call-back criteria were given for patient to follow-up or seek medical care more urgently if needed.  Total duration of direct patient care provided via video conference: 15 minutes   Marsa Officer, DO [facility] 05/01/2024, 11:50 AM

## 2024-05-01 NOTE — Patient Instructions (Addendum)

## 2024-09-12 ENCOUNTER — Other Ambulatory Visit: Payer: Self-pay | Admitting: Family Medicine

## 2024-09-12 DIAGNOSIS — F411 Generalized anxiety disorder: Secondary | ICD-10-CM

## 2024-09-12 DIAGNOSIS — F331 Major depressive disorder, recurrent, moderate: Secondary | ICD-10-CM

## 2024-09-13 NOTE — Telephone Encounter (Signed)
 Requested Prescriptions  Pending Prescriptions Disp Refills   mirtazapine  (REMERON ) 15 MG tablet [Pharmacy Med Name: MIRTAZAPINE  15 MG TABLET] 45 tablet 2    Sig: TAKE 1.5 TABLETS (22.5 MG TOTAL) BY MOUTH AT BEDTIME.     Psychiatry: Antidepressants - mirtazapine  Passed - 09/13/2024  2:24 PM      Passed - Completed PHQ-2 or PHQ-9 in the last 360 days      Passed - Valid encounter within last 6 months    Recent Outpatient Visits           4 months ago Generalized anxiety disorder with panic attacks   [place] [facility] [place], Marsa PARAS, DO   7 months ago Annual physical exam   [place] [facility] [place] Marsa PARAS, DO   9 months ago Generalized anxiety disorder with panic attacks   [place] [facility] [place], Marsa PARAS, [place]

## 2024-10-07 ENCOUNTER — Other Ambulatory Visit: Payer: Self-pay | Admitting: Family Medicine

## 2024-10-07 DIAGNOSIS — F410 Panic disorder [episodic paroxysmal anxiety] without agoraphobia: Secondary | ICD-10-CM

## 2024-10-09 NOTE — Telephone Encounter (Signed)
 Requested Prescriptions  Pending Prescriptions Disp Refills   busPIRone  (BUSPAR ) 5 MG tablet [Pharmacy Med Name: BUSPIRONE  HCL 5 MG TABLET] 270 tablet 0    Sig: TAKE 1 TABLET (5 MG TOTAL) BY MOUTH 3 (THREE) TIMES DAILY AS NEEDED (ANXIETY).     Psychiatry: Anxiolytics/Hypnotics - Non-controlled Passed - 10/09/2024 11:11 AM      Passed - Valid encounter within last 12 months    Recent Outpatient Visits           5 months ago Generalized anxiety disorder with panic attacks   [facility] [place], Marsa PARAS, DO   8 months ago Annual physical exam   [facility] [place] Marsa PARAS, [place]   10 months ago Generalized anxiety disorder with panic attacks   [facility] [place], Marsa PARAS, [place]
